# Patient Record
Sex: Male | Born: 1965 | Race: Black or African American | Hispanic: No | Marital: Single | Smoking: Never smoker
Health system: Southern US, Community
[De-identification: ages and names within clinical notes are randomized; demographics above are authoritative.]

## PROBLEM LIST (undated history)

## (undated) DIAGNOSIS — M25562 Pain in left knee: Secondary | ICD-10-CM

## (undated) DIAGNOSIS — H5440 Blindness, one eye, unspecified eye: Secondary | ICD-10-CM

## (undated) DIAGNOSIS — I10 Essential (primary) hypertension: Secondary | ICD-10-CM

## (undated) DIAGNOSIS — I471 Supraventricular tachycardia, unspecified: Secondary | ICD-10-CM

## (undated) DIAGNOSIS — N186 End stage renal disease: Secondary | ICD-10-CM

## (undated) DIAGNOSIS — E119 Type 2 diabetes mellitus without complications: Secondary | ICD-10-CM

## (undated) DIAGNOSIS — N62 Hypertrophy of breast: Secondary | ICD-10-CM

## (undated) DIAGNOSIS — R0600 Dyspnea, unspecified: Secondary | ICD-10-CM

## (undated) DIAGNOSIS — I4891 Unspecified atrial fibrillation: Secondary | ICD-10-CM

## (undated) DIAGNOSIS — N189 Chronic kidney disease, unspecified: Secondary | ICD-10-CM

## (undated) HISTORY — DX: End stage renal disease: N18.6

## (undated) HISTORY — PX: HERNIA REPAIR: SHX51

## (undated) HISTORY — PX: SINOSCOPY: SHX187

## (undated) HISTORY — DX: Supraventricular tachycardia: I47.1

## (undated) HISTORY — DX: Unspecified atrial fibrillation: I48.91

## (undated) HISTORY — DX: Hypertrophy of breast: N62

## (undated) HISTORY — DX: Chronic kidney disease, unspecified: N18.9

## (undated) HISTORY — DX: Pain in left knee: M25.562

## (undated) HISTORY — DX: Supraventricular tachycardia, unspecified: I47.10

## (undated) HISTORY — PX: EYE SURGERY: SHX253

## (undated) HISTORY — DX: Dyspnea, unspecified: R06.00

## (undated) HISTORY — DX: Type 2 diabetes mellitus without complications: E11.9

## (undated) HISTORY — PX: ADRENALECTOMY: SHX876

---

## 2011-10-21 ENCOUNTER — Other Ambulatory Visit (INDEPENDENT_AMBULATORY_CARE_PROVIDER_SITE_OTHER): Payer: Self-pay | Admitting: Otolaryngology

## 2011-10-30 ENCOUNTER — Ambulatory Visit
Admission: RE | Admit: 2011-10-30 | Discharge: 2011-10-30 | Disposition: A | Discharge status: Home / Self Care | Payer: PRIVATE HEALTH INSURANCE | Source: Ambulatory Visit | Attending: Otolaryngology | Admitting: Otolaryngology

## 2011-11-13 ENCOUNTER — Encounter (HOSPITAL_BASED_OUTPATIENT_CLINIC_OR_DEPARTMENT_OTHER): Payer: Self-pay | Admitting: *Deleted

## 2011-11-13 NOTE — Progress Notes (Signed)
Pt needs bmet-ekg To bring all meds and nebu meds-do neb tx hs prior-

## 2011-11-16 ENCOUNTER — Encounter (HOSPITAL_BASED_OUTPATIENT_CLINIC_OR_DEPARTMENT_OTHER): Payer: Self-pay | Admitting: Anesthesiology

## 2011-11-16 ENCOUNTER — Encounter (HOSPITAL_BASED_OUTPATIENT_CLINIC_OR_DEPARTMENT_OTHER): Payer: Self-pay | Admitting: *Deleted

## 2011-11-16 ENCOUNTER — Encounter (HOSPITAL_BASED_OUTPATIENT_CLINIC_OR_DEPARTMENT_OTHER)
Admission: RE | Disposition: A | Discharge status: Home / Self Care | Payer: Self-pay | Source: Ambulatory Visit | Attending: Otolaryngology

## 2011-11-16 ENCOUNTER — Ambulatory Visit (HOSPITAL_BASED_OUTPATIENT_CLINIC_OR_DEPARTMENT_OTHER)
Admission: RE | Admit: 2011-11-16 | Discharge: 2011-11-16 | Disposition: A | Discharge status: Home / Self Care | Payer: PRIVATE HEALTH INSURANCE | Source: Ambulatory Visit | Attending: Otolaryngology | Admitting: Otolaryngology

## 2011-11-16 ENCOUNTER — Other Ambulatory Visit: Payer: Self-pay

## 2011-11-16 DIAGNOSIS — Z01810 Encounter for preprocedural cardiovascular examination: Secondary | ICD-10-CM | POA: Insufficient documentation

## 2011-11-16 DIAGNOSIS — Z5309 Procedure and treatment not carried out because of other contraindication: Secondary | ICD-10-CM | POA: Insufficient documentation

## 2011-11-16 HISTORY — DX: Blindness, one eye, unspecified eye: H54.40

## 2011-11-16 HISTORY — DX: Essential (primary) hypertension: I10

## 2011-11-16 LAB — POCT I-STAT, CHEM 8
BUN: 16 mg/dL (ref 6–23)
Calcium, Ion: 1.2 mmol/L (ref 1.12–1.32)
Creatinine, Ser: 1.2 mg/dL (ref 0.50–1.35)
TCO2: 28 mmol/L (ref 0–100)

## 2011-11-16 SURGERY — ETHMOIDECTOMY
Anesthesia: General

## 2011-11-16 MED ORDER — SCOPOLAMINE 1 MG/3DAYS TD PT72: 1.0000 | MEDICATED_PATCH | Freq: Once | TRANSDERMAL | Status: DC

## 2011-11-16 MED ORDER — LACTATED RINGERS IV SOLN: INTRAVENOUS | Status: DC

## 2011-11-16 SURGICAL SUPPLY — 49 items
ATTRACTOMAT 16X20 MAGNETIC DRP (DRAPES) IMPLANT
BLADE RAD40 ROTATE 4M 4 5PK (BLADE) IMPLANT
BLADE RAD60 ROTATE M4 4 5PK (BLADE) IMPLANT
BLADE ROTATE RAD12 5PK M4 4MM (BLADE) IMPLANT
BLADE TRICUT ROTATE M4 4 5PK (BLADE) IMPLANT
BUR HS RAD FRONTAL 3 (BURR) IMPLANT
CANISTER SUC SOCK COL 7 IN (MISCELLANEOUS) ×6 IMPLANT
CANISTER SUCTION 1200CC (MISCELLANEOUS) ×3 IMPLANT
CATH SINUS BALLN 7X16 (CATHETERS) IMPLANT
CATH SINUS BALLN RELIEV 6X16 (SINUPLASTY) IMPLANT
CATH SINUS GUIDE F-70 (CATHETERS) IMPLANT
CATH SINUS GUIDE M/110 (CATHETERS) IMPLANT
CATH SINUS IRRIGATION 2.0 (CATHETERS) IMPLANT
CLOTH BEACON ORANGE TIMEOUT ST (SAFETY) ×3 IMPLANT
COAGULATOR SUCT 8FR VV (MISCELLANEOUS) IMPLANT
COAGULATOR SUCT SWTCH 10FR 6 (ELECTROSURGICAL) IMPLANT
DECANTER SPIKE VIAL GLASS SM (MISCELLANEOUS) IMPLANT
DEVICE INFLATION 20/61 (MISCELLANEOUS) IMPLANT
DRSG NASAL KENNEDY LMNT 8CM (GAUZE/BANDAGES/DRESSINGS) IMPLANT
ELECT REM PT RETURN 9FT ADLT (ELECTROSURGICAL)
ELECTRODE REM PT RTRN 9FT ADLT (ELECTROSURGICAL) IMPLANT
GLOVE BIO SURGEON STRL SZ7.5 (GLOVE) ×3 IMPLANT
GOWN PREVENTION PLUS XLARGE (GOWN DISPOSABLE) ×3 IMPLANT
HANDLE SINUS GUIDE LP (INSTRUMENTS) IMPLANT
HANDPIECE HYDRODEBRIDER FRONT (BLADE) IMPLANT
HEMOSTAT SURGICEL 2X14 (HEMOSTASIS) IMPLANT
IV NS 500ML (IV SOLUTION) ×2
IV NS 500ML BAXH (IV SOLUTION) ×2 IMPLANT
NDL SPNL 25GX3.5 QUINCKE BL (NEEDLE) IMPLANT
NEEDLE 27GAX1X1/2 (NEEDLE) ×3 IMPLANT
NEEDLE SPNL 25GX3.5 QUINCKE BL (NEEDLE) IMPLANT
NS IRRIG 1000ML POUR BTL (IV SOLUTION) IMPLANT
PACK BASIN DAY SURGERY FS (CUSTOM PROCEDURE TRAY) ×3 IMPLANT
PACK ENT DAY SURGERY (CUSTOM PROCEDURE TRAY) ×3 IMPLANT
SET EXT MALE ROTATING LL 32IN (MISCELLANEOUS) IMPLANT
SET IV EXT TUBING FEMALE 31 (MISCELLANEOUS) IMPLANT
SLEEVE SCD COMPRESS KNEE MED (MISCELLANEOUS) IMPLANT
SOLUTION BUTLER CLEAR DIP (MISCELLANEOUS) ×6 IMPLANT
SPLINT NASAL DOYLE BI-VL (GAUZE/BANDAGES/DRESSINGS) IMPLANT
SPONGE GAUZE 2X2 8PLY STRL LF (GAUZE/BANDAGES/DRESSINGS) ×3 IMPLANT
SPONGE NEURO XRAY DETECT 1X3 (DISPOSABLE) ×3 IMPLANT
SUT ETHILON 3 0 PS 1 (SUTURE) IMPLANT
SUT PLAIN 4 0 ~~LOC~~ 1 (SUTURE) IMPLANT
SYSTEM HYDRODEBRIDER (MISCELLANEOUS) IMPLANT
SYSTEM RELIEVA LUMA ILLUM (SINUPLASTY) IMPLANT
TOWEL OR 17X24 6PK STRL BLUE (TOWEL DISPOSABLE) ×3 IMPLANT
TUBE CONNECTING 20X1/4 (TUBING) ×3 IMPLANT
WATER STERILE IRR 1000ML POUR (IV SOLUTION) ×3 IMPLANT
YANKAUER SUCT BULB TIP NO VENT (SUCTIONS) IMPLANT

## 2011-11-16 NOTE — Progress Notes (Signed)
Pt. Surgery cancelled due to k level of 2.8. DR. Teoh notified. Iv d/c with tip intact. Site unremarkable. D/c with sister to home

## 2011-11-16 NOTE — Anesthesia Preprocedure Evaluation (Addendum)
Anesthesia Evaluation  Patient identified by MRN, date of birth, ID band Patient awake    Reviewed: Allergy & Precautions, H&P , NPO status , Patient's Chart, lab work & pertinent test results  Airway Mallampati: II TM Distance: >3 FB Neck ROM: full    Dental  (+) Teeth Intact and Chipped   Pulmonary asthma (rarely uses inhalers) ,  clear to auscultation  Pulmonary exam normal       Cardiovascular hypertension (took meds this am), On Medications regular Normal Treadmill test 4-5 years ago-patient reports was fine   Neuro/Psych    GI/Hepatic   Endo/Other  Patient reports he wastes potassium  Renal/GU      Musculoskeletal   Abdominal   Peds  Hematology   Anesthesia Other Findings   Reproductive/Obstetrics                           Anesthesia Physical Anesthesia Plan  ASA: II  Anesthesia Plan:    Post-op Pain Management:    Induction:   Airway Management Planned:   Additional Equipment:   Intra-op Plan:   Post-operative Plan:   Informed Consent: I have reviewed the patients History and Physical, chart, labs and discussed the procedure including the risks, benefits and alternatives for the proposed anesthesia with the patient or authorized representative who has indicated his/her understanding and acceptance.     Plan Discussed with: Surgeon  Anesthesia Plan Comments: (K= 2.8, will postpone pending potassium correction.  Patient to return to his internal medicine doctor.)       Anesthesia Quick Evaluation

## 2012-04-28 DIAGNOSIS — E269 Hyperaldosteronism, unspecified: Secondary | ICD-10-CM | POA: Insufficient documentation

## 2012-07-06 DIAGNOSIS — E876 Hypokalemia: Secondary | ICD-10-CM | POA: Insufficient documentation

## 2012-07-06 DIAGNOSIS — J329 Chronic sinusitis, unspecified: Secondary | ICD-10-CM | POA: Insufficient documentation

## 2012-08-19 DIAGNOSIS — E2601 Conn's syndrome: Secondary | ICD-10-CM | POA: Insufficient documentation

## 2012-08-19 DIAGNOSIS — E2609 Other primary hyperaldosteronism: Secondary | ICD-10-CM | POA: Insufficient documentation

## 2012-09-15 DIAGNOSIS — H02429 Myogenic ptosis of unspecified eyelid: Secondary | ICD-10-CM | POA: Insufficient documentation

## 2012-09-15 DIAGNOSIS — H01009 Unspecified blepharitis unspecified eye, unspecified eyelid: Secondary | ICD-10-CM | POA: Insufficient documentation

## 2012-09-15 DIAGNOSIS — H5440 Blindness, one eye, unspecified eye: Secondary | ICD-10-CM | POA: Insufficient documentation

## 2012-09-30 ENCOUNTER — Other Ambulatory Visit (INDEPENDENT_AMBULATORY_CARE_PROVIDER_SITE_OTHER): Payer: Self-pay | Admitting: Otolaryngology

## 2012-09-30 DIAGNOSIS — J329 Chronic sinusitis, unspecified: Secondary | ICD-10-CM

## 2012-10-06 ENCOUNTER — Ambulatory Visit
Admission: RE | Admit: 2012-10-06 | Discharge: 2012-10-06 | Disposition: A | Discharge status: Home / Self Care | Payer: PRIVATE HEALTH INSURANCE | Source: Ambulatory Visit | Attending: Otolaryngology | Admitting: Otolaryngology

## 2012-10-06 DIAGNOSIS — J329 Chronic sinusitis, unspecified: Secondary | ICD-10-CM

## 2012-10-12 NOTE — Progress Notes (Signed)
Pt surgry cancelled due to low k-he says he is going to the [facility] to get lab-will fax here-he is very hard to talk to-short tempered. Has been kicked out of several medical practices-says he goes to [facility].they are supposed to fax lab here-preop reviewed-to bring all meds and neb dos

## 2012-10-17 ENCOUNTER — Encounter (HOSPITAL_BASED_OUTPATIENT_CLINIC_OR_DEPARTMENT_OTHER): Payer: Self-pay | Admitting: Anesthesiology

## 2012-10-17 ENCOUNTER — Encounter (HOSPITAL_BASED_OUTPATIENT_CLINIC_OR_DEPARTMENT_OTHER)
Admission: RE | Disposition: A | Discharge status: Home / Self Care | Payer: Self-pay | Source: Ambulatory Visit | Attending: Otolaryngology

## 2012-10-17 ENCOUNTER — Ambulatory Visit (HOSPITAL_BASED_OUTPATIENT_CLINIC_OR_DEPARTMENT_OTHER): Payer: PRIVATE HEALTH INSURANCE | Admitting: Anesthesiology

## 2012-10-17 ENCOUNTER — Encounter (HOSPITAL_BASED_OUTPATIENT_CLINIC_OR_DEPARTMENT_OTHER): Payer: Self-pay

## 2012-10-17 ENCOUNTER — Ambulatory Visit (HOSPITAL_BASED_OUTPATIENT_CLINIC_OR_DEPARTMENT_OTHER)
Admission: RE | Admit: 2012-10-17 | Discharge: 2012-10-17 | Disposition: A | Discharge status: Home / Self Care | Payer: PRIVATE HEALTH INSURANCE | Source: Ambulatory Visit | Attending: Otolaryngology | Admitting: Otolaryngology

## 2012-10-17 DIAGNOSIS — J321 Chronic frontal sinusitis: Secondary | ICD-10-CM | POA: Insufficient documentation

## 2012-10-17 DIAGNOSIS — Z9889 Other specified postprocedural states: Secondary | ICD-10-CM

## 2012-10-17 DIAGNOSIS — J338 Other polyp of sinus: Secondary | ICD-10-CM | POA: Insufficient documentation

## 2012-10-17 DIAGNOSIS — J322 Chronic ethmoidal sinusitis: Secondary | ICD-10-CM | POA: Insufficient documentation

## 2012-10-17 DIAGNOSIS — J320 Chronic maxillary sinusitis: Secondary | ICD-10-CM | POA: Insufficient documentation

## 2012-10-17 DIAGNOSIS — J45909 Unspecified asthma, uncomplicated: Secondary | ICD-10-CM | POA: Insufficient documentation

## 2012-10-17 DIAGNOSIS — I10 Essential (primary) hypertension: Secondary | ICD-10-CM | POA: Insufficient documentation

## 2012-10-17 HISTORY — PX: SINUS ENDO WITH FUSION: SHX5329

## 2012-10-17 SURGERY — SURGERY, PARANASAL SINUS, ENDOSCOPIC, WITH NASAL SEPTOPLASTY, TURBINOPLASTY, AND MAXILLARY SINUSOTOMY
Anesthesia: General | Site: Nose | Laterality: Bilateral | Wound class: Clean Contaminated

## 2012-10-17 MED ORDER — NEOSTIGMINE METHYLSULFATE 1 MG/ML IJ SOLN
INTRAMUSCULAR | Status: DC | PRN
  Administered 2012-10-17: 3.5 mg via INTRAVENOUS

## 2012-10-17 MED ORDER — OXYCODONE-ACETAMINOPHEN 5-325 MG PO TABS: 1.0000 | ORAL_TABLET | ORAL | Status: DC | PRN

## 2012-10-17 MED ORDER — ONDANSETRON HCL 4 MG/2ML IJ SOLN: 4.0000 mg | Freq: Once | INTRAMUSCULAR | Status: DC | PRN

## 2012-10-17 MED ORDER — AMOXICILLIN-POT CLAVULANATE 875-125 MG PO TABS: 1.0000 | ORAL_TABLET | Freq: Two times a day (BID) | ORAL | Status: AC

## 2012-10-17 MED ORDER — LIDOCAINE-EPINEPHRINE 1 %-1:100000 IJ SOLN
INTRAMUSCULAR | Status: DC | PRN
  Administered 2012-10-17: 2 mL

## 2012-10-17 MED ORDER — ROCURONIUM BROMIDE 100 MG/10ML IV SOLN
INTRAVENOUS | Status: DC | PRN
  Administered 2012-10-17: 5 mg via INTRAVENOUS
  Administered 2012-10-17: 20 mg via INTRAVENOUS

## 2012-10-17 MED ORDER — OXYCODONE HCL 5 MG/5ML PO SOLN: 5.0000 mg | Freq: Once | ORAL | Status: AC | PRN

## 2012-10-17 MED ORDER — HYDROMORPHONE HCL PF 1 MG/ML IJ SOLN
0.2500 mg | INTRAMUSCULAR | Status: DC | PRN
  Administered 2012-10-17 (×2): 0.5 mg via INTRAVENOUS

## 2012-10-17 MED ORDER — FENTANYL CITRATE 0.05 MG/ML IJ SOLN
INTRAMUSCULAR | Status: DC | PRN
  Administered 2012-10-17: 100 ug via INTRAVENOUS
  Administered 2012-10-17 (×2): 50 ug via INTRAVENOUS

## 2012-10-17 MED ORDER — CEFAZOLIN SODIUM-DEXTROSE 2-3 GM-% IV SOLR
INTRAVENOUS | Status: DC | PRN
  Administered 2012-10-17: 2 g via INTRAVENOUS

## 2012-10-17 MED ORDER — ONDANSETRON HCL 4 MG/2ML IJ SOLN
INTRAMUSCULAR | Status: DC | PRN
  Administered 2012-10-17: 4 mg via INTRAVENOUS

## 2012-10-17 MED ORDER — PREDNISONE (PAK) 10 MG PO TABS: 10.0000 mg | ORAL_TABLET | Freq: Every day | ORAL | Status: AC

## 2012-10-17 MED ORDER — SUCCINYLCHOLINE CHLORIDE 20 MG/ML IJ SOLN
INTRAMUSCULAR | Status: DC | PRN
  Administered 2012-10-17: 50 mg via INTRAVENOUS
  Administered 2012-10-17: 100 mg via INTRAVENOUS

## 2012-10-17 MED ORDER — DEXAMETHASONE SODIUM PHOSPHATE 4 MG/ML IJ SOLN
INTRAMUSCULAR | Status: DC | PRN
  Administered 2012-10-17: 10 mg via INTRAVENOUS
  Administered 2012-10-17: 5 mg via INTRAVENOUS

## 2012-10-17 MED ORDER — PROPOFOL 10 MG/ML IV BOLUS
INTRAVENOUS | Status: DC | PRN
  Administered 2012-10-17: 200 mg via INTRAVENOUS
  Administered 2012-10-17 (×2): 100 mg via INTRAVENOUS
  Administered 2012-10-17 (×2): 50 mg via INTRAVENOUS

## 2012-10-17 MED ORDER — LACTATED RINGERS IV SOLN
INTRAVENOUS | Status: DC
  Administered 2012-10-17 (×2): via INTRAVENOUS

## 2012-10-17 MED ORDER — OXYMETAZOLINE HCL 0.05 % NA SOLN
NASAL | Status: DC | PRN
  Administered 2012-10-17 (×2): 1 via NASAL

## 2012-10-17 MED ORDER — SCOPOLAMINE 1 MG/3DAYS TD PT72: 1.0000 | MEDICATED_PATCH | TRANSDERMAL | Status: DC

## 2012-10-17 MED ORDER — OXYCODONE HCL 5 MG PO TABS
5.0000 mg | ORAL_TABLET | Freq: Once | ORAL | Status: AC | PRN
  Administered 2012-10-17: 5 mg via ORAL

## 2012-10-17 MED ORDER — LIDOCAINE HCL (CARDIAC) 20 MG/ML IV SOLN
INTRAVENOUS | Status: DC | PRN
  Administered 2012-10-17: 100 mg via INTRAVENOUS

## 2012-10-17 MED ORDER — MIDAZOLAM HCL 5 MG/5ML IJ SOLN
INTRAMUSCULAR | Status: DC | PRN
  Administered 2012-10-17: 2 mg via INTRAVENOUS

## 2012-10-17 MED ORDER — GLYCOPYRROLATE 0.2 MG/ML IJ SOLN
INTRAMUSCULAR | Status: DC | PRN
  Administered 2012-10-17: .4 mg via INTRAVENOUS

## 2012-10-17 SURGICAL SUPPLY — 61 items
BLADE ROTATE RAD 12 4 M4 (BLADE) IMPLANT
BLADE ROTATE RAD 40 4 M4 (BLADE) IMPLANT
BLADE ROTATE TRICUT 4X13 M4 (BLADE) ×2 IMPLANT
BLADE TRICUT ROTATE M4 4 5PK (BLADE) IMPLANT
BUR HS RAD FRONTAL 3 (BURR) IMPLANT
CANISTER SUC SOCK COL 7 IN (MISCELLANEOUS) ×3 IMPLANT
CANISTER SUCTION 1200CC (MISCELLANEOUS) ×2 IMPLANT
CATH SINUS BALLN 7X16 (CATHETERS) IMPLANT
CATH SINUS BALLN RELIEV 6X16 (SINUPLASTY) IMPLANT
CATH SINUS GUIDE F-70 (CATHETERS) IMPLANT
CATH SINUS GUIDE M/110 (CATHETERS) IMPLANT
CATH SINUS IRRIGATION 2.0 (CATHETERS) IMPLANT
CLOTH BEACON ORANGE TIMEOUT ST (SAFETY) ×2 IMPLANT
COAGULATOR SUCT 8FR VV (MISCELLANEOUS) IMPLANT
DECANTER SPIKE VIAL GLASS SM (MISCELLANEOUS) IMPLANT
DEVICE INFLATION 20/61 (MISCELLANEOUS) IMPLANT
DRAPE SURG 17X23 STRL (DRAPES) ×2 IMPLANT
DRSG NASAL KENNEDY LMNT 8CM (GAUZE/BANDAGES/DRESSINGS) IMPLANT
DRSG NASOPORE 8CM (GAUZE/BANDAGES/DRESSINGS) ×1 IMPLANT
DRSG TELFA 3X8 NADH (GAUZE/BANDAGES/DRESSINGS) IMPLANT
ELECT REM PT RETURN 9FT ADLT (ELECTROSURGICAL)
ELECTRODE REM PT RTRN 9FT ADLT (ELECTROSURGICAL) IMPLANT
GLOVE BIO SURGEON STRL SZ7.5 (GLOVE) ×2 IMPLANT
GLOVE SKINSENSE NS SZ7.0 (GLOVE) ×1
GLOVE SKINSENSE STRL SZ7.0 (GLOVE) IMPLANT
GOWN PREVENTION PLUS XLARGE (GOWN DISPOSABLE) ×3 IMPLANT
HANDLE SINUS GUIDE LP (INSTRUMENTS) IMPLANT
HANDPIECE HYDRODEBRIDER FRONT (BLADE) IMPLANT
HEMOSTAT SURGICEL 2X14 (HEMOSTASIS) IMPLANT
IV NS 1000ML (IV SOLUTION)
IV NS 1000ML BAXH (IV SOLUTION) IMPLANT
IV NS 500ML (IV SOLUTION) ×2
IV NS 500ML BAXH (IV SOLUTION) ×2 IMPLANT
NDL SPNL 25GX3.5 QUINCKE BL (NEEDLE) IMPLANT
NEEDLE 27GAX1X1/2 (NEEDLE) ×2 IMPLANT
NEEDLE SPNL 25GX3.5 QUINCKE BL (NEEDLE) IMPLANT
NS IRRIG 1000ML POUR BTL (IV SOLUTION) ×1 IMPLANT
PACK BASIN DAY SURGERY FS (CUSTOM PROCEDURE TRAY) ×2 IMPLANT
PACK ENT DAY SURGERY (CUSTOM PROCEDURE TRAY) ×2 IMPLANT
PAD DRESSING TELFA 3X8 NADH (GAUZE/BANDAGES/DRESSINGS) IMPLANT
PAD ENT ADHESIVE 25PK (MISCELLANEOUS) ×2 IMPLANT
SHEATH ENDOSCRUB 0 DEG (SHEATH) IMPLANT
SHEATH ENDOSCRUB 30 DEG (SHEATH) IMPLANT
SLEEVE SCD COMPRESS KNEE MED (MISCELLANEOUS) ×1 IMPLANT
SOLUTION BUTLER CLEAR DIP (MISCELLANEOUS) ×3 IMPLANT
SPLINT NASAL DOYLE BI-VL (GAUZE/BANDAGES/DRESSINGS) IMPLANT
SPONGE GAUZE 2X2 8PLY STRL LF (GAUZE/BANDAGES/DRESSINGS) ×2 IMPLANT
SPONGE NEURO XRAY DETECT 1X3 (DISPOSABLE) ×3 IMPLANT
SUT ETHILON 3 0 PS 1 (SUTURE) IMPLANT
SUT PLAIN 4 0 ~~LOC~~ 1 (SUTURE) IMPLANT
SYR 3ML 23GX1 SAFETY (SYRINGE) IMPLANT
SYSTEM HYDRODEBRIDER (MISCELLANEOUS) IMPLANT
SYSTEM RELIEVA LUMA ILLUM (SINUPLASTY) IMPLANT
TOWEL OR 17X24 6PK STRL BLUE (TOWEL DISPOSABLE) ×2 IMPLANT
TRACKER ENT INSTRUMENT (MISCELLANEOUS) ×2 IMPLANT
TRACKER ENT PATIENT (MISCELLANEOUS) ×2 IMPLANT
TUBE CONNECTING 20X1/4 (TUBING) ×2 IMPLANT
TUBE SALEM SUMP 16 FR W/ARV (TUBING) IMPLANT
TUBING STRAIGHTSHOT EPS 5PK (TUBING) ×2 IMPLANT
WATER STERILE IRR 1000ML POUR (IV SOLUTION) ×1 IMPLANT
YANKAUER SUCT BULB TIP NO VENT (SUCTIONS) ×2 IMPLANT

## 2012-10-17 NOTE — Anesthesia Preprocedure Evaluation (Addendum)
Anesthesia Evaluation  Patient identified by MRN, date of birth, ID band Patient awake    Reviewed: Allergy & Precautions, H&P , NPO status , Patient's Chart, lab work & pertinent test results  Airway Mallampati: I TM Distance: >3 FB Neck ROM: Full    Dental  (+) Teeth Intact and Dental Advisory Given   Pulmonary asthma ,    + wheezing (Left chest wheezing, in no distress.)      Cardiovascular hypertension, Pt. on medications Rhythm:Regular Rate:Normal     Neuro/Psych    GI/Hepatic   Endo/Other  Morbid obesity  Renal/GU      Musculoskeletal   Abdominal   Peds  Hematology   Anesthesia Other Findings   Reproductive/Obstetrics                         Anesthesia Physical Anesthesia Plan  ASA: III  Anesthesia Plan: General   Post-op Pain Management:    Induction: Intravenous  Airway Management Planned: Oral ETT  Additional Equipment:   Intra-op Plan:   Post-operative Plan: Extubation in OR  Informed Consent: I have reviewed the patients History and Physical, chart, labs and discussed the procedure including the risks, benefits and alternatives for the proposed anesthesia with the patient or authorized representative who has indicated his/her understanding and acceptance.   Dental advisory given  Plan Discussed with: CRNA, Anesthesiologist and Surgeon  Anesthesia Plan Comments:         Anesthesia Quick Evaluation

## 2012-10-17 NOTE — Brief Op Note (Signed)
10/17/2012  10:29 AM  PATIENT:  Gary Buchanan  46 y.o. 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  PROCEDURE:  Procedure(s) (LRB) with comments: 1) Bilateral Endoscopic Frontal Recess Exploration with Polyp Removal 2) Bilateral Endoscopic Total Ethmoidectomy 3) Bilateral Endoscopic Maxillary Antrostomy with Polyp Removal 4) Fusion Stereotactic Computer Guidance  SURGEON:  Surgeon(s) and Role:    * Ascencion Dike, MD - Primary  PHYSICIAN ASSISTANT:   ASSISTANTS: none   ANESTHESIA:   general  EBL:  Total I/O In: 1200 [I.V.:1200] Out: 400 [Blood:400]  BLOOD ADMINISTERED:none  DRAINS: none   LOCAL MEDICATIONS USED:  LIDOCAINE   SPECIMEN:  Source of Specimen:  Bilateral sinus polyps  DISPOSITION OF SPECIMEN:  PATHOLOGY  COUNTS:  YES  TOURNIQUET:  * No tourniquets in log *  DICTATION: .Other Dictation: Dictation Number [id number]  PLAN OF CARE: Discharge to home after PACU  PATIENT DISPOSITION:  PACU - hemodynamically stable.   Delay start of Pharmacological VTE agent (>24hrs) due to surgical blood loss or risk of bleeding: not applicable

## 2012-10-17 NOTE — Transfer of Care (Signed)
Immediate Anesthesia Transfer of Care Note  Patient: Gary Buchanan  Procedure(s) Performed: Procedure(s) (LRB) with comments: SINUS ENDO WITH FUSION (Bilateral) - Bilateral Ethmoidectomy,  Bilateral Maxillary Antrostomy, Bilateral Frontal Recess Exploration,   W/ Fusion Protocol  Patient Location: PACU  Anesthesia Type: General  Level of Consciousness: awake  Airway & Oxygen Therapy: Patient Spontanous Breathing and Patient connected to face mask oxygen  Post-op Assessment: Report given to PACU RN and Post -op Vital signs reviewed and stable  Post vital signs: Reviewed and stable  Complications: No apparent anesthesia complications

## 2012-10-17 NOTE — Anesthesia Postprocedure Evaluation (Signed)
  Anesthesia Post-op Note  Patient: Gary Buchanan  Procedure(s) Performed: Procedure(s) (LRB) with comments: SINUS ENDO WITH FUSION (Bilateral) - Bilateral Ethmoidectomy,  Bilateral Maxillary Antrostomy, Bilateral Frontal Recess Exploration,   W/ Fusion Protocol  Patient Location: PACU  Anesthesia Type: General  Level of Consciousness: awake, alert  and oriented  Airway and Oxygen Therapy: Patient Spontanous Breathing and Patient connected to face mask oxygen  Post-op Pain: mild  Post-op Assessment: Post-op Vital signs reviewed  Post-op Vital Signs: Reviewed  Complications: No apparent anesthesia complications

## 2012-10-17 NOTE — H&P (Signed)
  H&P Update  Pt's original H&P dated 09/30/12 reviewed and placed in chart (to be scanned).  I personally examined the patient today.  No change in health. Proceed with bilateral endoscopic sinus surgery.

## 2012-10-17 NOTE — Progress Notes (Signed)
Transportation service arrived to pick pt up to bring to mothers house as per pt. Unable to contact mother but able to contact sister. Sister stated she will be assisting to take care of pt. at home and that  discharge instructions could be reviewed  with her. Discharge instructions reviewed with sister Ginette Pitman verbalized understanding. Pt. discharged via transportation service.

## 2012-10-17 NOTE — Anesthesia Procedure Notes (Signed)
Procedure Name: Intubation Date/Time: 10/17/2012 8:44 AM Performed by: Lieutenant Diego Pre-anesthesia Checklist: Patient identified, Emergency Drugs available, Suction available and Patient being monitored Patient Re-evaluated:Patient Re-evaluated prior to inductionOxygen Delivery Method: Circle System Utilized Preoxygenation: Pre-oxygenation with 100% oxygen Intubation Type: IV induction Ventilation: Mask ventilation without difficulty Laryngoscope Size: Miller and 2 Grade View: Grade I Tube type: Oral Tube size: 8.0 mm Number of attempts: 1 Airway Equipment and Method: stylet and oral airway Placement Confirmation: ETT inserted through vocal cords under direct vision,  positive ETCO2 and breath sounds checked- equal and bilateral Secured at: 23 cm Tube secured with: Tape Dental Injury: Teeth and Oropharynx as per pre-operative assessment

## 2012-10-18 NOTE — Op Note (Addendum)
NAME:  Gary Buchanan, Gary Buchanan NO.:  [id number]  MEDICAL RECORD NO.:  [id number]  LOCATION:                               FACILITY:  [facility]  PHYSICIAN:  Leta Baptist, MD                 DATE OF BIRTH:  DATE OF PROCEDURE:  10/17/2012 DATE OF DISCHARGE:  10/17/2012                              OPERATIVE REPORT   SURGEON:  Leta Baptist, MD  PREOPERATIVE DIAGNOSES: 1. Bilateral chronic maxillary, frontal, and ethmoid sinusitis 2. Bilateral nasal and sinus polyposis.  POSTOPERATIVE DIAGNOSES: 1. Bilateral chronic maxillary, frontal, and ethmoid sinusitis. 2. Bilateral nasal and sinus polyposis.  PROCEDURE PERFORMED: 1. Bilateral endoscopic frontal recess exploration with polyp removal. 2. Bilateral endoscopic total ethmoidectomy 3. Bilateral endoscopic maxillary antrostomy with polyp removal. 4. Fusion stereotactic computer guidance.  ANESTHESIA:  General endotracheal tube anesthesia.  COMPLICATIONS:  None.  ESTIMATED BLOOD LOSS:  400 mL.  INDICATION FOR PROCEDURE:  The patient is a 46 year old male with a history of bilateral chronic rhinosinusitis and nasal polyposis.  He previously underwent bilateral endoscopic sinus surgery twice in [facility].  Since his last surgery, the patient has developed more sinus infections and recurrent nasal polyps.  His CT scan shows a significant opacification of his nasal cavity, bilateral maxillary, ethmoid, and frontal sinuses.  On examination, his nasal cavities were nearly completely obstructed with nasal polyps.  The patient complains of significant difficulty breathing through his nose.  He has been an habitual mouth breather.  The patient was previously treated with maximal medical therapy, including antibiotics, systemic and topical steroids, antihistamine, and decongestants, without significant improvement in his symptoms.  Based on the above findings, the decision was made for the patient to undergo revision, endoscopic sinus  surgery. The risks, benefits, alternatives, and details of the procedures were discussed with the patient.  Questions were invited and answered. Informed consent was obtained.  DESCRIPTION:  The patient was taken to the operating room and placed supine on the operating table.  General endotracheal tube anesthesia was administered by the anesthesiologist.  Preop IV antibiotics and Decadron were given.  The patient was positioned and prepped and draped in the standard fashion for nasal surgery.  Pledgets soaked with Afrin were placed in both nasal cavities for vasoconstriction.  A fusion computer guidance system was then set up in a  standard fashion.  His fiduciary landmark was placed on the forehead.  The registration was performed without any difficulty.  The navigation system was functional throughout the case.  Attention was first focused on the left side.  The pledgets were removed.  Endoscopic evaluation on the left nasal cavity revealed multiple large polyps, nearly completely obstructing the nasal cavity. A 1% lidocaine with 1:100, 000 epinephrine was injected onto the lateral nasal wall and nasal septum.  The polypoid tissue was removed in a piecemeal fashion using Tru-Cut forceps, Blakesley forceps, microdebrider, and backbiter.  The maxillary antrum was then enlarged with backbiter and microdebriders.  A polypoid tissue was also removed from the maxillary sinus.  Mucoid drainage was noted from the maxillary sinus.  The maxillary sinus was copiously irrigated.  Attention was then focused on the ethmoid cavities.  Polypoid tissue was also removed from both anterior and the posterior ethmoid cavities.  The superior nasal wall and lamina papyracea were all noted to be intact.  The frontal recess was then probed with a sinus seeker.  The frontal recess was carefully enlarged with a combination of microdebrider and Blakesley forceps.  Mucoid drainage was noted on the left frontal  sinus.  Polypoid tissue was also removed from the frontal recess area.  The frontal sinus was copiously irrigated.  Hemostasis was achieved with packing soaked with Afrin.  Resorbable Nasopore packing was then used to achieve final hemostasis.  The same procedure was then repeated on the right side without exceptions.  Similar findings, including polypoid tissue in the maxillary, ethmoid, and frontal recess were also noted.  All sinus cavities were copiously irrigated.  Another Nasopore packing was placed. The care of the patient was then turned over to the anesthesiologist. The patient was awakened from anesthesia without difficulty.  He was extubated and transferred to the recovery room in good condition.  OPERATIVE FINDINGS:  Polypoid tissue was noted to obstruct both nasal cavities, bilateral maxillary, ethmoid, and frontal sinuses.  SPECIMEN:  Bilateral sinus contents.  FOLLOWUP CARE:  The patient will be discharged home once he is awake and alert.  He will be placed on Augmentin 875 mg p.o. b.i.d. for 7 days, and prednisone Dosepak for 6 days, and Percocet p.r.n. pain.  The patient will follow up in my office in 1 week for her nasal debridement.     Leta Baptist, MD     ST/MEDQ  D:  10/17/2012  T:  10/17/2012  Job:  [id number]

## 2012-10-19 ENCOUNTER — Encounter (HOSPITAL_BASED_OUTPATIENT_CLINIC_OR_DEPARTMENT_OTHER): Payer: Self-pay | Admitting: Otolaryngology

## 2012-11-30 DIAGNOSIS — B0089 Other herpesviral infection: Secondary | ICD-10-CM | POA: Insufficient documentation

## 2012-11-30 DIAGNOSIS — R252 Cramp and spasm: Secondary | ICD-10-CM | POA: Insufficient documentation

## 2012-11-30 DIAGNOSIS — B009 Herpesviral infection, unspecified: Secondary | ICD-10-CM | POA: Insufficient documentation

## 2013-01-27 DIAGNOSIS — H4720 Unspecified optic atrophy: Secondary | ICD-10-CM | POA: Insufficient documentation

## 2013-01-27 DIAGNOSIS — H47099 Other disorders of optic nerve, not elsewhere classified, unspecified eye: Secondary | ICD-10-CM | POA: Insufficient documentation

## 2013-01-27 DIAGNOSIS — H2510 Age-related nuclear cataract, unspecified eye: Secondary | ICD-10-CM | POA: Insufficient documentation

## 2013-01-27 DIAGNOSIS — H40009 Preglaucoma, unspecified, unspecified eye: Secondary | ICD-10-CM | POA: Insufficient documentation

## 2013-01-27 DIAGNOSIS — H469 Unspecified optic neuritis: Secondary | ICD-10-CM | POA: Insufficient documentation

## 2013-01-31 DIAGNOSIS — D631 Anemia in chronic kidney disease: Secondary | ICD-10-CM | POA: Insufficient documentation

## 2013-01-31 DIAGNOSIS — N183 Chronic kidney disease, stage 3 unspecified: Secondary | ICD-10-CM | POA: Insufficient documentation

## 2013-01-31 DIAGNOSIS — N189 Chronic kidney disease, unspecified: Secondary | ICD-10-CM | POA: Insufficient documentation

## 2013-11-10 DIAGNOSIS — M129 Arthropathy, unspecified: Secondary | ICD-10-CM | POA: Insufficient documentation

## 2013-11-10 DIAGNOSIS — M259 Joint disorder, unspecified: Secondary | ICD-10-CM | POA: Insufficient documentation

## 2014-01-09 DIAGNOSIS — N62 Hypertrophy of breast: Secondary | ICD-10-CM | POA: Insufficient documentation

## 2014-07-23 DIAGNOSIS — D3500 Benign neoplasm of unspecified adrenal gland: Secondary | ICD-10-CM | POA: Insufficient documentation

## 2014-10-02 DIAGNOSIS — M1A3720 Chronic gout due to renal impairment, left ankle and foot, without tophus (tophi): Secondary | ICD-10-CM | POA: Insufficient documentation

## 2014-10-02 DIAGNOSIS — M1030 Gout due to renal impairment, unspecified site: Secondary | ICD-10-CM | POA: Insufficient documentation

## 2014-10-02 DIAGNOSIS — J4531 Mild persistent asthma with (acute) exacerbation: Secondary | ICD-10-CM | POA: Insufficient documentation

## 2014-10-02 DIAGNOSIS — M1A3710 Chronic gout due to renal impairment, right ankle and foot, without tophus (tophi): Secondary | ICD-10-CM | POA: Insufficient documentation

## 2014-10-10 DIAGNOSIS — E291 Testicular hypofunction: Secondary | ICD-10-CM | POA: Insufficient documentation

## 2014-10-10 DIAGNOSIS — E221 Hyperprolactinemia: Secondary | ICD-10-CM | POA: Insufficient documentation

## 2014-11-19 DIAGNOSIS — R252 Cramp and spasm: Secondary | ICD-10-CM | POA: Insufficient documentation

## 2014-12-11 DIAGNOSIS — R635 Abnormal weight gain: Secondary | ICD-10-CM | POA: Insufficient documentation

## 2015-01-07 DIAGNOSIS — F39 Unspecified mood [affective] disorder: Secondary | ICD-10-CM | POA: Insufficient documentation

## 2015-01-10 ENCOUNTER — Encounter (HOSPITAL_BASED_OUTPATIENT_CLINIC_OR_DEPARTMENT_OTHER): Payer: Self-pay | Admitting: Otolaryngology

## 2015-03-05 ENCOUNTER — Other Ambulatory Visit: Payer: Self-pay | Admitting: Nephrology

## 2015-03-05 DIAGNOSIS — N183 Chronic kidney disease, stage 3 unspecified: Secondary | ICD-10-CM

## 2015-03-08 ENCOUNTER — Other Ambulatory Visit: Payer: Medicaid Other

## 2015-03-14 ENCOUNTER — Other Ambulatory Visit: Payer: Medicaid Other

## 2015-03-22 ENCOUNTER — Ambulatory Visit
Admission: RE | Admit: 2015-03-22 | Discharge: 2015-03-22 | Disposition: A | Discharge status: Home / Self Care | Payer: Medicare HMO | Source: Ambulatory Visit | Attending: Nephrology | Admitting: Nephrology

## 2015-03-22 DIAGNOSIS — N183 Chronic kidney disease, stage 3 unspecified: Secondary | ICD-10-CM

## 2016-01-28 DIAGNOSIS — H02409 Unspecified ptosis of unspecified eyelid: Secondary | ICD-10-CM | POA: Insufficient documentation

## 2016-01-28 DIAGNOSIS — H524 Presbyopia: Secondary | ICD-10-CM | POA: Insufficient documentation

## 2016-01-28 DIAGNOSIS — H2510 Age-related nuclear cataract, unspecified eye: Secondary | ICD-10-CM | POA: Insufficient documentation

## 2016-01-28 DIAGNOSIS — H35039 Hypertensive retinopathy, unspecified eye: Secondary | ICD-10-CM | POA: Insufficient documentation

## 2016-07-11 DIAGNOSIS — H40023 Open angle with borderline findings, high risk, bilateral: Secondary | ICD-10-CM | POA: Insufficient documentation

## 2017-01-27 DIAGNOSIS — J4541 Moderate persistent asthma with (acute) exacerbation: Secondary | ICD-10-CM | POA: Insufficient documentation

## 2017-02-04 ENCOUNTER — Ambulatory Visit: Payer: Medicare HMO | Admitting: Family Medicine

## 2017-02-04 NOTE — Progress Notes (Deleted)
[facility] [address], [place], [place] [address] [phone]  Date:  02/04/2017   Name:  Gary Buchanan   DOB:  Mar 24, 1966   MRN:  [id number]  PCP:  No primary care provider on file.    Chief Complaint: No chief complaint on file.   History of Present Illness:  Gary Buchanan is a 50 y.o. very pleasant male patient who presents with the following:  Here today as a new patient- he has some outside notes in the system but this is his first visit with [facility] Reviewed notes in [place] - most recent from 2016 in the [facility] system.   History of   There are no active problems to display for this patient.   Past Medical History:  Diagnosis Date  . Asthma   . Blindness of left eye   . Hypertension   . Shortness of breath   . Sinus congestion     Past Surgical History:  Procedure Laterality Date  . SINUS ENDO WITH FUSION  10/17/2012   Procedure: SINUS ENDO WITH FUSION;  Surgeon: Ascencion Dike, MD;  Location: [facility];  Service: ENT;  Laterality: Bilateral;  Bilateral Ethmoidectomy,  Bilateral Maxillary Antrostomy, Bilateral Frontal Recess Exploration,   W/ Fusion Protocol    Social History  Substance Use Topics  . Smoking status: Former Research scientist (life sciences)  . Smokeless tobacco: Not on file  . Alcohol use No    No family history on file.  Allergies  Allergen Reactions  . Ace Inhibitors Swelling    Medication list has been reviewed and updated.  Current Outpatient Prescriptions on File Prior to Visit  Medication Sig Dispense Refill  . albuterol (PROVENTIL HFA;VENTOLIN HFA) 108 (90 BASE) MCG/ACT inhaler Inhale 2 puffs into the lungs every 6 (six) hours as needed.      Marland Kitchen albuterol (PROVENTIL) (2.5 MG/3ML) 0.083% nebulizer solution Take 2.5 mg by nebulization every 6 (six) hours as needed.      Marland Kitchen amLODipine (NORVASC) 10 MG tablet Take 10 mg by mouth daily.      Marland Kitchen amoxicillin (AMOXIL) 500 MG capsule Take 500 mg by  mouth 3 (three) times daily.      . cloNIDine (CATAPRES) 0.3 MG tablet Take 0.3 mg by mouth 3 (three) times daily.      . hydrALAZINE (APRESOLINE) 50 MG tablet Take 50 mg by mouth 3 (three) times daily.      Marland Kitchen oxyCODONE-acetaminophen (PERCOCET) 5-325 MG per tablet Take 1 tablet by mouth every 4 (four) hours as needed for pain. 25 tablet 0  . potassium chloride (KLOR-CON) 10 MEQ CR tablet Take 10 mEq by mouth 3 (three) times daily.       No current facility-administered medications on file prior to visit.     Review of Systems:  ***  Physical Examination: There were no vitals filed for this visit. There were no vitals filed for this visit. There is no height or weight on file to calculate BMI. Ideal Body Weight:    ***  Assessment and Plan: ***  Signed Lamar Blinks, MD

## 2017-03-23 ENCOUNTER — Ambulatory Visit: Payer: Medicare HMO | Admitting: Cardiovascular Disease

## 2017-04-01 ENCOUNTER — Ambulatory Visit: Payer: Medicaid Other | Admitting: Allergy and Immunology

## 2017-04-08 ENCOUNTER — Ambulatory Visit: Payer: Medicaid Other | Admitting: Cardiovascular Disease

## 2017-04-22 ENCOUNTER — Ambulatory Visit: Payer: Medicaid Other | Admitting: Allergy

## 2017-04-22 ENCOUNTER — Institutional Professional Consult (permissible substitution): Payer: Medicare HMO | Admitting: Internal Medicine

## 2017-05-05 ENCOUNTER — Encounter: Payer: Self-pay | Admitting: Cardiology

## 2017-05-05 ENCOUNTER — Ambulatory Visit: Payer: Medicaid Other | Admitting: Cardiology

## 2017-05-25 ENCOUNTER — Ambulatory Visit: Payer: Medicaid Other | Admitting: Cardiology

## 2017-05-31 ENCOUNTER — Ambulatory Visit: Payer: Medicaid Other | Admitting: Family Medicine

## 2017-06-15 ENCOUNTER — Ambulatory Visit: Payer: Medicaid Other | Admitting: Podiatry

## 2017-06-21 ENCOUNTER — Ambulatory Visit (INDEPENDENT_AMBULATORY_CARE_PROVIDER_SITE_OTHER): Payer: 59 | Admitting: Internal Medicine

## 2017-06-21 ENCOUNTER — Encounter: Payer: Self-pay | Admitting: Internal Medicine

## 2017-06-21 DIAGNOSIS — M109 Gout, unspecified: Secondary | ICD-10-CM | POA: Insufficient documentation

## 2017-06-21 DIAGNOSIS — G4733 Obstructive sleep apnea (adult) (pediatric): Secondary | ICD-10-CM | POA: Insufficient documentation

## 2017-06-21 DIAGNOSIS — J45909 Unspecified asthma, uncomplicated: Secondary | ICD-10-CM | POA: Insufficient documentation

## 2017-06-21 DIAGNOSIS — I10 Essential (primary) hypertension: Secondary | ICD-10-CM | POA: Insufficient documentation

## 2017-06-21 DIAGNOSIS — B009 Herpesviral infection, unspecified: Secondary | ICD-10-CM | POA: Insufficient documentation

## 2017-06-21 DIAGNOSIS — J4540 Moderate persistent asthma, uncomplicated: Secondary | ICD-10-CM | POA: Diagnosis not present

## 2017-06-21 DIAGNOSIS — E118 Type 2 diabetes mellitus with unspecified complications: Secondary | ICD-10-CM | POA: Insufficient documentation

## 2017-06-21 DIAGNOSIS — I4891 Unspecified atrial fibrillation: Secondary | ICD-10-CM | POA: Insufficient documentation

## 2017-06-21 DIAGNOSIS — H5440 Blindness, one eye, unspecified eye: Secondary | ICD-10-CM | POA: Insufficient documentation

## 2017-06-21 MED ORDER — MOMETASONE FURO-FORMOTEROL FUM 200-5 MCG/ACT IN AERO: 2.0000 | INHALATION_SPRAY | Freq: Two times a day (BID) | RESPIRATORY_TRACT | 2 refills | Status: DC

## 2017-06-21 MED ORDER — PREDNISONE 10 MG PO TABS: 10.0000 mg | ORAL_TABLET | Freq: Every day | ORAL | 0 refills | Status: DC

## 2017-06-21 MED ORDER — AMLODIPINE BESYLATE 10 MG PO TABS: 10.0000 mg | ORAL_TABLET | Freq: Every day | ORAL | 0 refills | Status: DC

## 2017-06-21 NOTE — Patient Instructions (Addendum)
It was so nice to meet you!  I have started another inhaler called Dulera. Please use 2 puffs twice a day every day.   We will see you back in 2 weeks to recheck your breathing and to discuss your blood pressure.  -Dr. Brett Albino

## 2017-06-21 NOTE — Progress Notes (Signed)
   [facility] Phone: [phone]  Subjective:  Gary Buchanan is a 51 year old male presenting to clinic for a new patient appointment.  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 No coughing at night. Feeling short of breath all day and night. Endorses shortness of breath with walking and hot temperatures. Using Albuterol nebulizer throughout the day. Taking Budesonide nebulizer bid. Has been hospitalized 4 times this year for asthma.  ROS: No cough, no chest pain, no nausea, no vomiting, no fevers, no chills, no lower extremity edema.  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, HTN, CKD III, gout, OSA, HSV-2  Past Surgical History- Right adrenealectomy 2013, right eyelid surgery 5003, umbilical hernia repair 7048  Medications- Valacyclovir 1g daily, Coreg 6.25mg  bid, Singulair 10mg  daily, Lasix 20mg  daily, Zyrtec 10mg  daily, Albuterol prn, Fluticasone nasal spray 1 spray daily, Budesonide 0.5mg /13ml nebulizer bid, ferrous sulfate 325mg  daily, magnesium 500mg  daily, Januvia 50mg  daily, Clonidine 0.3mg  bid, Naproxen 500mg  daily prn, Aspirin 81mg  daily, Norvasc 10mg  daily, vitamin B6 100mg  daily, Diltazem 180mg  daily, Metoprolol tartrate 25mg  bid  Allergies- ACE inhibitors, Gabapentin, Tizanidine  Family history: - Mother- diabetes, HTN - Sister- diabetes, HTN - Brother- diabetes, HTN  Social history- patient is a never smoker. No alcohol use. No drug use.  Objective: BP (!) 160/80 (BP Location: Left Arm, Patient Position: Sitting, Cuff Size: Normal)   Pulse 64   Temp 97.5 F (36.4 C) (Oral)   Ht 5\' 4"  (1.626 m)   Wt 218 lb 9.6 oz (99.2 kg)   SpO2 93%   BMI 37.52 kg/m  Gen: NAD, alert, cooperative with exam HEENT: NCAT, EOMI, MMM Neck: FROM, supple CV: RRR, no murmur Resp: Normal work of breathing, decreased air movement throughout all lung fields, diffuse expiratory wheezing present, no  crackles Msk: No edema, warm, normal tone, moves UE/LE spontaneously Neuro: Alert and oriented, no gross deficits Skin: No rashes, no lesions Psych: Appropriate behavior  Assessment/Plan: Moderate persistent asthma with exacerbation: Very poorly controlled. Currently using Albuterol neb "all throughout the day". Also using Pulmicort neb bid. Feels short of breath all throughout the day and night. Has been hospitalized 4 times in the last year. 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  Hyman Bible, MD PGY-2

## 2017-06-22 ENCOUNTER — Encounter: Payer: Self-pay | Admitting: Internal Medicine

## 2017-06-22 NOTE — Assessment & Plan Note (Signed)
Very poorly controlled. Currently using Albuterol neb "all throughout the day". Also using Pulmicort neb bid. Feels short of breath all throughout the day and night. Has been hospitalized 4 times in the last year. 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

## 2017-06-23 ENCOUNTER — Encounter: Payer: Self-pay | Admitting: Podiatry

## 2017-06-23 ENCOUNTER — Ambulatory Visit (INDEPENDENT_AMBULATORY_CARE_PROVIDER_SITE_OTHER): Payer: 59 | Admitting: Podiatry

## 2017-06-23 ENCOUNTER — Other Ambulatory Visit: Payer: Self-pay | Admitting: Internal Medicine

## 2017-06-23 VITALS — Ht 64.0 in | Wt 218.0 lb

## 2017-06-23 DIAGNOSIS — M21611 Bunion of right foot: Secondary | ICD-10-CM

## 2017-06-23 DIAGNOSIS — L600 Ingrowing nail: Secondary | ICD-10-CM

## 2017-06-23 DIAGNOSIS — M21612 Bunion of left foot: Secondary | ICD-10-CM

## 2017-06-23 DIAGNOSIS — M79672 Pain in left foot: Secondary | ICD-10-CM

## 2017-06-23 DIAGNOSIS — M21619 Bunion of unspecified foot: Secondary | ICD-10-CM

## 2017-06-23 DIAGNOSIS — B351 Tinea unguium: Secondary | ICD-10-CM

## 2017-06-23 DIAGNOSIS — M79671 Pain in right foot: Secondary | ICD-10-CM

## 2017-06-23 NOTE — Patient Instructions (Signed)
Seen for hypertrophic and painful ingrown nails. No acute infection noted. All nails debrided. Return in 3 months or as needed.

## 2017-06-23 NOTE — Progress Notes (Signed)
SUBJECTIVE: 51 y.o. year old male presents complaining of ingrown nail on left great toe. Having difficulty managing them. 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 This led him to disability. He has had a gout attack a couple of month ago. Stated that his blood sugar is doing fine.   REVIEW OF SYSTEMS: A comprehensive review of systems was negative except for: Poor eye sight and disabled. Periodic gout attack. Cardiac arrythmia prevents him from bending down or doing physical activity.  OBJECTIVE: DERMATOLOGIC EXAMINATION: Nails: Thick dystrophic nails x 10. Ingrown hallucal nails both great toe painful without infection or open skin.  VASCULAR EXAMINATION OF LOWER LIMBS: All pedal pulses are palpable with normal pulsation.  Capillary Filling times within 3 seconds in all digits.  No edema or erythema noted. Temperature gradient from tibial crest to dorsum of foot is within normal bilateral.  NEUROLOGIC EXAMINATION OF THE LOWER LIMBS: Achilles DTR is present and within normal. Monofilament (Semmes-Weinstein 10-gm) sensory testing positive 6 out of 6, bilateral. Vibratory sensations(128Hz  turning fork) intact at medial and lateral forefoot bilateral.  Sharp and Dull discriminatory sensations at the plantar ball of hallux is intact bilateral.   MUSCULOSKELETAL EXAMINATION: Positive for severe HAV with bunion deformity bilateral, painful at times.  ASSESSMENT: HAV with bunion deformity bilateral. Onychocryptosis painful both great toes. Onychomycosis x 10. Pain in both feet.  PLAN: Reviewed clinical findings and available treatment options. All nails debrided. May return in 3 month for palliative care.

## 2017-07-05 ENCOUNTER — Other Ambulatory Visit: Payer: Self-pay | Admitting: Internal Medicine

## 2017-07-07 ENCOUNTER — Other Ambulatory Visit: Payer: Self-pay | Admitting: Internal Medicine

## 2017-07-08 ENCOUNTER — Ambulatory Visit: Payer: Medicaid Other | Admitting: Internal Medicine

## 2017-07-09 ENCOUNTER — Telehealth: Payer: Self-pay | Admitting: *Deleted

## 2017-07-09 MED ORDER — METOPROLOL TARTRATE 25 MG PO TABS: ORAL_TABLET | ORAL | 0 refills | Status: DC

## 2017-07-09 NOTE — Telephone Encounter (Signed)
Pharmacy called and wanted verbal to change med to a 90 day supply.  Pt refuses to pick it up otherwise because it is cheaper for him.  Gave verbal to change and fixed in chart.  Will forward to PCP as FYI. Fleeger, Salome Spotted, CMA

## 2017-07-13 ENCOUNTER — Other Ambulatory Visit: Payer: Self-pay | Admitting: Internal Medicine

## 2017-07-15 DIAGNOSIS — R0602 Shortness of breath: Secondary | ICD-10-CM | POA: Insufficient documentation

## 2017-07-16 DIAGNOSIS — J4541 Moderate persistent asthma with (acute) exacerbation: Secondary | ICD-10-CM | POA: Diagnosis not present

## 2017-07-17 DIAGNOSIS — J4541 Moderate persistent asthma with (acute) exacerbation: Secondary | ICD-10-CM | POA: Diagnosis not present

## 2017-08-25 ENCOUNTER — Other Ambulatory Visit: Payer: Self-pay | Admitting: Internal Medicine

## 2017-08-25 ENCOUNTER — Telehealth: Payer: Self-pay | Admitting: *Deleted

## 2017-08-25 DIAGNOSIS — I4891 Unspecified atrial fibrillation: Secondary | ICD-10-CM

## 2017-08-25 NOTE — Telephone Encounter (Signed)
Referral placed to Cardiology for A-fib. I agree that he does need to be seen in clinic for breathing problems because he does also have asthma. Thanks for trying to get him an appointment.

## 2017-08-25 NOTE — Progress Notes (Signed)
ref

## 2017-08-25 NOTE — Telephone Encounter (Signed)
Patient called requesting a referral to a specialist for A-Fib. He stated he can not walk a long time without stopping due to shortness of breath. Patient advised he needed to be seen in clinic for the breathing problems and follow up for A-Fib. Patient declined appointment and just want the referral. Phone number on file is correct.  Derl Barrow, RN

## 2017-08-31 ENCOUNTER — Other Ambulatory Visit: Payer: Self-pay | Admitting: Internal Medicine

## 2017-09-02 ENCOUNTER — Other Ambulatory Visit: Payer: Self-pay | Admitting: Internal Medicine

## 2017-09-02 DIAGNOSIS — J4541 Moderate persistent asthma with (acute) exacerbation: Secondary | ICD-10-CM | POA: Diagnosis not present

## 2017-09-02 DIAGNOSIS — R0602 Shortness of breath: Secondary | ICD-10-CM | POA: Diagnosis not present

## 2017-09-02 DIAGNOSIS — J4550 Severe persistent asthma, uncomplicated: Secondary | ICD-10-CM

## 2017-09-02 MED ORDER — SITAGLIPTIN PHOSPHATE 50 MG PO TABS: 50.0000 mg | ORAL_TABLET | Freq: Every day | ORAL | 0 refills | Status: DC

## 2017-09-02 MED ORDER — AMLODIPINE BESYLATE 10 MG PO TABS: 10.0000 mg | ORAL_TABLET | Freq: Every day | ORAL | 0 refills | Status: DC

## 2017-09-02 NOTE — Telephone Encounter (Signed)
Pt called and would like to have a referral to be seen by a pulmonary doctor that is male. He doesn't care where it is at he will go. He also needs refills on his amlodipine and Januvia to be called in. Blima Rich

## 2017-09-02 NOTE — Telephone Encounter (Signed)
Will forward to MD. Jazmin Hartsell,CMA  

## 2017-09-02 NOTE — Telephone Encounter (Signed)
Referral placed. Refills sent.

## 2017-09-02 NOTE — Telephone Encounter (Signed)
Pt contacted and VM was left informing of medication refills and referral.

## 2017-09-10 ENCOUNTER — Other Ambulatory Visit: Payer: Self-pay | Admitting: Internal Medicine

## 2017-09-23 ENCOUNTER — Ambulatory Visit: Payer: 59 | Admitting: Podiatry

## 2017-09-23 DIAGNOSIS — H6523 Chronic serous otitis media, bilateral: Secondary | ICD-10-CM | POA: Insufficient documentation

## 2017-09-30 ENCOUNTER — Encounter: Payer: Self-pay | Admitting: *Deleted

## 2017-10-04 ENCOUNTER — Other Ambulatory Visit: Payer: Self-pay | Admitting: Internal Medicine

## 2017-10-07 ENCOUNTER — Ambulatory Visit: Payer: 59 | Admitting: Podiatry

## 2017-10-11 ENCOUNTER — Telehealth: Payer: Self-pay | Admitting: *Deleted

## 2017-10-11 ENCOUNTER — Other Ambulatory Visit: Payer: Self-pay | Admitting: Internal Medicine

## 2017-10-11 ENCOUNTER — Encounter: Payer: Self-pay | Admitting: Cardiology

## 2017-10-11 ENCOUNTER — Ambulatory Visit (INDEPENDENT_AMBULATORY_CARE_PROVIDER_SITE_OTHER): Payer: 59 | Admitting: Cardiology

## 2017-10-11 VITALS — BP 124/60 | HR 64 | Ht 64.0 in | Wt 225.0 lb

## 2017-10-11 DIAGNOSIS — J45901 Unspecified asthma with (acute) exacerbation: Secondary | ICD-10-CM

## 2017-10-11 DIAGNOSIS — R072 Precordial pain: Secondary | ICD-10-CM | POA: Diagnosis not present

## 2017-10-11 DIAGNOSIS — R079 Chest pain, unspecified: Secondary | ICD-10-CM | POA: Insufficient documentation

## 2017-10-11 DIAGNOSIS — I10 Essential (primary) hypertension: Secondary | ICD-10-CM

## 2017-10-11 DIAGNOSIS — I509 Heart failure, unspecified: Secondary | ICD-10-CM | POA: Diagnosis not present

## 2017-10-11 DIAGNOSIS — R0602 Shortness of breath: Secondary | ICD-10-CM | POA: Diagnosis not present

## 2017-10-11 DIAGNOSIS — N183 Chronic kidney disease, stage 3 unspecified: Secondary | ICD-10-CM

## 2017-10-11 MED ORDER — DILTIAZEM HCL ER COATED BEADS 240 MG PO CP24: 240.0000 mg | ORAL_CAPSULE | Freq: Every day | ORAL | 1 refills | Status: DC

## 2017-10-11 MED ORDER — FUROSEMIDE 40 MG PO TABS: 40.0000 mg | ORAL_TABLET | Freq: Every day | ORAL | 2 refills | Status: DC

## 2017-10-11 MED ORDER — PREDNISONE 20 MG PO TABS: 40.0000 mg | ORAL_TABLET | Freq: Every day | ORAL | 0 refills | Status: DC
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
## 2017-10-11 NOTE — Telephone Encounter (Signed)
-----   Message from Dorothy Spark, MD sent at 10/11/2017  5:27 PM EDT ----- Abnormal kidney function and BNP elevated consistent with CHF, please repeat BMP and BNP at the next visit.

## 2017-10-11 NOTE — Telephone Encounter (Signed)
Notified the pt that per Dr Meda Coffee, his labs showed abnormal kidney function and BNP elevated with consistent CHF, and she recommends that we repeat a BMET and PRO-BNP same day as he comes back to see the PA on 11/03/17.  Advised the pt to arrive a few mins before his scheduled appt with the PA, to have his labs done.  Pt verbalized understanding and agrees with this plan.

## 2017-10-11 NOTE — Patient Instructions (Signed)
Medication Instructions:   STOP TAKING METOPROLOL NOW  INCREASE YOUR CARDIZEM CD TO 240 MG ONCE DAILY  INCREASE YOUR LASIX TO 40 MG ONCE DAILY  START TAKING PREDNISONE 40 MG BY MOUTH DAILY FOR 5 DAYS ONLY--TAKE THIS WITH BREAKFAST    Labwork:  TODAY--CMET, CBC W DIFF, TSH, PRO-BNP, AND LIPIDS     Testing/Procedures:  Your physician has requested that you have an echocardiogram. Echocardiography is a painless test that uses sound waves to create images of your heart. It provides your doctor with information about the size and shape of your heart and how well your heart's chambers and valves are working. This procedure takes approximately one hour. There are no restrictions for this procedure.    You have been referred to PULMONOLOGY FOR SEVERE ASTHMA AND SOB     Follow-Up:  [place] PA-C OR BRITTANY SIMMONS PA-C      If you need a refill on your cardiac medications before your next appointment, please call your pharmacy.

## 2017-10-12 ENCOUNTER — Telehealth: Payer: Self-pay | Admitting: *Deleted

## 2017-10-12 LAB — COMPREHENSIVE METABOLIC PANEL
ALT: 19 IU/L (ref 0–44)
AST: 20 IU/L (ref 0–40)
Albumin/Globulin Ratio: 1.8 (ref 1.2–2.2)
Albumin: 4.6 g/dL (ref 3.5–5.5)
Alkaline Phosphatase: 103 IU/L (ref 39–117)
BUN/Creatinine Ratio: 15 (ref 9–20)
BUN: 33 mg/dL — ABNORMAL HIGH (ref 6–24)
Bilirubin Total: 0.3 mg/dL (ref 0.0–1.2)
CO2: 21 mmol/L (ref 20–29)
Calcium: 11.5 mg/dL — ABNORMAL HIGH (ref 8.7–10.2)
Chloride: 104 mmol/L (ref 96–106)
Creatinine, Ser: 2.24 mg/dL — ABNORMAL HIGH (ref 0.76–1.27)
GFR calc Af Amer: 37 mL/min/{1.73_m2} — ABNORMAL LOW (ref >59)
GFR calc non Af Amer: 32 mL/min/{1.73_m2} — ABNORMAL LOW (ref >59)
Globulin, Total: 2.6 g/dL (ref 1.5–4.5)
Glucose: 111 mg/dL — ABNORMAL HIGH (ref 65–99)
Potassium: 4 mmol/L (ref 3.5–5.2)
Sodium: 142 mmol/L (ref 134–144)
Total Protein: 7.2 g/dL (ref 6.0–8.5)

## 2017-10-12 LAB — CBC WITH DIFFERENTIAL/PLATELET
Basophils Absolute: 0.1 10*3/uL (ref 0.0–0.2)
Basos: 0 %
EOS (ABSOLUTE): 0.6 10*3/uL — ABNORMAL HIGH (ref 0.0–0.4)
Eos: 4 %
Hematocrit: 39 % (ref 37.5–51.0)
Hemoglobin: 13.3 g/dL (ref 13.0–17.7)
Immature Grans (Abs): 0.1 10*3/uL (ref 0.0–0.1)
Immature Granulocytes: 1 %
Lymphocytes Absolute: 2.3 10*3/uL (ref 0.7–3.1)
Lymphs: 17 %
MCH: 28.2 pg (ref 26.6–33.0)
MCHC: 34.1 g/dL (ref 31.5–35.7)
MCV: 83 fL (ref 79–97)
Monocytes Absolute: 1.5 10*3/uL — ABNORMAL HIGH (ref 0.1–0.9)
Monocytes: 11 %
Neutrophils Absolute: 9.2 10*3/uL — ABNORMAL HIGH (ref 1.4–7.0)
Neutrophils: 67 %
Platelets: 267 10*3/uL (ref 150–379)
RBC: 4.72 x10E6/uL (ref 4.14–5.80)
RDW: 16.7 % — ABNORMAL HIGH (ref 12.3–15.4)
WBC: 13.6 10*3/uL — ABNORMAL HIGH (ref 3.4–10.8)

## 2017-10-12 LAB — PRO B NATRIURETIC PEPTIDE: NT-Pro BNP: 251 pg/mL — ABNORMAL HIGH (ref 0–121)

## 2017-10-12 LAB — LIPID PANEL
Chol/HDL Ratio: 4.8 ratio (ref 0.0–5.0)
Cholesterol, Total: 184 mg/dL (ref 100–199)
HDL: 38 mg/dL — ABNORMAL LOW (ref >39)
LDL Calculated: 130 mg/dL — ABNORMAL HIGH (ref 0–99)
Triglycerides: 79 mg/dL (ref 0–149)
VLDL Cholesterol Cal: 16 mg/dL (ref 5–40)

## 2017-10-12 LAB — TSH: TSH: 0.69 u[IU]/mL (ref 0.450–4.500)

## 2017-10-12 NOTE — Telephone Encounter (Signed)
-----   Message from Dorothy Spark, MD sent at 10/12/2017  9:00 AM EDT ----- TSH normal

## 2017-10-12 NOTE — Telephone Encounter (Signed)
Patient informed. 

## 2017-10-19 ENCOUNTER — Ambulatory Visit: Payer: 59 | Admitting: Podiatry

## 2017-10-20 ENCOUNTER — Other Ambulatory Visit (HOSPITAL_COMMUNITY): Payer: 59

## 2017-10-25 ENCOUNTER — Other Ambulatory Visit: Payer: Self-pay

## 2017-10-25 ENCOUNTER — Ambulatory Visit (HOSPITAL_COMMUNITY): Payer: 59 | Attending: Cardiology

## 2017-10-25 DIAGNOSIS — G4733 Obstructive sleep apnea (adult) (pediatric): Secondary | ICD-10-CM | POA: Insufficient documentation

## 2017-10-25 DIAGNOSIS — R0602 Shortness of breath: Secondary | ICD-10-CM

## 2017-10-25 DIAGNOSIS — N183 Chronic kidney disease, stage 3 (moderate): Secondary | ICD-10-CM | POA: Diagnosis not present

## 2017-10-25 DIAGNOSIS — I509 Heart failure, unspecified: Secondary | ICD-10-CM | POA: Insufficient documentation

## 2017-10-25 DIAGNOSIS — I10 Essential (primary) hypertension: Secondary | ICD-10-CM | POA: Diagnosis not present

## 2017-10-25 DIAGNOSIS — J45901 Unspecified asthma with (acute) exacerbation: Secondary | ICD-10-CM | POA: Diagnosis not present

## 2017-10-25 DIAGNOSIS — Z87891 Personal history of nicotine dependence: Secondary | ICD-10-CM | POA: Insufficient documentation

## 2017-10-25 DIAGNOSIS — I130 Hypertensive heart and chronic kidney disease with heart failure and stage 1 through stage 4 chronic kidney disease, or unspecified chronic kidney disease: Secondary | ICD-10-CM | POA: Diagnosis not present

## 2017-10-25 DIAGNOSIS — E1122 Type 2 diabetes mellitus with diabetic chronic kidney disease: Secondary | ICD-10-CM | POA: Diagnosis not present

## 2017-10-25 DIAGNOSIS — R072 Precordial pain: Secondary | ICD-10-CM | POA: Diagnosis not present

## 2017-10-28 ENCOUNTER — Ambulatory Visit (INDEPENDENT_AMBULATORY_CARE_PROVIDER_SITE_OTHER): Payer: Medicare Other | Admitting: Podiatry

## 2017-10-28 ENCOUNTER — Encounter: Payer: Self-pay | Admitting: Podiatry

## 2017-10-28 DIAGNOSIS — E118 Type 2 diabetes mellitus with unspecified complications: Secondary | ICD-10-CM | POA: Diagnosis not present

## 2017-10-28 DIAGNOSIS — B351 Tinea unguium: Secondary | ICD-10-CM

## 2017-10-28 DIAGNOSIS — L600 Ingrowing nail: Secondary | ICD-10-CM

## 2017-10-28 NOTE — Progress Notes (Signed)
Subjective: 51 y.o. year old male patient presents complaining of painful nails. Patient requests toe nails trimmed.  Type II diabettic, last glucose reading was 111 on 10/11/17.  HPI: Poor eye sight and disabled. Periodic gout attack. Cardiac arrythmia prevents him from bending down or doing physical activity. Patient is diagnosed with stage III Kidney disease and CHF.  Objective: Dermatologic: Thick yellow deformed nails x 10. Vascular: Pedal pulses are all palpable. Orthopedic: Severe hallux valgus with bunion deformities bilateral. Neurologic: All epicritic and tactile sensations grossly intact.  Assessment: Dystrophic mycotic nails x 10. Painful feet.  Treatment: All mycotic nails debrided.  Return in 3 months or as needed.

## 2017-10-28 NOTE — Patient Instructions (Signed)
Seen for hypertrophic nails. All nails debrided. Return in 3 months or as needed.  

## 2017-11-01 ENCOUNTER — Other Ambulatory Visit: Payer: Self-pay | Admitting: Internal Medicine

## 2017-11-02 ENCOUNTER — Institutional Professional Consult (permissible substitution): Payer: 59 | Admitting: Pulmonary Disease

## 2017-11-03 ENCOUNTER — Ambulatory Visit: Payer: 59 | Admitting: Cardiology

## 2017-11-03 ENCOUNTER — Other Ambulatory Visit: Payer: 59

## 2017-11-04 ENCOUNTER — Other Ambulatory Visit: Payer: Self-pay | Admitting: Internal Medicine

## 2017-11-04 ENCOUNTER — Telehealth: Payer: Self-pay | Admitting: Internal Medicine

## 2017-11-04 MED ORDER — CLONIDINE HCL 0.1 MG PO TABS: 0.1000 mg | ORAL_TABLET | Freq: Two times a day (BID) | ORAL | 0 refills | Status: DC

## 2017-11-04 NOTE — Telephone Encounter (Signed)
LM for patient on identified VM that we are unable to fill his script since he is no longer a patient of ours.  Per his chart Dr. Maia Petties is now his PCP and also he has seen another family medicine office in care everywhere too.  He was only seen in our office once. Jazmin Hartsell,CMA

## 2017-11-04 NOTE — Telephone Encounter (Signed)
Needs refill for clonadine.  He was told by his pharmacy it had been denied.  Please send it to [facility] at [place]

## 2017-11-04 NOTE — Telephone Encounter (Signed)
Patient had requested a refill of his Clonidine. On chart review, his PCP had been changed to a different provider and patient has had multiple recent visits at [facility]. It was thought that patient had changed PCPs, so I declined his medication refill. Spoke with patient over the phone and he stated that he would like to continue to be seen in our clinic. I asked him to call to schedule an appointment since he has not been seen since 05/2017. I have also refilled his Clonidine. Patient voiced understanding.  Hyman Bible, MD PGY-3

## 2017-11-05 ENCOUNTER — Institutional Professional Consult (permissible substitution): Payer: 59 | Admitting: Internal Medicine

## 2017-11-05 ENCOUNTER — Other Ambulatory Visit: Payer: Self-pay | Admitting: Internal Medicine

## 2017-11-06 ENCOUNTER — Other Ambulatory Visit: Payer: Self-pay | Admitting: Internal Medicine

## 2017-11-07 ENCOUNTER — Other Ambulatory Visit: Payer: Self-pay | Admitting: Internal Medicine

## 2017-11-07 ENCOUNTER — Other Ambulatory Visit: Payer: Self-pay | Admitting: Cardiology

## 2017-11-07 DIAGNOSIS — I10 Essential (primary) hypertension: Secondary | ICD-10-CM

## 2017-11-07 DIAGNOSIS — R072 Precordial pain: Secondary | ICD-10-CM

## 2017-11-07 DIAGNOSIS — J45901 Unspecified asthma with (acute) exacerbation: Secondary | ICD-10-CM

## 2017-11-07 DIAGNOSIS — R0602 Shortness of breath: Secondary | ICD-10-CM

## 2017-11-09 ENCOUNTER — Encounter (HOSPITAL_BASED_OUTPATIENT_CLINIC_OR_DEPARTMENT_OTHER): Payer: Self-pay | Admitting: Emergency Medicine

## 2017-11-09 ENCOUNTER — Other Ambulatory Visit: Payer: Self-pay

## 2017-11-09 ENCOUNTER — Inpatient Hospital Stay (HOSPITAL_BASED_OUTPATIENT_CLINIC_OR_DEPARTMENT_OTHER)
Admission: EM | Admit: 2017-11-09 | Discharge: 2017-11-11 | DRG: 202 | Disposition: A | Discharge status: Home / Self Care | Payer: Medicare Other | Attending: Internal Medicine | Admitting: Internal Medicine

## 2017-11-09 ENCOUNTER — Emergency Department (HOSPITAL_BASED_OUTPATIENT_CLINIC_OR_DEPARTMENT_OTHER): Payer: Medicare Other

## 2017-11-09 DIAGNOSIS — I129 Hypertensive chronic kidney disease with stage 1 through stage 4 chronic kidney disease, or unspecified chronic kidney disease: Secondary | ICD-10-CM | POA: Diagnosis present

## 2017-11-09 DIAGNOSIS — I5189 Other ill-defined heart diseases: Secondary | ICD-10-CM | POA: Diagnosis not present

## 2017-11-09 DIAGNOSIS — N183 Chronic kidney disease, stage 3 unspecified: Secondary | ICD-10-CM | POA: Diagnosis present

## 2017-11-09 DIAGNOSIS — H5462 Unqualified visual loss, left eye, normal vision right eye: Secondary | ICD-10-CM | POA: Diagnosis present

## 2017-11-09 DIAGNOSIS — Z888 Allergy status to other drugs, medicaments and biological substances status: Secondary | ICD-10-CM

## 2017-11-09 DIAGNOSIS — E1122 Type 2 diabetes mellitus with diabetic chronic kidney disease: Secondary | ICD-10-CM | POA: Diagnosis present

## 2017-11-09 DIAGNOSIS — Z91040 Latex allergy status: Secondary | ICD-10-CM | POA: Diagnosis not present

## 2017-11-09 DIAGNOSIS — Z7951 Long term (current) use of inhaled steroids: Secondary | ICD-10-CM

## 2017-11-09 DIAGNOSIS — E118 Type 2 diabetes mellitus with unspecified complications: Secondary | ICD-10-CM | POA: Diagnosis not present

## 2017-11-09 DIAGNOSIS — R0602 Shortness of breath: Secondary | ICD-10-CM | POA: Diagnosis not present

## 2017-11-09 DIAGNOSIS — J4541 Moderate persistent asthma with (acute) exacerbation: Secondary | ICD-10-CM | POA: Diagnosis not present

## 2017-11-09 DIAGNOSIS — E2609 Other primary hyperaldosteronism: Secondary | ICD-10-CM | POA: Diagnosis not present

## 2017-11-09 DIAGNOSIS — M1A9XX0 Chronic gout, unspecified, without tophus (tophi): Secondary | ICD-10-CM | POA: Diagnosis not present

## 2017-11-09 DIAGNOSIS — N189 Chronic kidney disease, unspecified: Secondary | ICD-10-CM | POA: Diagnosis present

## 2017-11-09 DIAGNOSIS — J4551 Severe persistent asthma with (acute) exacerbation: Secondary | ICD-10-CM

## 2017-11-09 DIAGNOSIS — Z7984 Long term (current) use of oral hypoglycemic drugs: Secondary | ICD-10-CM | POA: Diagnosis not present

## 2017-11-09 DIAGNOSIS — R0600 Dyspnea, unspecified: Secondary | ICD-10-CM | POA: Diagnosis not present

## 2017-11-09 DIAGNOSIS — Z7952 Long term (current) use of systemic steroids: Secondary | ICD-10-CM | POA: Diagnosis not present

## 2017-11-09 DIAGNOSIS — I4891 Unspecified atrial fibrillation: Secondary | ICD-10-CM | POA: Diagnosis not present

## 2017-11-09 DIAGNOSIS — Z7982 Long term (current) use of aspirin: Secondary | ICD-10-CM

## 2017-11-09 DIAGNOSIS — M1A3720 Chronic gout due to renal impairment, left ankle and foot, without tophus (tophi): Secondary | ICD-10-CM | POA: Diagnosis not present

## 2017-11-09 DIAGNOSIS — R062 Wheezing: Secondary | ICD-10-CM | POA: Diagnosis not present

## 2017-11-09 DIAGNOSIS — Z79899 Other long term (current) drug therapy: Secondary | ICD-10-CM | POA: Diagnosis not present

## 2017-11-09 DIAGNOSIS — I10 Essential (primary) hypertension: Secondary | ICD-10-CM | POA: Diagnosis present

## 2017-11-09 DIAGNOSIS — J45901 Unspecified asthma with (acute) exacerbation: Secondary | ICD-10-CM | POA: Diagnosis present

## 2017-11-09 DIAGNOSIS — D631 Anemia in chronic kidney disease: Secondary | ICD-10-CM | POA: Diagnosis present

## 2017-11-09 DIAGNOSIS — H5440 Blindness, one eye, unspecified eye: Secondary | ICD-10-CM | POA: Diagnosis present

## 2017-11-09 DIAGNOSIS — J9600 Acute respiratory failure, unspecified whether with hypoxia or hypercapnia: Secondary | ICD-10-CM | POA: Diagnosis present

## 2017-11-09 LAB — CBC WITH DIFFERENTIAL/PLATELET
BASOS PCT: 1 %
Basophils Absolute: 0.1 10*3/uL (ref 0.0–0.1)
EOS ABS: 1.1 10*3/uL — AB (ref 0.0–0.7)
EOS PCT: 9 %
HCT: 40.3 % (ref 39.0–52.0)
HEMOGLOBIN: 13.4 g/dL (ref 13.0–17.0)
Lymphocytes Relative: 18 %
Lymphs Abs: 2.4 10*3/uL (ref 0.7–4.0)
MCH: 27.5 pg (ref 26.0–34.0)
MCHC: 33.3 g/dL (ref 30.0–36.0)
MCV: 82.6 fL (ref 78.0–100.0)
Monocytes Absolute: 0.5 10*3/uL (ref 0.1–1.0)
Monocytes Relative: 4 %
NEUTROS PCT: 68 %
Neutro Abs: 9 10*3/uL — ABNORMAL HIGH (ref 1.7–7.7)
PLATELETS: 281 10*3/uL (ref 150–400)
RBC: 4.88 MIL/uL (ref 4.22–5.81)
RDW: 16.7 % — ABNORMAL HIGH (ref 11.5–15.5)
WBC: 13.1 10*3/uL — AB (ref 4.0–10.5)

## 2017-11-09 LAB — BASIC METABOLIC PANEL
Anion gap: 4 — ABNORMAL LOW (ref 5–15)
BUN: 30 mg/dL — AB (ref 6–20)
CHLORIDE: 107 mmol/L (ref 101–111)
CO2: 25 mmol/L (ref 22–32)
CREATININE: 2.06 mg/dL — AB (ref 0.61–1.24)
Calcium: 10.4 mg/dL — ABNORMAL HIGH (ref 8.9–10.3)
GFR, EST AFRICAN AMERICAN: 41 mL/min — AB (ref >60)
GFR, EST NON AFRICAN AMERICAN: 35 mL/min — AB (ref >60)
Glucose, Bld: 106 mg/dL — ABNORMAL HIGH (ref 65–99)
POTASSIUM: 4.2 mmol/L (ref 3.5–5.1)
SODIUM: 136 mmol/L (ref 135–145)

## 2017-11-09 LAB — TROPONIN I

## 2017-11-09 LAB — GLUCOSE, CAPILLARY: Glucose-Capillary: 204 mg/dL — ABNORMAL HIGH (ref 65–99)

## 2017-11-09 MED ORDER — FLUTICASONE PROPIONATE 50 MCG/ACT NA SUSP
1.0000 | Freq: Every day | NASAL | Status: DC
  Administered 2017-11-11: 1 via NASAL
  Filled 2017-11-09: qty 16

## 2017-11-09 MED ORDER — MAGNESIUM GLUCONATE 500 MG PO TABS
500.0000 mg | ORAL_TABLET | Freq: Every day | ORAL | Status: DC
  Administered 2017-11-10 – 2017-11-11 (×2): 500 mg via ORAL
  Filled 2017-11-09 (×2): qty 1

## 2017-11-09 MED ORDER — FUROSEMIDE 40 MG PO TABS
40.0000 mg | ORAL_TABLET | Freq: Every day | ORAL | Status: DC
Start: 2017-11-10 — End: 2017-11-11
  Administered 2017-11-10: 40 mg via ORAL
  Filled 2017-11-09 (×2): qty 1

## 2017-11-09 MED ORDER — ONDANSETRON HCL 4 MG PO TABS: 4.0000 mg | ORAL_TABLET | Freq: Four times a day (QID) | ORAL | Status: DC | PRN

## 2017-11-09 MED ORDER — ALBUTEROL SULFATE (2.5 MG/3ML) 0.083% IN NEBU: 2.5000 mg | INHALATION_SOLUTION | RESPIRATORY_TRACT | Status: DC | PRN

## 2017-11-09 MED ORDER — MONTELUKAST SODIUM 10 MG PO TABS
10.0000 mg | ORAL_TABLET | Freq: Every day | ORAL | Status: DC
  Administered 2017-11-10 (×2): 10 mg via ORAL
  Filled 2017-11-09 (×2): qty 1

## 2017-11-09 MED ORDER — FERROUS SULFATE 325 (65 FE) MG PO TABS
325.0000 mg | ORAL_TABLET | Freq: Every day | ORAL | Status: DC
  Administered 2017-11-10 – 2017-11-11 (×2): 325 mg via ORAL
  Filled 2017-11-09 (×3): qty 1

## 2017-11-09 MED ORDER — ALBUTEROL (5 MG/ML) CONTINUOUS INHALATION SOLN
INHALATION_SOLUTION | RESPIRATORY_TRACT | Status: AC
  Administered 2017-11-09: 15 mg
  Filled 2017-11-09: qty 3

## 2017-11-09 MED ORDER — BUDESONIDE 0.5 MG/2ML IN SUSP
0.5000 mg | Freq: Two times a day (BID) | RESPIRATORY_TRACT | Status: DC
  Administered 2017-11-10 (×2): 0.5 mg via RESPIRATORY_TRACT
  Filled 2017-11-09 (×3): qty 2

## 2017-11-09 MED ORDER — IPRATROPIUM BROMIDE 0.02 % IN SOLN
RESPIRATORY_TRACT | Status: AC
  Administered 2017-11-09: 0.5 mg
  Filled 2017-11-09: qty 2.5

## 2017-11-09 MED ORDER — IPRATROPIUM-ALBUTEROL 0.5-2.5 (3) MG/3ML IN SOLN: 3.0000 mL | Freq: Four times a day (QID) | RESPIRATORY_TRACT | Status: DC

## 2017-11-09 MED ORDER — TRAVOPROST 0.004 % OP SOLN: 1.0000 [drp] | Freq: Every day | OPHTHALMIC | Status: DC

## 2017-11-09 MED ORDER — ASPIRIN EC 81 MG PO TBEC
81.0000 mg | DELAYED_RELEASE_TABLET | Freq: Every day | ORAL | Status: DC
  Administered 2017-11-10 – 2017-11-11 (×2): 81 mg via ORAL
  Filled 2017-11-09 (×2): qty 1

## 2017-11-09 MED ORDER — DILTIAZEM HCL ER COATED BEADS 240 MG PO CP24
240.0000 mg | ORAL_CAPSULE | Freq: Every day | ORAL | Status: DC
  Administered 2017-11-10: 240 mg via ORAL
  Filled 2017-11-09: qty 1

## 2017-11-09 MED ORDER — MAGNESIUM SULFATE 2 GM/50ML IV SOLN
2.0000 g | Freq: Once | INTRAVENOUS | Status: AC
  Administered 2017-11-09: 2 g via INTRAVENOUS
  Filled 2017-11-09: qty 50

## 2017-11-09 MED ORDER — LORATADINE 10 MG PO TABS
10.0000 mg | ORAL_TABLET | Freq: Every day | ORAL | Status: DC
  Administered 2017-11-10 – 2017-11-11 (×2): 10 mg via ORAL
  Filled 2017-11-09 (×2): qty 1

## 2017-11-09 MED ORDER — INSULIN ASPART 100 UNIT/ML ~~LOC~~ SOLN: 0.0000 [IU] | Freq: Three times a day (TID) | SUBCUTANEOUS | Status: DC

## 2017-11-09 MED ORDER — HEPARIN SODIUM (PORCINE) 5000 UNIT/ML IJ SOLN
5000.0000 [IU] | Freq: Three times a day (TID) | INTRAMUSCULAR | Status: DC
  Administered 2017-11-10 – 2017-11-11 (×3): 5000 [IU] via SUBCUTANEOUS
  Filled 2017-11-09 (×4): qty 1

## 2017-11-09 MED ORDER — TIMOLOL HEMIHYDRATE 0.25 % OP SOLN: 1.0000 [drp] | Freq: Two times a day (BID) | OPHTHALMIC | Status: DC

## 2017-11-09 MED ORDER — ALLOPURINOL 300 MG PO TABS
300.0000 mg | ORAL_TABLET | Freq: Every day | ORAL | Status: DC
  Administered 2017-11-10: 300 mg via ORAL
  Filled 2017-11-09: qty 1

## 2017-11-09 MED ORDER — ALBUTEROL (5 MG/ML) CONTINUOUS INHALATION SOLN
15.0000 mg/h | INHALATION_SOLUTION | RESPIRATORY_TRACT | Status: AC
  Administered 2017-11-09: 15 mg/h via RESPIRATORY_TRACT
  Filled 2017-11-09: qty 20

## 2017-11-09 MED ORDER — ACETAMINOPHEN 650 MG RE SUPP: 650.0000 mg | Freq: Four times a day (QID) | RECTAL | Status: DC | PRN

## 2017-11-09 MED ORDER — LATANOPROST 0.005 % OP SOLN
1.0000 [drp] | Freq: Every day | OPHTHALMIC | Status: DC
  Administered 2017-11-10 (×2): 1 [drp] via OPHTHALMIC
  Filled 2017-11-09: qty 2.5

## 2017-11-09 MED ORDER — AMLODIPINE BESYLATE 10 MG PO TABS
10.0000 mg | ORAL_TABLET | Freq: Every day | ORAL | Status: DC
  Administered 2017-11-10: 10 mg via ORAL
  Filled 2017-11-09: qty 1

## 2017-11-09 MED ORDER — ALBUTEROL SULFATE (2.5 MG/3ML) 0.083% IN NEBU
2.5000 mg | INHALATION_SOLUTION | RESPIRATORY_TRACT | Status: DC
  Administered 2017-11-10 – 2017-11-11 (×7): 2.5 mg via RESPIRATORY_TRACT
  Filled 2017-11-09 (×8): qty 3

## 2017-11-09 MED ORDER — ALBUTEROL SULFATE (2.5 MG/3ML) 0.083% IN NEBU
2.5000 mg | INHALATION_SOLUTION | RESPIRATORY_TRACT | Status: DC | PRN
  Administered 2017-11-11: 2.5 mg via RESPIRATORY_TRACT
  Filled 2017-11-09: qty 3

## 2017-11-09 MED ORDER — ACETAMINOPHEN 325 MG PO TABS: 650.0000 mg | ORAL_TABLET | Freq: Four times a day (QID) | ORAL | Status: DC | PRN

## 2017-11-09 MED ORDER — VITAMIN B-6 100 MG PO TABS
100.0000 mg | ORAL_TABLET | Freq: Every day | ORAL | Status: DC
Start: 2017-11-10 — End: 2017-11-11
  Administered 2017-11-10 – 2017-11-11 (×2): 100 mg via ORAL
  Filled 2017-11-09 (×2): qty 1

## 2017-11-09 MED ORDER — TIMOLOL MALEATE 0.25 % OP SOLN
1.0000 [drp] | Freq: Two times a day (BID) | OPHTHALMIC | Status: DC
  Administered 2017-11-10 – 2017-11-11 (×4): 1 [drp] via OPHTHALMIC
  Filled 2017-11-09: qty 5

## 2017-11-09 MED ORDER — CLONIDINE HCL 0.1 MG PO TABS
0.3000 mg | ORAL_TABLET | Freq: Two times a day (BID) | ORAL | Status: DC
  Administered 2017-11-10 (×3): 0.3 mg via ORAL
  Filled 2017-11-09 (×3): qty 3

## 2017-11-09 MED ORDER — LINAGLIPTIN 5 MG PO TABS: 5.0000 mg | ORAL_TABLET | Freq: Every day | ORAL | Status: DC

## 2017-11-09 MED ORDER — ONDANSETRON HCL 4 MG/2ML IJ SOLN: 4.0000 mg | Freq: Four times a day (QID) | INTRAMUSCULAR | Status: DC | PRN

## 2017-11-09 NOTE — ED Notes (Signed)
Pt on monitor 

## 2017-11-09 NOTE — Telephone Encounter (Signed)
Pt's pharmacy requesting a refill on prednisone 20 mg tablet. Would you like to refill this medication? Please advise

## 2017-11-09 NOTE — ED Triage Notes (Signed)
Wheezing x5 days.  Brought in by EMS from [facility].  Was given Albuterol 10mg  and Atrovent .5mg  enroute.  Also given Solumedrol 125mg  IV enroute.  Labs drawn at [facility] and EKG done.

## 2017-11-09 NOTE — ED Provider Notes (Signed)
[facility] EMERGENCY DEPARTMENT Provider Note   CSN: [id number] Arrival date & time: 11/09/17  1807     History   Chief Complaint Chief Complaint  Patient presents with  . Wheezing    HPI Gary Buchanan is a 51 y.o. male. Chief complaint is wheezing and shortness of breath. History of asthma.  HPI: 38 51-year-old male. History of asthma. Symptoms only started a few years ago when he began working doing [place]. He feels the dust exacerbates the symptoms. [facility] with an asthma exacerbation. Denies any cardiac history. Heart is a history of A. fib. He does not recall this. No history of CHF. He does have some intermittent lower extremity edema for which he takes when necessary furosemide. He uses albuterol almost daily. Daily Pulmicort. History of hypertension, NIDDM. No history of CHF.   Started feeling short of breath earlier in the day. He presented to [facility]. Had labs obtained. Had an x-ray obtained. Had gotten no treatments. Was feeling more and more short of breath. He states he became anxious and panicked. Apparently he was escorted out of the emergency room because of "being agitated". He was markedly short of breath. He called 911. Per makes right to find him in a restaurant distress tachypneic with audible wheezing and saturations of the high 80s. He was given nebulized albuterol. He was given IV site Medrol transferred here.  Past Medical History:  Diagnosis Date  . A-fib ([place])   . Asthma   . Blindness of left eye   . Gynecomastia   . Hypertension   . IGT (impaired glucose tolerance)   . Left knee pain   . Shortness of breath   . Sinus congestion     Patient Active Problem List   Diagnosis Date Noted  . Chest pain 10/11/2017  . CHF (congestive heart failure) ([place]) 10/11/2017  . Bilateral chronic serous otitis media 09/23/2017  . SOB (shortness of breath) 07/15/2017  . Asthma  06/21/2017  . Blindness of left eye 06/21/2017  . New onset a-fib ([place]) 06/21/2017  . Type 2 diabetes mellitus with complication, without long-term current use of insulin ([place]) 06/21/2017  . Hypertension 06/21/2017  . Gout 06/21/2017  . Obstructive sleep apnea syndrome 06/21/2017  . 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 Moderate persistent asthma with exacerbation 01/27/2017  . 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 Hypertensive retinopathy 01/28/2016  . Nuclear sclerotic cataract 01/28/2016  . Presbyopia 01/28/2016  . Ptosis of eyelid 01/28/2016  . Mood disorder ([place]) 01/07/2015  . Abnormal weight gain 12/11/2014  . Hypercalcemia 12/11/2014  . Muscle cramping 11/19/2014  . Hyperprolactinemia ([place]) 10/10/2014  . Male hypogonadism 10/10/2014  . Chronic gout due to renal impairment, left ankle and foot, without tophus (tophi) 10/02/2014  . Chronic gout of left foot due to renal impairment 10/02/2014  . Chronic gout of right foot due to renal impairment 10/02/2014  . Mild persistent asthma with acute exacerbation 10/02/2014  . Adrenal adenoma 07/23/2014  . Gynecomastia 01/09/2014  . Arthritis, multiple joint involvement 11/10/2013  . Disorder of joint 11/10/2013  . Anemia due to chronic kidney disease 01/31/2013  . Anemia in chronic kidney disease 01/31/2013  . Anemia in chronic kidney disease (CKD) 01/31/2013  . Chronic kidney disease, stage III (moderate) ([place]) 01/31/2013  . CKD (chronic kidney disease), stage III ([place]) 01/31/2013  . Disorder  of optic nerve 01/27/2013  . Glaucoma suspect 01/27/2013  . Nuclear senile cataract 01/27/2013  . Optic atrophy 01/27/2013  . 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  . 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 Senile nuclear sclerosis 01/27/2013  . Herpes dermatitis 11/30/2012  . Herpes simplex complications 46/50/3546  . Herpes simplex with complication 56/81/2751  .  Herpesvirus infection 11/30/2012  . Leg cramps 11/30/2012  . Blepharitis 09/15/2012  . Impairment level: one eye: total impairment: other eye: not specified 09/15/2012  . Myogenic ptosis of eyelid 09/15/2012  . Ptosis, myogenic 09/15/2012  . Total impairment of one eye 09/15/2012  . Visual loss, one eye, no light perception (NLP) 09/15/2012  . 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[place]) 08/19/2012  . 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 Chronic sinusitis 07/06/2012  . Hypokalemia 07/06/2012  . Hyperaldosteronism ([place]) 04/28/2012  . Morbid obesity ([place]) 04/25/2012    Past Surgical History:  Procedure Laterality Date  . ADRENALECTOMY         Home Medications    Prior to Admission medications   Medication Sig Start Date End Date Taking? Authorizing Provider  albuterol (PROVENTIL HFA;VENTOLIN HFA) 108 (90 BASE) MCG/ACT inhaler Inhale 2 puffs into the lungs every 6 (six) hours as needed.      [provider]  albuterol (PROVENTIL) (2.5 MG/3ML) 0.083% nebulizer solution Take 2.5 mg by nebulization every 6 (six) hours as needed.      [provider]  allopurinol (ZYLOPRIM) 300 MG tablet TAKE 1 TABLET BY MOUTH IN THE EVENING WITH SUPPER FOR GOUT PREVENTION 09/01/17   Mayo, Pete Pelt, MD  amLODipine (NORVASC) 10 MG tablet Take 1 tablet (10 mg total) by mouth daily. 09/02/17   Mayo, Pete Pelt, MD  aspirin EC 81 MG tablet Take 81 mg by mouth daily.    [provider]  Blood Glucose Monitoring Suppl (ACCU-CHEK AVIVA PLUS) w/Device KIT USE DAILY 10/12/17   Mikell, Jeani Sow, MD  Brinzolamide-Brimonidine 1-0.2 % SUSP Place 1 drop into the right eye 3 (three) times daily.    [provider]  budesonide (PULMICORT) 0.5 MG/2ML nebulizer solution Take 0.5 mg by nebulization 2 (two) times daily.    [provider]  cetirizine (ZYRTEC) 10 MG tablet Take 10 mg by mouth daily.    [provider]  cloNIDine (CATAPRES) 0.3 MG tablet  TAKE 1 TABLET BY MOUTH TWICE DAILY FOR BLOOD PRESSURE 11/08/17   Mayo, Pete Pelt, MD  diltiazem (CARDIZEM CD) 240 MG 24 hr capsule Take 1 capsule (240 mg total) by mouth daily. 10/11/17 01/09/18  Dorothy Spark, MD  DULERA 200-5 MCG/ACT AERO INHALE 2 PUFFS BY MOUTH INTO THE LUNGS TWICE DAILY 07/06/17   Mayo, Pete Pelt, MD  ferrous sulfate 325 (65 FE) MG tablet Take 325 mg by mouth daily with breakfast.    [provider]  fluticasone (FLONASE) 50 MCG/ACT nasal spray Place 1 spray into both nostrils daily.    [provider]  furosemide (LASIX) 40 MG tablet Take 1 tablet (40 mg total) by mouth daily. 10/11/17 01/09/18  Dorothy Spark, MD  magnesium gluconate (MAGONATE) 500 MG tablet Take 500 mg by mouth daily.    [provider]  montelukast (SINGULAIR) 10 MG tablet TAKE 1 TABLET(10 MG) BY MOUTH EVERY NIGHT 11/08/17   Mayo, Pete Pelt, MD  Multiple Vitamins-Minerals (SENTRY SENIOR PO) Take 1 tablet by mouth daily.    [provider]  naproxen (NAPROSYN) 500 MG  tablet Take 500 mg by mouth daily as needed.    [provider]  predniSONE (DELTASONE) 20 MG tablet TAKE 2 TABLETS(40 MG) BY MOUTH DAILY WITH BREAKFAST FOR 5 DAYS 11/09/17   Dorothy Spark, MD  pyridOXINE (VITAMIN B-6) 100 MG tablet Take 100 mg by mouth daily.    [provider]  sitaGLIPtin (JANUVIA) 50 MG tablet Take 1 tablet (50 mg total) by mouth daily. 09/02/17   Mayo, Pete Pelt, MD  timolol (BETIMOL) 0.25 % ophthalmic solution Place 1-2 drops into the right eye 2 (two) times daily.    [provider]  travoprost, benzalkonium, (TRAVATAN) 0.004 % ophthalmic solution Place 1 drop into the right eye at bedtime.    [provider]  valACYclovir (VALTREX) 1000 MG tablet Take 1 tablet by mouth as needed. 02/17/17   [provider]    Family History Family History  Problem Relation Age of Onset  . Diabetes Mother   . Hypertension Mother   .  Hyperlipidemia Mother   . Diabetes Father   . Hypertension Father   . Diabetes Sister   . Hypertension Sister   . Diabetes Brother   . Hypertension Brother     Social History Social History   Tobacco Use  . Smoking status: Never Smoker  . Smokeless tobacco: Never Used  Substance Use Topics  . Alcohol use: No  . Drug use: No     Allergies   Latex; Lisinopril; Ace inhibitors; Gabapentin; and Tizanidine   Review of Systems Review of Systems  Constitutional: Negative for appetite change, chills, diaphoresis, fatigue and fever.  HENT: Negative for mouth sores, sore throat and trouble swallowing.   Eyes: Negative for visual disturbance.  Respiratory: Positive for shortness of breath and wheezing. Negative for cough and chest tightness.   Cardiovascular: Negative for chest pain.  Gastrointestinal: Negative for abdominal distention, abdominal pain, diarrhea, nausea and vomiting.  Endocrine: Negative for polydipsia, polyphagia and polyuria.  Genitourinary: Negative for dysuria, frequency and hematuria.  Musculoskeletal: Negative for gait problem.  Skin: Negative for color change, pallor and rash.  Neurological: Negative for dizziness, syncope, light-headedness and headaches.  Hematological: Does not bruise/bleed easily.  Psychiatric/Behavioral: Negative for behavioral problems and confusion.     Physical Exam Updated Vital Signs BP 117/87   Pulse 78   Temp (!) 97.5 F (36.4 C) (Oral)   Resp (!) 25   Ht 5' 2"  (1.575 m)   Wt 96.6 kg (213 lb)   SpO2 95%   BMI 38.96 kg/m   Physical Exam  Constitutional: He is oriented to person, place, and time. No distress.  Tachypneic. Wheezing. No distress. Speaking in near full sentences.  HENT:  Head: Normocephalic.  Eyes: Conjunctivae are normal. Pupils are equal, round, and reactive to light. No scleral icterus.  Neck: Normal range of motion. Neck supple. No thyromegaly present.  Cardiovascular: Normal rate and regular rhythm.  Exam reveals no gallop and no friction rub.  No murmur heard. 1+ symmetric lower extremity edema.  Pulmonary/Chest: No respiratory distress. He has no wheezes. He has no rales.  Wheezing and prolongation in all fields. He states he feels "better" after a nebulized albuterol from paramedic's. No crackles or rales. No asymmetry.  Abdominal: Soft. Bowel sounds are normal. He exhibits no distension. There is no tenderness. There is no rebound.  Musculoskeletal: Normal range of motion.  Neurological: He is alert and oriented to person, place, and time.  Skin: Skin is warm and dry. No rash  noted.  Psychiatric: He has a normal mood and affect. His behavior is normal.     ED Treatments / Results  Labs (all labs ordered are listed, but only abnormal results are displayed) Labs Reviewed  CBC WITH DIFFERENTIAL/PLATELET - Abnormal; Notable for the following components:      Result Value   WBC 13.1 (*)    RDW 16.7 (*)    Neutro Abs 9.0 (*)    Eosinophils Absolute 1.1 (*)    All other components within normal limits  BASIC METABOLIC PANEL - Abnormal; Notable for the following components:   Glucose, Bld 106 (*)    BUN 30 (*)    Creatinine, Ser 2.06 (*)    Calcium 10.4 (*)    GFR calc non Af Amer 35 (*)    GFR calc Af Amer 41 (*)    Anion gap 4 (*)    All other components within normal limits  TROPONIN I    EKG  EKG Interpretation None       Radiology Dg Chest Port 1 View  Result Date: 11/09/2017 CLINICAL DATA:  Shortness of breath an wheezing over the last 5 days. EXAM: PORTABLE CHEST 1 VIEW COMPARISON:  Earlier same day FINDINGS: Heart size is normal. Mild tortuosity of the aorta. The lungs are clear. Lung volumes normal. No infiltrate, collapse or effusion. No significant bone finding. IMPRESSION: No active disease. Electronically Signed   By: Nelson Chimes M.D.   On: 11/09/2017 19:04    Procedures Procedures (including critical care time)  Medications Ordered in  ED Medications  albuterol (PROVENTIL,VENTOLIN) solution continuous neb (15 mg/hr Nebulization New Bag/Given 11/09/17 1934)  magnesium sulfate IVPB 2 g 50 mL (2 g Intravenous New Bag/Given 11/09/17 1937)  albuterol (PROVENTIL, VENTOLIN) (5 MG/ML) 0.5% continuous inhalation solution (15 mg  Given 11/09/17 1825)  ipratropium (ATROVENT) 0.02 % nebulizer solution (0.5 mg  Given 11/09/17 1825)     Initial Impression / Assessment and Plan / ED Course  I have reviewed the triage vital signs and the nursing notes.  Pertinent labs & imaging results that were available during my care of the patient were reviewed by me and considered in my medical decision making (see chart for details).    Continued on continuously last epidural upon his arrival. This repeated 2 hours total of 20 mg. He has improved with each. He continues to have wheezing and prolongation. Chest x-ray shows no CHF or infiltrates. Normal troponin. EKG sinus rhythm no injury no ectopy. On a room air trial in between nebulizer treatments remains in the mid 80s. He is requiring 2lNC.  I placed call to [facility] regarding admission.  Final Clinical Impressions(s) / ED Diagnoses   Final diagnoses:  Moderate persistent asthma with exacerbation    CRITICAL CARE Performed by: Tanna Furry JOSEPH   Total critical care time: 65 minutes including continuous nebulized up-year-old 2, IV magnesium infusion. Frequent reassessments.  Critical care time was exclusive of separately billable procedures and treating other patients.  Critical care was necessary to treat or prevent imminent or life-threatening deterioration.  Critical care was time spent personally by me on the following activities: development of treatment plan with patient and/or surrogate as well as nursing, discussions with consultants, evaluation of patient's response to treatment, examination of patient, obtaining history from patient or surrogate, ordering and  performing treatments and interventions, ordering and review of laboratory studies, ordering and review of radiographic studies, pulse oximetry and re-evaluation of patient's condition.  ED Discharge Orders    None       Tanna Furry, MD 11/09/17 2012

## 2017-11-09 NOTE — H&P (Signed)
History and Physical    Gary Buchanan BTD:[id number] DOB: [birth year] DOA: 11/09/2017  PCP: Sela Hua, MD  Patient coming from: Home.  Chief Complaint: Shortness of breath.  HPI: Gary Buchanan is a 51 y.o. male with history of asthma, hypertension, chronic kidney disease stage III, gout, diabetes mellitus, diastolic dysfunction, blindness in the left eye presents to the ER at [facility] with complaints of worsening shortness of breath.  Patient states over the last 1 week patient has been having increasing wheezing and shortness of breath.  Denies any associated chest pain productive cough fever or chills.  Patient states he has been compliant with his medications.  ED Course: In the ER patient was found to be wheezing.  Chest x-ray was unremarkable.  Patient was given nebulizer treatment and steroids and since patient was having ongoing wheezing and shortness of breath patient is being admitted for further management of asthma exacerbation.  Has had recent 2D echo done last month which showed EF of 55-60% with grade 1 diastolic dysfunction.  Review of Systems: As per HPI, rest all negative.   Past Medical History:  Diagnosis Date  . A-fib ([place])   . Asthma   . Blindness of left eye   . Gynecomastia   . Hypertension   . IGT (impaired glucose tolerance)   . Left knee pain   . Shortness of breath   . Sinus congestion     Past Surgical History:  Procedure Laterality Date  . ADRENALECTOMY       reports that  has never smoked. he has never used smokeless tobacco. He reports that he does not drink alcohol or use drugs.  Allergies  Allergen Reactions  . Latex Itching  . Lisinopril Swelling    Other reaction(s): Other Lip swelling  . Ace Inhibitors Swelling    Lips swells  . Gabapentin Other (See Comments)    Other reaction(s): Other (See Comments) Rapid heartbeat  Other reaction(s): Other Rapid heartbeat  Other reaction(s): Other (See Comments) Rapid  heartbeat  Rapid heartbeat   . Tizanidine Other (See Comments)    Other reaction(s): Other chest pain  Other reaction(s): Other (See Comments) Chest pain Other reaction(s): Other (See Comments) Chest pain chest pain     Family History  Problem Relation Age of Onset  . Diabetes Mother   . Hypertension Mother   . Hyperlipidemia Mother   . Diabetes Father   . Hypertension Father   . Diabetes Sister   . Hypertension Sister   . Diabetes Brother   . Hypertension Brother     Prior to Admission medications   Medication Sig Start Date End Date Taking? Authorizing Provider  albuterol (PROVENTIL HFA;VENTOLIN HFA) 108 (90 BASE) MCG/ACT inhaler Inhale 2 puffs into the lungs every 6 (six) hours as needed.      [provider]  albuterol (PROVENTIL) (2.5 MG/3ML) 0.083% nebulizer solution Take 2.5 mg by nebulization every 6 (six) hours as needed.      [provider]  allopurinol (ZYLOPRIM) 300 MG tablet TAKE 1 TABLET BY MOUTH IN THE EVENING WITH SUPPER FOR GOUT PREVENTION 09/01/17   Mayo, Pete Pelt, MD  amLODipine (NORVASC) 10 MG tablet Take 1 tablet (10 mg total) by mouth daily. 09/02/17   Mayo, Pete Pelt, MD  aspirin EC 81 MG tablet Take 81 mg by mouth daily.    [provider]  Blood Glucose Monitoring Suppl (ACCU-CHEK AVIVA PLUS) w/Device KIT USE DAILY 10/12/17   Mikell, Asiyah  Meredeth Ide, MD  Brinzolamide-Brimonidine 1-0.2 % SUSP Place 1 drop into the right eye 3 (three) times daily.    [provider]  budesonide (PULMICORT) 0.5 MG/2ML nebulizer solution Take 0.5 mg by nebulization 2 (two) times daily.    [provider]  cetirizine (ZYRTEC) 10 MG tablet Take 10 mg by mouth daily.    [provider]  cloNIDine (CATAPRES) 0.3 MG tablet TAKE 1 TABLET BY MOUTH TWICE DAILY FOR BLOOD PRESSURE 11/08/17   Mayo, Pete Pelt, MD  diltiazem (CARDIZEM CD) 240 MG 24 hr capsule Take 1 capsule (240 mg total) by mouth daily. 10/11/17 01/09/18  Dorothy Spark, MD  DULERA 200-5 MCG/ACT AERO INHALE 2 PUFFS BY MOUTH INTO THE LUNGS TWICE DAILY 07/06/17   Mayo, Pete Pelt, MD  ferrous sulfate 325 (65 FE) MG tablet Take 325 mg by mouth daily with breakfast.    [provider]  fluticasone (FLONASE) 50 MCG/ACT nasal spray Place 1 spray into both nostrils daily.    [provider]  furosemide (LASIX) 40 MG tablet Take 1 tablet (40 mg total) by mouth daily. 10/11/17 01/09/18  Dorothy Spark, MD  magnesium gluconate (MAGONATE) 500 MG tablet Take 500 mg by mouth daily.    [provider]  montelukast (SINGULAIR) 10 MG tablet TAKE 1 TABLET(10 MG) BY MOUTH EVERY NIGHT 11/08/17   Mayo, Pete Pelt, MD  Multiple Vitamins-Minerals (SENTRY SENIOR PO) Take 1 tablet by mouth daily.    [provider]  naproxen (NAPROSYN) 500 MG tablet Take 500 mg by mouth daily as needed.    [provider]  predniSONE (DELTASONE) 20 MG tablet TAKE 2 TABLETS(40 MG) BY MOUTH DAILY WITH BREAKFAST FOR 5 DAYS 11/09/17   Dorothy Spark, MD  pyridOXINE (VITAMIN B-6) 100 MG tablet Take 100 mg by mouth daily.    [provider]  sitaGLIPtin (JANUVIA) 50 MG tablet Take 1 tablet (50 mg total) by mouth daily. 09/02/17   Mayo, Pete Pelt, MD  timolol (BETIMOL) 0.25 % ophthalmic solution Place 1-2 drops into the right eye 2 (two) times daily.    [provider]  travoprost, benzalkonium, (TRAVATAN) 0.004 % ophthalmic solution Place 1 drop into the right eye at bedtime.    [provider]  valACYclovir (VALTREX) 1000 MG tablet Take 1 tablet by mouth as needed. 02/17/17   [provider]    Physical Exam: Vitals:   11/09/17 2005 11/09/17 2033 11/09/17 2116 11/09/17 2117  BP:   139/77 139/77  Pulse:  92 95 94  Resp:  20  (!) 22  Temp:      TempSrc:      SpO2: 95% 95% 95% 95%  Weight:      Height:          Constitutional: Moderately built and nourished. Vitals:   11/09/17 2005 11/09/17 2033 11/09/17 2116  11/09/17 2117  BP:   139/77 139/77  Pulse:  92 95 94  Resp:  20  (!) 22  Temp:      TempSrc:      SpO2: 95% 95% 95% 95%  Weight:      Height:       Eyes: Anicteric no pallor.  Ptosis of the left eye.  Patient is blind in left eye. ENMT: No discharge from the ears eyes nose or mouth. Neck: No mass felt.  No JVD appreciated. Respiratory: Bilateral expiratory wheezes no crepitations. Cardiovascular: S1-S2 heard.  No murmurs appreciated. Abdomen: Soft nontender bowel sounds  present. Musculoskeletal: No edema.  No joint effusion. Skin: No rash.  Skin appears warm. Neurologic: Alert awake oriented to time place and person.  Moves all extremities. Psychiatric: Appears normal.  Normal affect.   Labs on Admission: I have personally reviewed following labs and imaging studies  CBC: Recent Labs  Lab 11/09/17 1900  WBC 13.1*  NEUTROABS 9.0*  HGB 13.4  HCT 40.3  MCV 82.6  PLT 409   Basic Metabolic Panel: Recent Labs  Lab 11/09/17 1900  NA 136  K 4.2  CL 107  CO2 25  GLUCOSE 106*  BUN 30*  CREATININE 2.06*  CALCIUM 10.4*   GFR: Estimated Creatinine Clearance: 41.9 mL/min (A) (by C-G formula based on SCr of 2.06 mg/dL (H)). Liver Function Tests: No results for input(s): AST, ALT, ALKPHOS, BILITOT, PROT, ALBUMIN in the last 168 hours. No results for input(s): LIPASE, AMYLASE in the last 168 hours. No results for input(s): AMMONIA in the last 168 hours. Coagulation Profile: No results for input(s): INR, PROTIME in the last 168 hours. Cardiac Enzymes: Recent Labs  Lab 11/09/17 1900  TROPONINI <0.03   BNP (last 3 results) Recent Labs    10/11/17 1121  PROBNP 251*   HbA1C: No results for input(s): HGBA1C in the last 72 hours. CBG: No results for input(s): GLUCAP in the last 168 hours. Lipid Profile: No results for input(s): CHOL, HDL, LDLCALC, TRIG, CHOLHDL, LDLDIRECT in the last 72 hours. Thyroid Function Tests: No results for input(s): TSH, T4TOTAL, FREET4,  T3FREE, THYROIDAB in the last 72 hours. Anemia Panel: No results for input(s): VITAMINB12, FOLATE, FERRITIN, TIBC, IRON, RETICCTPCT in the last 72 hours. Urine analysis: No results found for: COLORURINE, APPEARANCEUR, LABSPEC, PHURINE, GLUCOSEU, HGBUR, BILIRUBINUR, KETONESUR, PROTEINUR, UROBILINOGEN, NITRITE, LEUKOCYTESUR Sepsis Labs: @LABRCNTIP (procalcitonin:4,lacticidven:4) )No results found for this or any previous visit (from the past 240 hour(s)).   Radiological Exams on Admission: Dg Chest Port 1 View  Result Date: 11/09/2017 CLINICAL DATA:  Shortness of breath an wheezing over the last 5 days. EXAM: PORTABLE CHEST 1 VIEW COMPARISON:  Earlier same day FINDINGS: Heart size is normal. Mild tortuosity of the aorta. The lungs are clear. Lung volumes normal. No infiltrate, collapse or effusion. No significant bone finding. IMPRESSION: No active disease. Electronically Signed   By: Nelson Chimes M.D.   On: 11/09/2017 19:04    EKG: Independently reviewed.  Normal sinus rhythm.  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 Acute respiratory failure secondary to asthma exacerbation -patient has been placed on albuterol nebulizer scheduled and as needed.  Pulmicort.  Solu-Medrol IV.  Closely follow respiratory status. 2. Diabetes mellitus type 2 -patient is on Januvia.  I have placed patient on moderate dose sliding scale since patient is on Solu-Medrol.  Closely follow metabolic panel for any development of increased anion gap. 3. Hypertension -patient is on amlodipine clonidine which will be  continued. 4. Diastolic dysfunction.  2D echo done last month on Lasix. 5. Chronic kidney disease stage III -closely follow metabolic panel. 6. Gout on allopurinol. 7. On iron supplements. 8. Hypercalcemia -likely chronic will need further  workup as outpatient.   DVT prophylaxis: Lovenox. Code Status: Full code. Family Communication: Discussed with patient. Disposition Plan: Home. Consults called: None. Admission status: Observation.   Rise Patience MD [facility] Pager [phone].  If 7PM-7AM, please contact night-coverage [email or web address] Password [email or web address]  11/09/2017, 11:10 PM

## 2017-11-09 NOTE — ED Notes (Signed)
ED Provider at bedside. 

## 2017-11-09 NOTE — Telephone Encounter (Signed)
Dr. Meda Coffee, we saw this pt back in Oct and gave him prednisone short term and advised him to follow up with Pulmonology, as we ordered and advised at that visit.  Pulmonology called the pt multiple times and he refused the referral to them and turned down their office for new pt consult. Pt now is calling for more prednisone.  Please advise if you want him to have a refill.  Thanks,   [facility]

## 2017-11-10 ENCOUNTER — Other Ambulatory Visit: Payer: Self-pay

## 2017-11-10 DIAGNOSIS — D631 Anemia in chronic kidney disease: Secondary | ICD-10-CM | POA: Diagnosis not present

## 2017-11-10 DIAGNOSIS — E1122 Type 2 diabetes mellitus with diabetic chronic kidney disease: Secondary | ICD-10-CM | POA: Diagnosis not present

## 2017-11-10 DIAGNOSIS — N183 Chronic kidney disease, stage 3 (moderate): Secondary | ICD-10-CM

## 2017-11-10 DIAGNOSIS — M1A9XX0 Chronic gout, unspecified, without tophus (tophi): Secondary | ICD-10-CM | POA: Diagnosis not present

## 2017-11-10 DIAGNOSIS — Z7984 Long term (current) use of oral hypoglycemic drugs: Secondary | ICD-10-CM | POA: Diagnosis not present

## 2017-11-10 DIAGNOSIS — I129 Hypertensive chronic kidney disease with stage 1 through stage 4 chronic kidney disease, or unspecified chronic kidney disease: Secondary | ICD-10-CM | POA: Diagnosis not present

## 2017-11-10 DIAGNOSIS — J4541 Moderate persistent asthma with (acute) exacerbation: Principal | ICD-10-CM

## 2017-11-10 DIAGNOSIS — Z7951 Long term (current) use of inhaled steroids: Secondary | ICD-10-CM | POA: Diagnosis not present

## 2017-11-10 DIAGNOSIS — I5189 Other ill-defined heart diseases: Secondary | ICD-10-CM | POA: Diagnosis not present

## 2017-11-10 DIAGNOSIS — J9600 Acute respiratory failure, unspecified whether with hypoxia or hypercapnia: Secondary | ICD-10-CM | POA: Diagnosis not present

## 2017-11-10 DIAGNOSIS — H5462 Unqualified visual loss, left eye, normal vision right eye: Secondary | ICD-10-CM | POA: Diagnosis not present

## 2017-11-10 DIAGNOSIS — I10 Essential (primary) hypertension: Secondary | ICD-10-CM

## 2017-11-10 DIAGNOSIS — Z79899 Other long term (current) drug therapy: Secondary | ICD-10-CM | POA: Diagnosis not present

## 2017-11-10 DIAGNOSIS — E118 Type 2 diabetes mellitus with unspecified complications: Secondary | ICD-10-CM

## 2017-11-10 DIAGNOSIS — E2609 Other primary hyperaldosteronism: Secondary | ICD-10-CM | POA: Diagnosis not present

## 2017-11-10 DIAGNOSIS — Z91040 Latex allergy status: Secondary | ICD-10-CM | POA: Diagnosis not present

## 2017-11-10 DIAGNOSIS — I4891 Unspecified atrial fibrillation: Secondary | ICD-10-CM | POA: Diagnosis not present

## 2017-11-10 DIAGNOSIS — Z7952 Long term (current) use of systemic steroids: Secondary | ICD-10-CM | POA: Diagnosis not present

## 2017-11-10 DIAGNOSIS — Z7982 Long term (current) use of aspirin: Secondary | ICD-10-CM | POA: Diagnosis not present

## 2017-11-10 DIAGNOSIS — M1A3720 Chronic gout due to renal impairment, left ankle and foot, without tophus (tophi): Secondary | ICD-10-CM

## 2017-11-10 LAB — BASIC METABOLIC PANEL
ANION GAP: 12 (ref 5–15)
BUN: 36 mg/dL — ABNORMAL HIGH (ref 6–20)
CO2: 20 mmol/L — ABNORMAL LOW (ref 22–32)
Calcium: 10.4 mg/dL — ABNORMAL HIGH (ref 8.9–10.3)
Chloride: 105 mmol/L (ref 101–111)
Creatinine, Ser: 2.29 mg/dL — ABNORMAL HIGH (ref 0.61–1.24)
GFR, EST AFRICAN AMERICAN: 36 mL/min — AB (ref >60)
GFR, EST NON AFRICAN AMERICAN: 31 mL/min — AB (ref >60)
Glucose, Bld: 224 mg/dL — ABNORMAL HIGH (ref 65–99)
POTASSIUM: 4.3 mmol/L (ref 3.5–5.1)
SODIUM: 137 mmol/L (ref 135–145)

## 2017-11-10 LAB — CBC
HEMATOCRIT: 39.4 % (ref 39.0–52.0)
HEMOGLOBIN: 13.2 g/dL (ref 13.0–17.0)
MCH: 27.8 pg (ref 26.0–34.0)
MCHC: 33.5 g/dL (ref 30.0–36.0)
MCV: 82.9 fL (ref 78.0–100.0)
Platelets: 253 10*3/uL (ref 150–400)
RBC: 4.75 MIL/uL (ref 4.22–5.81)
RDW: 16.2 % — ABNORMAL HIGH (ref 11.5–15.5)
WBC: 12.1 10*3/uL — AB (ref 4.0–10.5)

## 2017-11-10 LAB — GLUCOSE, CAPILLARY
GLUCOSE-CAPILLARY: 153 mg/dL — AB (ref 65–99)
GLUCOSE-CAPILLARY: 253 mg/dL — AB (ref 65–99)
GLUCOSE-CAPILLARY: 156 mg/dL — AB (ref 65–99)
GLUCOSE-CAPILLARY: 189 mg/dL — AB (ref 65–99)
Glucose-Capillary: 178 mg/dL — ABNORMAL HIGH (ref 65–99)

## 2017-11-10 LAB — HIV ANTIBODY (ROUTINE TESTING W REFLEX): HIV SCREEN 4TH GENERATION: NONREACTIVE

## 2017-11-10 MED ORDER — INSULIN ASPART 100 UNIT/ML ~~LOC~~ SOLN
0.0000 [IU] | Freq: Three times a day (TID) | SUBCUTANEOUS | Status: DC
  Administered 2017-11-10 (×2): 3 [IU] via SUBCUTANEOUS
  Administered 2017-11-11: 2 [IU] via SUBCUTANEOUS

## 2017-11-10 MED ORDER — METHYLPREDNISOLONE SODIUM SUCC 40 MG IJ SOLR
40.0000 mg | Freq: Two times a day (BID) | INTRAMUSCULAR | Status: DC
  Administered 2017-11-10 – 2017-11-11 (×3): 40 mg via INTRAVENOUS
  Filled 2017-11-10 (×4): qty 1

## 2017-11-10 MED ORDER — PANTOPRAZOLE SODIUM 40 MG PO TBEC
40.0000 mg | DELAYED_RELEASE_TABLET | Freq: Every day | ORAL | Status: DC
  Administered 2017-11-11: 40 mg via ORAL
  Filled 2017-11-10: qty 1

## 2017-11-10 NOTE — Progress Notes (Addendum)
PROGRESS NOTE    Gary Buchanan  BOF:[id number] DOB: 17-Apr-1966 DOA: 11/09/2017 PCP: Sela Hua, MD    Brief Narrative:  Gary Buchanan is a 51 y.o. male with history of asthma, hypertension, chronic kidney disease stage III, gout, diabetes mellitus, diastolic dysfunction, blindness in the left eye presents to the ER at [facility] with complaints of worsening shortness of breath.  Patient states over the last 1 week patient has been having increasing wheezing and shortness of breath.  Denies any associated chest pain productive cough fever or chills.  Patient states he has been compliant with his medications.  ED Course: In the ER patient was found to be wheezing.  Chest x-ray was unremarkable.  Patient was given nebulizer treatment and steroids and since patient was having ongoing wheezing and shortness of breath patient is being admitted for further management of asthma exacerbation.  Has had recent 2D echo done last month which showed EF of 55-60% with grade 1 diastolic dysfunction.     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  Patient however with poor to fair air movement, still dyspneic with diffuse expiratory wheezing.  Continue albuterol nebs, Pulmicort, IV Solu-Medrol, Singulair, Claritin.  Will add Flonase and Protonix to current regimen.  Supportive care.  Follow.  2.  Type 2 diabetes mellitus Check a hemoglobin A1c.  CBGs ranging from 153-253.  Patient was refusing subcutaneous insulin however case discussed with patient and he is  agreeable to receive the subcutaneous insulin.  Continue to hold oral hypoglycemic agents.  Follow.  3.  Chronic kidney disease stage III Stable.  4.  Chronic diastolic dysfunction Currently stable and compensated.  Continue home regimen of Lasix, Cardizem, Norvasc.  5.  Chronic gout Stable.  Continue allopurinol.  6.  Hypertension Continue Norvasc, clonidine, Cardizem, Lasix.  7.  Chronic hypercalcemia Currently asymptomatic.  Outpatient follow-up.   DVT prophylaxis: Heparin Code Status: Full Family Communication: Updated patient.  No family at bedside. Disposition Plan: Home when clinically improved.   Consultants:   None  Procedures:   Chest x-ray 11/09/2017  Antimicrobials:   None   Subjective: Patient refusing subcutaneous insulin this morning asking for IV insulin.  Patient states some improvement with his breathing however not at baseline and still dyspneic with expiratory wheezing.  No chest pain.  Objective: Vitals:   11/10/17 0648 11/10/17 0750 11/10/17 1040 11/10/17 1143  BP:   129/72   Pulse: 65  85   Resp: 20     Temp: 97.9 F (36.6 C)     TempSrc: Oral     SpO2: 96% 95%  98%  Weight:      Height:        Intake/Output Summary (Last 24 hours) at 11/10/2017 1241 Last data filed at 11/10/2017 0649 Gross per 24 hour  Intake 50 ml  Output 650 ml  Net -600 ml   Filed Weights   11/09/17 1809  Weight: 96.6 kg (213 lb)    Examination:  General exam: Appears calm and comfortable  Respiratory system:  Coarse expiratory breath sounds.  Expiratory wheezing.  No crackles.  No rhonchi.  Fair air movement.  Respiratory effort normal. Cardiovascular system: S1 & S2 heard, RRR. No JVD, murmurs, rubs, gallops or clicks. No pedal edema. Gastrointestinal system: Abdomen is nondistended, soft and nontender. No organomegaly or masses felt. Normal bowel sounds heard. Central nervous system: Alert and oriented. No focal neurological deficits. Extremities:  Symmetric 5 x 5 power. Skin: No rashes, lesions or ulcers Psychiatry: Judgement and insight appear normal. Mood & affect appropriate.     Data Reviewed: I have personally reviewed following labs and imaging studies  CBC: Recent Labs  Lab 11/09/17 1900 11/10/17 0224  WBC 13.1* 12.1*  NEUTROABS 9.0*  --   HGB 13.4 13.2  HCT 40.3 39.4  MCV 82.6 82.9  PLT 281 941   Basic Metabolic Panel: Recent Labs  Lab 11/09/17 1900 11/10/17 0224  NA 136 137  K 4.2 4.3  CL 107 105  CO2 25 20*  GLUCOSE 106* 224*  BUN 30* 36*  CREATININE 2.06* 2.29*  CALCIUM 10.4* 10.4*   GFR: Estimated Creatinine Clearance: 37.7 mL/min (A) (by C-G formula based on SCr of 2.29 mg/dL (H)). Liver Function Tests: No results for input(s): AST, ALT, ALKPHOS, BILITOT, PROT, ALBUMIN in the last 168 hours. No results for input(s): LIPASE, AMYLASE in the last 168 hours. No results for input(s): AMMONIA in the last 168 hours. Coagulation Profile: No results for input(s): INR, PROTIME in the last 168 hours. Cardiac Enzymes: Recent Labs  Lab 11/09/17 1900  TROPONINI <0.03   BNP (last 3 results) Recent Labs    10/11/17 1121  PROBNP 251*   HbA1C: No results for input(s): HGBA1C in the last 72 hours. CBG: Recent Labs  Lab 11/09/17 2344 11/10/17 0728 11/10/17 1144  GLUCAP 204* 153* 253*   Lipid Profile: No results for input(s): CHOL, HDL, LDLCALC, TRIG, CHOLHDL, LDLDIRECT in the last 72 hours. Thyroid Function Tests: No results for input(s): TSH, T4TOTAL, FREET4, T3FREE, THYROIDAB in the last 72 hours. Anemia Panel: No results for input(s): VITAMINB12, FOLATE, FERRITIN, TIBC, IRON, RETICCTPCT in the last 72 hours. Sepsis Labs: No results for input(s): PROCALCITON, LATICACIDVEN in the last 168 hours.  No results found for this or any previous visit (from the past 240 hour(s)).       Radiology Studies: Dg Chest Port 1 View  Result Date: 11/09/2017 CLINICAL DATA:  Shortness of breath an  wheezing over the last 5 days. EXAM: PORTABLE CHEST 1 VIEW COMPARISON:  Earlier same day FINDINGS: Heart size is normal. Mild tortuosity of the aorta. The lungs are clear. Lung volumes normal. No infiltrate, collapse or effusion. No significant bone finding. IMPRESSION: No active disease. Electronically Signed   By: Nelson Chimes M.D.   On: 11/09/2017 19:04        Scheduled Meds: . albuterol  2.5 mg Nebulization Q4H  . allopurinol  300 mg Oral QHS  . amLODipine  10 mg Oral Daily  . aspirin EC  81 mg Oral Daily  . budesonide  0.5 mg Nebulization BID  . cloNIDine  0.3 mg Oral BID  . diltiazem  240 mg Oral Daily  . ferrous sulfate  325 mg Oral Q breakfast  . fluticasone  1 spray Each Nare Daily  . furosemide  40 mg Oral Daily  . heparin  5,000 Units Subcutaneous Q8H  . insulin aspart  0-15 Units Subcutaneous TID WC  . latanoprost  1 drop Right Eye QHS  . loratadine  10 mg Oral Daily  . magnesium gluconate  500 mg Oral Daily  . methylPREDNISolone (SOLU-MEDROL) injection  40 mg Intravenous Q12H  . montelukast  10 mg Oral QHS  . pyridOXINE  100 mg Oral Daily  . timolol  1 drop Right Eye BID   Continuous Infusions:   LOS: 0 days    Time spent: 2 minutes    Gary Frese, MD [facility] Pager [phone]  If 7PM-7AM, please contact night-coverage [email or web address] Password [email or web address] 11/10/2017, 12:41 PM

## 2017-11-10 NOTE — Care Management Note (Signed)
Case Management Note  Patient Details  Name: Gary Buchanan MRN: [id number] Date of Birth: [birth year]  Subjective/Objective:                  Shortness of breath  Action/Plan: Date: November 10, 2017 Velva Harman, BSN, [place], [place] Chart and notes review for patient progress and needs. Will follow for case management and discharge needs. Next review date: [id number]  Expected Discharge Date:  11/13/17               Expected Discharge Plan:  Home/Self Care  In-House Referral:     Discharge planning Services  CM Consult  Post Acute Care Choice:    Choice offered to:     DME Arranged:    DME Agency:     HH Arranged:    HH Agency:     Status of Service:  In process, will continue to follow  If discussed at Long Length of Stay Meetings, dates discussed:    Additional Comments:  Leeroy Cha, RN 11/10/2017, 8:40 AM

## 2017-11-10 NOTE — Progress Notes (Signed)
Pt. has CPAP remaining in room for prn use, occasionally uses, made aware to notify if needing help.

## 2017-11-11 DIAGNOSIS — Z91040 Latex allergy status: Secondary | ICD-10-CM | POA: Diagnosis not present

## 2017-11-11 DIAGNOSIS — M1A3720 Chronic gout due to renal impairment, left ankle and foot, without tophus (tophi): Secondary | ICD-10-CM | POA: Diagnosis not present

## 2017-11-11 DIAGNOSIS — D631 Anemia in chronic kidney disease: Secondary | ICD-10-CM | POA: Diagnosis not present

## 2017-11-11 DIAGNOSIS — J4541 Moderate persistent asthma with (acute) exacerbation: Secondary | ICD-10-CM | POA: Diagnosis not present

## 2017-11-11 DIAGNOSIS — J9600 Acute respiratory failure, unspecified whether with hypoxia or hypercapnia: Secondary | ICD-10-CM | POA: Diagnosis not present

## 2017-11-11 DIAGNOSIS — E2609 Other primary hyperaldosteronism: Secondary | ICD-10-CM | POA: Diagnosis not present

## 2017-11-11 DIAGNOSIS — Z7951 Long term (current) use of inhaled steroids: Secondary | ICD-10-CM | POA: Diagnosis not present

## 2017-11-11 DIAGNOSIS — I129 Hypertensive chronic kidney disease with stage 1 through stage 4 chronic kidney disease, or unspecified chronic kidney disease: Secondary | ICD-10-CM | POA: Diagnosis not present

## 2017-11-11 DIAGNOSIS — Z79899 Other long term (current) drug therapy: Secondary | ICD-10-CM | POA: Diagnosis not present

## 2017-11-11 DIAGNOSIS — I5189 Other ill-defined heart diseases: Secondary | ICD-10-CM | POA: Diagnosis not present

## 2017-11-11 DIAGNOSIS — M1A9XX0 Chronic gout, unspecified, without tophus (tophi): Secondary | ICD-10-CM | POA: Diagnosis not present

## 2017-11-11 DIAGNOSIS — I4891 Unspecified atrial fibrillation: Secondary | ICD-10-CM | POA: Diagnosis not present

## 2017-11-11 DIAGNOSIS — E1122 Type 2 diabetes mellitus with diabetic chronic kidney disease: Secondary | ICD-10-CM | POA: Diagnosis not present

## 2017-11-11 DIAGNOSIS — H5462 Unqualified visual loss, left eye, normal vision right eye: Secondary | ICD-10-CM | POA: Diagnosis not present

## 2017-11-11 DIAGNOSIS — E118 Type 2 diabetes mellitus with unspecified complications: Secondary | ICD-10-CM | POA: Diagnosis not present

## 2017-11-11 DIAGNOSIS — Z7952 Long term (current) use of systemic steroids: Secondary | ICD-10-CM | POA: Diagnosis not present

## 2017-11-11 DIAGNOSIS — Z7982 Long term (current) use of aspirin: Secondary | ICD-10-CM | POA: Diagnosis not present

## 2017-11-11 DIAGNOSIS — Z7984 Long term (current) use of oral hypoglycemic drugs: Secondary | ICD-10-CM | POA: Diagnosis not present

## 2017-11-11 DIAGNOSIS — N183 Chronic kidney disease, stage 3 (moderate): Secondary | ICD-10-CM | POA: Diagnosis not present

## 2017-11-11 LAB — BASIC METABOLIC PANEL
ANION GAP: 7 (ref 5–15)
BUN: 56 mg/dL — AB (ref 6–20)
CALCIUM: 10.4 mg/dL — AB (ref 8.9–10.3)
CO2: 20 mmol/L — AB (ref 22–32)
Chloride: 106 mmol/L (ref 101–111)
Creatinine, Ser: 2.28 mg/dL — ABNORMAL HIGH (ref 0.61–1.24)
GFR calc Af Amer: 36 mL/min — ABNORMAL LOW (ref >60)
GFR calc non Af Amer: 31 mL/min — ABNORMAL LOW (ref >60)
GLUCOSE: 175 mg/dL — AB (ref 65–99)
Potassium: 4.7 mmol/L (ref 3.5–5.1)
Sodium: 133 mmol/L — ABNORMAL LOW (ref 135–145)

## 2017-11-11 LAB — GLUCOSE, CAPILLARY: GLUCOSE-CAPILLARY: 144 mg/dL — AB (ref 65–99)

## 2017-11-11 MED ORDER — ALBUTEROL SULFATE (2.5 MG/3ML) 0.083% IN NEBU: 2.5000 mg | INHALATION_SOLUTION | Freq: Four times a day (QID) | RESPIRATORY_TRACT | 0 refills | Status: DC | PRN

## 2017-11-11 MED ORDER — ALBUTEROL SULFATE (2.5 MG/3ML) 0.083% IN NEBU
2.5000 mg | INHALATION_SOLUTION | Freq: Four times a day (QID) | RESPIRATORY_TRACT | Status: DC
  Filled 2017-11-11: qty 3

## 2017-11-11 MED ORDER — PREDNISONE 20 MG PO TABS: 20.0000 mg | ORAL_TABLET | Freq: Every day | ORAL | 0 refills | Status: DC

## 2017-11-11 NOTE — Discharge Summary (Signed)
Physician Discharge Summary  Gary Buchanan UUV:[id number] DOB: 1966/08/21 DOA: 11/09/2017  PCP: Sela Hua, MD  Admit date: 11/09/2017 Discharge date: 11/11/2017  Time spent: 60 minutes  Recommendations for Outpatient Follow-up:  1. Follow-up with [facility], Gary Pelt, MD in 1-2 weeks.  On follow-up patient will need a basic metabolic profile done to follow-up on electrolytes and renal function.   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  Diet recommendation: Heart healthy diet  Filed Weights   11/09/17 1809  Weight: 96.6 kg (213 lb)    History of present illness:  Per Dr Kandace Parkins [name] is a 51 y.o. male with history of asthma, hypertension, chronic kidney disease stage III, gout, diabetes mellitus, diastolic dysfunction, blindness in the left eye presented to the ER at [facility] with complaints of worsening shortness of breath.  Patient stated over the last 1 week patient had been having increasing wheezing and shortness of breath.  Denied any associated chest pain productive cough fever or chills.  Patient stated he had been compliant with his medications.  ED Course: In the ER patient was found to be wheezing.  Chest x-ray was unremarkable.  Patient was given nebulizer treatment and steroids and since patient was having ongoing wheezing and shortness of breath patient is being admitted for further management of asthma exacerbation.  Has had recent 2D echo done last month which showed EF of 55-60% with grade 1 diastolic  dysfunction    Hospital Course:  #1 acute asthma exacerbation She was admitted with an acute asthma exacerbation.  Patient was placed on IV steroid taper, Singulair, Claritin, Flonase, PPI, Pulmicort, scheduled albuterol nebulizer treatments.  Patient improved clinically during the hospitalization.  Wheezing improved.  Patient's sats were greater than 92% on ambulation.  Patient during the hospitalization started to refuse nebulizer treatments.  By day of discharge patient had improved clinically, speaking in full sentences, wheezing had improved.  Patient be discharged home on a steroid taper, scheduled nebulizer treatments 3 times daily for the next 5 days and then as needed, Flonase, Singulair, home regimen of Pulmicort twice daily, Dulera inhaler as needed.  She will be discharged in stable and improved condition and some follow-up with PCP in the outpatient setting.    2.  Type 2 diabetes mellitus She is oral hypoglycemic agents were held during the hospitalization and patient placed on subcutaneous insulin.  Patient was refusing subcutaneous insulin during the hospitalization and request an insulin be placed in his IV.  CBGs were followed.  Patient was maintained on the carb modified diet.  Outpatient follow-up with PCP.   3.  Chronic kidney disease stage III Stable.  4.  Chronic diastolic dysfunction Remained stable and compensated throughout the hospitalization.  Patient was maintained on home regimen of Lasix, Cardizem, Norvasc.  5.  Chronic gout Patient maintained on allopurinol.  6.  Hypertension Patient was maintained on Norvasc, clonidine, Cardizem, Lasix.  7.  Chronic hypercalcemia Patient remained asymptomatic.  Outpatient follow-up.    Procedures:  CXR 11/09/2017  Consultations:  None  Discharge  Exam: Vitals:   11/11/17 0953 11/11/17 0954  BP: 106/60   Pulse: 86   Resp: (!) 26 (!) 28  Temp:    SpO2: 95% 95%    General: NAD Cardiovascular:  RRR Respiratory: Minimal expiratory wheezing  Discharge Instructions   Discharge Instructions    Diet - low sodium heart healthy   Complete by:  As directed    Increase activity slowly   Complete by:  As directed      Current Discharge Medication List    START taking these medications   Details  predniSONE (DELTASONE) 20 MG tablet Take 1-3 tablets (20-60 mg total) daily with breakfast by mouth. Take 3 tablets (60 mg) daily x 3 days, then 2 tablets (26m) daily x 3 days, then 1 tablet (247m daily x 3 days. Qty: 18 tablet, Refills: 0      CONTINUE these medications which have CHANGED   Details  albuterol (PROVENTIL) (2.5 MG/3ML) 0.083% nebulizer solution Take 3 mLs (2.5 mg total) every 6 (six) hours as needed by nebulization. Use 3 times daily x 5 days, then every 6 hours as needed. Qty: 75 mL, Refills: 0      CONTINUE these medications which have NOT CHANGED   Details  albuterol (PROVENTIL HFA;VENTOLIN HFA) 108 (90 BASE) MCG/ACT inhaler Inhale 2 puffs into the lungs every 6 (six) hours as needed.      allopurinol (ZYLOPRIM) 300 MG tablet TAKE 1 TABLET BY MOUTH IN THE EVENING WITH SUPPER FOR GOUT PREVENTION Qty: 90 tablet, Refills: 0    amLODipine (NORVASC) 10 MG tablet Take 1 tablet (10 mg total) by mouth daily. Qty: 90 tablet, Refills: 0    aspirin EC 81 MG tablet Take 81 mg by mouth daily.    Brinzolamide-Brimonidine 1-0.2 % SUSP Place 1 drop into the right eye 3 (three) times daily.    budesonide (PULMICORT) 0.5 MG/2ML nebulizer solution Take 0.5 mg by nebulization 2 (two) times daily.    cetirizine (ZYRTEC) 10 MG tablet Take 10 mg by mouth daily.    cloNIDine (CATAPRES) 0.3 MG tablet TAKE 1 TABLET BY MOUTH TWICE DAILY FOR BLOOD PRESSURE Qty: 180 tablet, Refills: 0    diltiazem (CARDIZEM CD) 240 MG 24 hr capsule Take 1 capsule (240 mg total) by mouth daily. Qty: 90 capsule, Refills: 1   Associated Diagnoses: Precordial pain; SOB (shortness of breath); Essential  hypertension; Severe asthma with acute exacerbation, unspecified whether persistent    DULERA 200-5 MCG/ACT AERO INHALE 2 PUFFS BY MOUTH INTO THE LUNGS TWICE DAILY Qty: 13 g, Refills: 0    ferrous sulfate 325 (65 FE) MG tablet Take 325 mg by mouth daily with breakfast.    fluticasone (FLONASE) 50 MCG/ACT nasal spray Place 1 spray into both nostrils daily.    furosemide (LASIX) 40 MG tablet Take 1 tablet (40 mg total) by mouth daily. Qty: 90 tablet, Refills: 2   Associated Diagnoses: Precordial pain; SOB (shortness of breath); Essential hypertension; Severe asthma with acute exacerbation, unspecified whether persistent    magnesium gluconate (MAGONATE) 500 MG tablet Take 500 mg by mouth daily.    montelukast (SINGULAIR) 10 MG tablet TAKE 1 TABLET(10 MG) BY MOUTH EVERY NIGHT Qty: 90 tablet, Refills: 0    Multiple Vitamins-Minerals (SENTRY SENIOR PO) Take 1 tablet by mouth daily.    pyridOXINE (VITAMIN B-6) 100 MG tablet Take 100 mg by mouth daily.    timolol (BETIMOL) 0.25 % ophthalmic solution Place 1-2 drops into the right eye 2 (two) times  daily.    travoprost, benzalkonium, (TRAVATAN) 0.004 % ophthalmic solution Place 1 drop into the right eye at bedtime.    valACYclovir (VALTREX) 1000 MG tablet Take 1 tablet daily by mouth.  Refills: 0    Blood Glucose Monitoring Suppl (ACCU-CHEK AVIVA PLUS) w/Device KIT USE DAILY Qty: 1 kit, Refills: 0       Allergies  Allergen Reactions  . Latex Itching  . Lisinopril Swelling    Other reaction(s): Other Lip swelling  . Ace Inhibitors Swelling    Lips swells  . Gabapentin Other (See Comments)    Other reaction(s): Other (See Comments) Rapid heartbeat  Other reaction(s): Other Rapid heartbeat  Other reaction(s): Other (See Comments) Rapid heartbeat  Rapid heartbeat   . Tizanidine Other (See Comments)    Other reaction(s): Other chest pain  Other reaction(s): Other (See Comments) Chest pain Other reaction(s): Other (See  Comments) Chest pain chest pain    Follow-up Information    [facility], Gary Pelt, MD. Schedule an appointment as soon as possible for a visit in 1 week(s).   Specialty:  Family Medicine Why:  f/u in 1-2 weeks. Contact information: [place] [address] [phone]            The results of significant diagnostics from this hospitalization (including imaging, microbiology, ancillary and laboratory) are listed below for reference.    Significant Diagnostic Studies: Dg Chest Port 1 View  Result Date: 11/09/2017 CLINICAL DATA:  Shortness of breath an wheezing over the last 5 days. EXAM: PORTABLE CHEST 1 VIEW COMPARISON:  Earlier same day FINDINGS: Heart size is normal. Mild tortuosity of the aorta. The lungs are clear. Lung volumes normal. No infiltrate, collapse or effusion. No significant bone finding. IMPRESSION: No active disease. Electronically Signed   By: Nelson Chimes M.D.   On: 11/09/2017 19:04    Microbiology: No results found for this or any previous visit (from the past 240 hour(s)).   Labs: Basic Metabolic Panel: Recent Labs  Lab 11/09/17 1900 11/10/17 0224 11/11/17 0907  NA 136 137 133*  K 4.2 4.3 4.7  CL 107 105 106  CO2 25 20* 20*  GLUCOSE 106* 224* 175*  BUN 30* 36* 56*  CREATININE 2.06* 2.29* 2.28*  CALCIUM 10.4* 10.4* 10.4*   Liver Function Tests: No results for input(s): AST, ALT, ALKPHOS, BILITOT, PROT, ALBUMIN in the last 168 hours. No results for input(s): LIPASE, AMYLASE in the last 168 hours. No results for input(s): AMMONIA in the last 168 hours. CBC: Recent Labs  Lab 11/09/17 1900 11/10/17 0224  WBC 13.1* 12.1*  NEUTROABS 9.0*  --   HGB 13.4 13.2  HCT 40.3 39.4  MCV 82.6 82.9  PLT 281 253   Cardiac Enzymes: Recent Labs  Lab 11/09/17 1900  TROPONINI <0.03   BNP: BNP (last 3 results) No results for input(s): BNP in the last 8760 hours.  ProBNP (last 3 results) Recent Labs    10/11/17 1121  PROBNP 251*     CBG: Recent Labs  Lab 11/10/17 1144 11/10/17 1401 11/10/17 1656 11/10/17 2145 11/11/17 0745  GLUCAP 253* 178* 156* 189* 144*       Signed:  Jodette Wik MD.  [facility] 11/11/2017, 11:58 AM

## 2017-11-11 NOTE — Progress Notes (Signed)
Patient very upset at the beginning of shift about not getting breakfast meat, wanting to leave AMA.  MD notified.  Charge nurse notified.  Diet changed to allow sausage per pt. Request.  When this nurse went back to patient's room, patient then stated he wanted a new doctor.  MD, director and [facility] notified.  AC stated that it was up to the doctor.  MD came and assessed patient.  D/C ordered.  After this, patient noted to be using inhaler from home.  This nurse went over AVS.  Patient verbalized understanding.  Patient left hospital via w/c and ride with friend. Virginia Rochester, RN

## 2017-11-11 NOTE — Progress Notes (Signed)
Patient refusing breathing txs at this time. Says he was having them too frequently and he was unable to sleep due to respiratory txs. Frequency changed to Q6 with PRN available per patient request prior to next scheduled tx. RN aware. Patient sitting without distress. RT will continue to monitor patient.

## 2017-11-22 ENCOUNTER — Other Ambulatory Visit: Payer: 59

## 2017-11-22 ENCOUNTER — Ambulatory Visit: Payer: 59 | Admitting: Cardiology

## 2017-11-24 ENCOUNTER — Inpatient Hospital Stay: Payer: Medicare Other | Admitting: Internal Medicine

## 2017-12-09 ENCOUNTER — Inpatient Hospital Stay: Payer: Medicare Other | Admitting: Internal Medicine

## 2017-12-17 ENCOUNTER — Other Ambulatory Visit: Payer: Self-pay | Admitting: Internal Medicine

## 2017-12-18 ENCOUNTER — Other Ambulatory Visit: Payer: Self-pay | Admitting: Internal Medicine

## 2017-12-23 ENCOUNTER — Other Ambulatory Visit: Payer: Self-pay | Admitting: Internal Medicine

## 2017-12-23 MED ORDER — IPRATROPIUM-ALBUTEROL 0.5-2.5 (3) MG/3ML IN SOLN: 3.0000 mL | Freq: Three times a day (TID) | RESPIRATORY_TRACT | 2 refills | Status: DC | PRN

## 2017-12-23 MED ORDER — BECLOMETHASONE DIPROP HFA 80 MCG/ACT IN AERB: 2.0000 | INHALATION_SPRAY | Freq: Two times a day (BID) | RESPIRATORY_TRACT | 11 refills | Status: DC

## 2017-12-23 MED ORDER — MONTELUKAST SODIUM 10 MG PO TABS: ORAL_TABLET | ORAL | 0 refills | Status: DC

## 2017-12-31 ENCOUNTER — Ambulatory Visit: Payer: Medicare Other | Admitting: Cardiology

## 2018-01-03 ENCOUNTER — Inpatient Hospital Stay: Payer: Medicare Other | Admitting: Internal Medicine

## 2018-01-14 ENCOUNTER — Inpatient Hospital Stay: Payer: 59 | Admitting: Internal Medicine

## 2018-01-14 ENCOUNTER — Other Ambulatory Visit: Payer: Self-pay | Admitting: Internal Medicine

## 2018-01-17 ENCOUNTER — Other Ambulatory Visit: Payer: Self-pay

## 2018-01-17 ENCOUNTER — Ambulatory Visit: Payer: Medicaid Other | Admitting: Cardiology

## 2018-01-18 MED ORDER — CLONIDINE HCL 0.3 MG PO TABS: ORAL_TABLET | ORAL | 0 refills | Status: DC

## 2018-01-21 ENCOUNTER — Telehealth: Payer: Self-pay | Admitting: Internal Medicine

## 2018-01-21 NOTE — Telephone Encounter (Signed)
Spoke with patient and informed him that the only forms were have in our office are the handicap placards for the [facility].  Patient voiced understanding and will try and call his eye care office, [facility], and see if they can provide him with the appropriate forms.  He did make an appt to see PCP in February.  Berkley Cronkright,CMA

## 2018-01-21 NOTE — Telephone Encounter (Signed)
Pt was told he had to get the [facility] forms from his primary care dr.  These forms are one he needs completed to take a driving test.  This is not a DMV physical.  Please advise

## 2018-01-25 ENCOUNTER — Other Ambulatory Visit: Payer: Self-pay | Admitting: Internal Medicine

## 2018-01-25 MED ORDER — FLUTICASONE PROPIONATE HFA 110 MCG/ACT IN AERO: 2.0000 | INHALATION_SPRAY | Freq: Two times a day (BID) | RESPIRATORY_TRACT | 12 refills | Status: DC

## 2018-01-26 ENCOUNTER — Ambulatory Visit: Payer: Medicaid Other | Admitting: Internal Medicine

## 2018-02-02 ENCOUNTER — Ambulatory Visit: Payer: Medicaid Other | Admitting: Internal Medicine

## 2018-02-03 ENCOUNTER — Emergency Department (HOSPITAL_BASED_OUTPATIENT_CLINIC_OR_DEPARTMENT_OTHER): Payer: 59

## 2018-02-03 ENCOUNTER — Other Ambulatory Visit: Payer: Self-pay

## 2018-02-03 ENCOUNTER — Encounter (HOSPITAL_BASED_OUTPATIENT_CLINIC_OR_DEPARTMENT_OTHER): Payer: Self-pay | Admitting: Emergency Medicine

## 2018-02-03 ENCOUNTER — Emergency Department (HOSPITAL_BASED_OUTPATIENT_CLINIC_OR_DEPARTMENT_OTHER)
Admission: EM | Admit: 2018-02-03 | Discharge: 2018-02-03 | Disposition: A | Discharge status: Home / Self Care | Payer: 59 | Attending: Emergency Medicine | Admitting: Emergency Medicine

## 2018-02-03 DIAGNOSIS — I509 Heart failure, unspecified: Secondary | ICD-10-CM | POA: Insufficient documentation

## 2018-02-03 DIAGNOSIS — Z91040 Latex allergy status: Secondary | ICD-10-CM | POA: Diagnosis not present

## 2018-02-03 DIAGNOSIS — E1122 Type 2 diabetes mellitus with diabetic chronic kidney disease: Secondary | ICD-10-CM | POA: Diagnosis not present

## 2018-02-03 DIAGNOSIS — I130 Hypertensive heart and chronic kidney disease with heart failure and stage 1 through stage 4 chronic kidney disease, or unspecified chronic kidney disease: Secondary | ICD-10-CM | POA: Insufficient documentation

## 2018-02-03 DIAGNOSIS — N183 Chronic kidney disease, stage 3 (moderate): Secondary | ICD-10-CM | POA: Insufficient documentation

## 2018-02-03 DIAGNOSIS — Z7982 Long term (current) use of aspirin: Secondary | ICD-10-CM | POA: Diagnosis not present

## 2018-02-03 DIAGNOSIS — R05 Cough: Secondary | ICD-10-CM | POA: Diagnosis present

## 2018-02-03 DIAGNOSIS — Z79899 Other long term (current) drug therapy: Secondary | ICD-10-CM | POA: Diagnosis not present

## 2018-02-03 DIAGNOSIS — J4551 Severe persistent asthma with (acute) exacerbation: Secondary | ICD-10-CM | POA: Diagnosis not present

## 2018-02-03 LAB — CBC WITH DIFFERENTIAL/PLATELET
BASOS ABS: 0.2 10*3/uL — AB (ref 0.0–0.1)
Basophils Relative: 1 %
EOS ABS: 1.1 10*3/uL — AB (ref 0.0–0.7)
Eosinophils Relative: 7 %
HEMATOCRIT: 37.3 % — AB (ref 39.0–52.0)
Hemoglobin: 12.6 g/dL — ABNORMAL LOW (ref 13.0–17.0)
LYMPHS ABS: 3 10*3/uL (ref 0.7–4.0)
Lymphocytes Relative: 19 %
MCH: 28.8 pg (ref 26.0–34.0)
MCHC: 33.8 g/dL (ref 30.0–36.0)
MCV: 85.2 fL (ref 78.0–100.0)
Monocytes Absolute: 1.6 10*3/uL — ABNORMAL HIGH (ref 0.1–1.0)
Monocytes Relative: 10 %
NEUTROS ABS: 9.7 10*3/uL — AB (ref 1.7–7.7)
Neutrophils Relative %: 63 %
PLATELETS: 289 10*3/uL (ref 150–400)
RBC: 4.38 MIL/uL (ref 4.22–5.81)
RDW: 16.2 % — AB (ref 11.5–15.5)
WBC: 15.6 10*3/uL — AB (ref 4.0–10.5)

## 2018-02-03 LAB — COMPREHENSIVE METABOLIC PANEL
ALBUMIN: 4.1 g/dL (ref 3.5–5.0)
ALT: 25 U/L (ref 17–63)
ANION GAP: 10 (ref 5–15)
AST: 25 U/L (ref 15–41)
Alkaline Phosphatase: 124 U/L (ref 38–126)
BILIRUBIN TOTAL: 0.5 mg/dL (ref 0.3–1.2)
BUN: 40 mg/dL — ABNORMAL HIGH (ref 6–20)
CHLORIDE: 102 mmol/L (ref 101–111)
CO2: 27 mmol/L (ref 22–32)
Calcium: 9.9 mg/dL (ref 8.9–10.3)
Creatinine, Ser: 2.31 mg/dL — ABNORMAL HIGH (ref 0.61–1.24)
GFR calc Af Amer: 35 mL/min — ABNORMAL LOW (ref >60)
GFR calc non Af Amer: 31 mL/min — ABNORMAL LOW (ref >60)
GLUCOSE: 158 mg/dL — AB (ref 65–99)
POTASSIUM: 3.3 mmol/L — AB (ref 3.5–5.1)
SODIUM: 139 mmol/L (ref 135–145)
TOTAL PROTEIN: 7.4 g/dL (ref 6.5–8.1)

## 2018-02-03 LAB — TROPONIN I: Troponin I: <0.03 ng/mL (ref <0.03)

## 2018-02-03 LAB — BRAIN NATRIURETIC PEPTIDE: B NATRIURETIC PEPTIDE 5: 33.5 pg/mL (ref 0.0–100.0)

## 2018-02-03 MED ORDER — ALBUTEROL (5 MG/ML) CONTINUOUS INHALATION SOLN
15.0000 mg/h | INHALATION_SOLUTION | RESPIRATORY_TRACT | Status: DC
  Administered 2018-02-03: 15 mg/h via RESPIRATORY_TRACT

## 2018-02-03 MED ORDER — IPRATROPIUM-ALBUTEROL 0.5-2.5 (3) MG/3ML IN SOLN
3.0000 mL | Freq: Once | RESPIRATORY_TRACT | Status: AC
  Administered 2018-02-03: 3 mL via RESPIRATORY_TRACT
  Filled 2018-02-03: qty 3

## 2018-02-03 MED ORDER — FUROSEMIDE 20 MG PO TABS
20.0000 mg | ORAL_TABLET | Freq: Once | ORAL | Status: AC
  Administered 2018-02-03: 20 mg via ORAL
  Filled 2018-02-03: qty 1

## 2018-02-03 MED ORDER — ALBUTEROL (5 MG/ML) CONTINUOUS INHALATION SOLN
INHALATION_SOLUTION | RESPIRATORY_TRACT | Status: AC
  Administered 2018-02-03: 15 mg/h via RESPIRATORY_TRACT
  Filled 2018-02-03: qty 20

## 2018-02-03 MED ORDER — PREDNISONE 50 MG PO TABS
60.0000 mg | ORAL_TABLET | Freq: Once | ORAL | Status: AC
  Administered 2018-02-03: 60 mg via ORAL
  Filled 2018-02-03: qty 1

## 2018-02-03 MED ORDER — ALBUTEROL (5 MG/ML) CONTINUOUS INHALATION SOLN
15.0000 mg/h | INHALATION_SOLUTION | Freq: Once | RESPIRATORY_TRACT | Status: AC
  Administered 2018-02-03: 15 mg/h via RESPIRATORY_TRACT

## 2018-02-03 MED ORDER — MAGNESIUM SULFATE 50 % IJ SOLN
2.0000 g | Freq: Once | INTRAMUSCULAR | Status: AC
  Administered 2018-02-03: 2 g via INTRAVENOUS
  Filled 2018-02-03: qty 4

## 2018-02-03 MED ORDER — IPRATROPIUM BROMIDE 0.02 % IN SOLN
0.5000 mg | Freq: Once | RESPIRATORY_TRACT | Status: AC
Start: 2018-02-03 — End: 2018-02-03
  Administered 2018-02-03: 0.5 mg via RESPIRATORY_TRACT

## 2018-02-03 MED ORDER — PREDNISONE 50 MG PO TABS: ORAL_TABLET | ORAL | 0 refills | Status: DC

## 2018-02-03 MED ORDER — IPRATROPIUM BROMIDE 0.02 % IN SOLN
RESPIRATORY_TRACT | Status: AC
  Administered 2018-02-03: 0.5 mg via RESPIRATORY_TRACT
  Filled 2018-02-03: qty 2.5

## 2018-02-03 MED FILL — predniSONE 50 MG TABS: 50 | 4 days supply | Qty: 4 | Fill #0

## 2018-02-03 NOTE — ED Notes (Addendum)
ED Provider at bedside.  Pt has just completed walking around ED with pulse ox.

## 2018-02-03 NOTE — ED Notes (Signed)
While ambulating in the ED SpO2 89-91% on RA. Noticed increased WOB and pt states "I feel tired."

## 2018-02-03 NOTE — Discharge Instructions (Signed)
[name],  Gary Buchanan are having an asthma exacerbation. Please take prednisone 50 mg for the next 4 days, starting tomorrow. Restart dulera 2 puffs twice daily. Restart pulmicort 0.5 mg twice daily. These are both daily inhalers to help keep you out of trouble with your breathing. Use albuterol as needed as a rescue inhaler for shortness of breath or wheezing.   If you have worsening shortness of breath or chest pain, please seek emergency care.  You have a follow-up appointment for this at the [facility] at 8:45 am with Dr. Criss Rosales on Tuesday, 02/08/18.

## 2018-02-03 NOTE — ED Notes (Signed)
Patient denies pain and is resting comfortably.  

## 2018-02-03 NOTE — ED Notes (Signed)
Pt on monitor 

## 2018-02-03 NOTE — ED Provider Notes (Signed)
[facility] EMERGENCY DEPARTMENT Provider Note   CSN: [id number] Arrival date & time: 02/03/18  1130     History   Chief Complaint Chief Complaint  Patient presents with  . Cough    HPI Gary Buchanan is a 52 y.o. male with history of asthma, HFpEF (55-60%, G1DD), HTN, CKD3, OSA, afib not on anticoagulation, and chronic sinusitis presents for worsening shortness of breath over the last 4-5 days.   HPI Patient had been feeling well until about 5 days ago. He has had worsening shortness of breath and fatigue. He has been taking his albuterol inhaler 3-4 times a day instead of his usual 2 times a day. He has not been using his maintenance inhaler on a regular basis (presumed to be dulera -- is also supposed to be taking pulmicort nebulizer at home); he was not aware he should be doing this. He has sick contact of sister with cold symptoms. He denies fever or chills or chest pain. No palpitations. Shortness of breath a little worse with lying on his side. He took leftover prednisone 20 mg once yesterday and the day prior in an effort to improve symptoms.   Last was hospitalized for asthma exacerbation in November 2018. Has never been intubated.   Past Medical History:  Diagnosis Date  . A-fib ([place])   . Asthma   . Blindness of left eye   . Gynecomastia   . Hypertension   . IGT (impaired glucose tolerance)   . Left knee pain   . Shortness of breath   . Sinus congestion     Patient Active Problem List   Diagnosis Date Noted  . Asthma exacerbation 11/09/2017  . Chest pain 10/11/2017  . CHF (congestive heart failure) ([place]) 10/11/2017  . Bilateral chronic serous otitis media 09/23/2017  . SOB (shortness of breath) 07/15/2017  . Asthma 06/21/2017  . Blindness of left eye 06/21/2017  . New onset a-fib ([place]) 06/21/2017  . Type 2 diabetes mellitus with complication, without long-term current use of insulin ([place]) 06/21/2017  . Hypertension 06/21/2017  . Gout 06/21/2017  .  Obstructive sleep apnea syndrome 06/21/2017  . 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 Moderate persistent asthma with exacerbation 01/27/2017  . 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 Hypertensive retinopathy 01/28/2016  . Nuclear sclerotic cataract 01/28/2016  . Presbyopia 01/28/2016  . Ptosis of eyelid 01/28/2016  . Mood disorder ([place]) 01/07/2015  . Abnormal weight gain 12/11/2014  . Hypercalcemia 12/11/2014  . Muscle cramping 11/19/2014  . Hyperprolactinemia ([place]) 10/10/2014  . Male hypogonadism 10/10/2014  . Chronic gout due to renal impairment, left ankle and foot, without tophus (tophi) 10/02/2014  . Chronic gout of left foot due to renal impairment 10/02/2014  . Chronic gout of right foot due to renal impairment 10/02/2014  . Mild persistent asthma with acute exacerbation 10/02/2014  . Adrenal adenoma 07/23/2014  . Gynecomastia 01/09/2014  . Arthritis, multiple joint involvement 11/10/2013  . Disorder of joint 11/10/2013  . Anemia due to chronic kidney disease 01/31/2013  . Anemia in chronic kidney disease 01/31/2013  . Anemia in chronic kidney disease (CKD) 01/31/2013  . Chronic kidney disease, stage III (moderate) ([place]) 01/31/2013  . CKD (chronic kidney disease), stage III ([place]) 01/31/2013  . Disorder of optic nerve 01/27/2013  . Glaucoma suspect 01/27/2013  . Nuclear senile cataract 01/27/2013  . Optic atrophy 01/27/2013  . 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  .  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 Senile nuclear sclerosis 01/27/2013  . Herpes dermatitis 11/30/2012  . Herpes simplex complications 11/94/1740  . Herpes simplex with complication 81/44/8185  . Herpesvirus infection 11/30/2012  . Leg cramps 11/30/2012  . Blepharitis 09/15/2012  . Impairment level: one eye: total impairment: other eye: not specified 09/15/2012  . Myogenic ptosis of eyelid 09/15/2012  . Ptosis, myogenic 09/15/2012  .  Total impairment of one eye 09/15/2012  . Visual loss, one eye, no light perception (NLP) 09/15/2012  . 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[place]) 08/19/2012  . 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 Chronic sinusitis 07/06/2012  . Hypokalemia 07/06/2012  . Hyperaldosteronism ([place]) 04/28/2012  . Morbid obesity ([place]) 04/25/2012    Past Surgical History:  Procedure Laterality Date  . ADRENALECTOMY    . SINUS ENDO WITH FUSION  10/17/2012   Procedure: SINUS ENDO WITH FUSION;  Surgeon: Ascencion Dike, MD;  Location: [facility];  Service: ENT;  Laterality: Bilateral;  Bilateral Ethmoidectomy,  Bilateral Maxillary Antrostomy, Bilateral Frontal Recess Exploration,   W/ Fusion Protocol       Home Medications    Prior to Admission medications   Medication Sig Start Date End Date Taking? Authorizing Provider  albuterol (PROVENTIL HFA;VENTOLIN HFA) 108 (90 BASE) MCG/ACT inhaler Inhale 2 puffs into the lungs every 6 (six) hours as needed.      [provider]  albuterol (PROVENTIL) (2.5 MG/3ML) 0.083% nebulizer solution Take 3 mLs (2.5 mg total) every 6 (six) hours as needed by nebulization. Use 3 times daily x 5 days, then every 6 hours as needed. 11/11/17   Eugenie Filler, MD  allopurinol (ZYLOPRIM) 300 MG tablet TAKE 1 TABLET BY MOUTH IN THE EVENING WITH SUPPER FOR GOUT PREVENTION 12/17/17   Mayo, Pete Pelt, MD  amLODipine (NORVASC) 10 MG tablet Take 1 tablet (10 mg total) by mouth daily. 09/02/17   Mayo, Pete Pelt, MD  amLODipine (NORVASC) 10 MG tablet TAKE 1 TABLET BY MOUTH DAILY 12/17/17   Mayo, Pete Pelt, MD  aspirin EC 81 MG tablet Take 81 mg by mouth daily.    [provider]  Blood Glucose Monitoring Suppl (ACCU-CHEK AVIVA PLUS) w/Device KIT USE DAILY AS DIRECTED 12/17/17   Mayo, Pete Pelt, MD  Brinzolamide-Brimonidine 1-0.2 % SUSP Place 1 drop into the right eye 3 (three) times daily.    [provider]  budesonide (PULMICORT) 0.5  MG/2ML nebulizer solution Take 0.5 mg by nebulization 2 (two) times daily.    [provider]  cetirizine (ZYRTEC) 10 MG tablet Take 10 mg by mouth daily.    [provider]  cloNIDine (CATAPRES) 0.3 MG tablet TAKE 1 TABLET BY MOUTH TWICE DAILY FOR BLOOD PRESSURE 01/18/18   Mayo, Pete Pelt, MD  cloNIDine (CATAPRES) 0.3 MG tablet TAKE 1 TABLET BY MOUTH TWICE DAILY FOR BLOOD PRESSURE 01/18/18   Mayo, Pete Pelt, MD  diltiazem (CARDIZEM CD) 240 MG 24 hr capsule Take 1 capsule (240 mg total) by mouth daily. 10/11/17 01/09/18  Dorothy Spark, MD  DULERA 200-5 MCG/ACT AERO INHALE 2 PUFFS BY MOUTH INTO THE LUNGS TWICE DAILY 07/06/17   Mayo, Pete Pelt, MD  ferrous sulfate 325 (65 FE) MG tablet Take 325 mg by mouth daily with breakfast.    [provider]  fluticasone (FLONASE) 50 MCG/ACT nasal spray Place 1 spray into both nostrils daily.    [provider]  fluticasone (FLOVENT HFA) 110 MCG/ACT inhaler  Inhale 2 puffs into the lungs 2 (two) times daily. 01/25/18   Mayo, Pete Pelt, MD  furosemide (LASIX) 40 MG tablet Take 1 tablet (40 mg total) by mouth daily. 10/11/17 01/09/18  Dorothy Spark, MD  ipratropium-albuterol (DUONEB) 0.5-2.5 (3) MG/3ML SOLN Take 3 mLs by nebulization every 8 (eight) hours as needed. 12/23/17   Mayo, Pete Pelt, MD  magnesium gluconate (MAGONATE) 500 MG tablet Take 500 mg by mouth daily.    [provider]  montelukast (SINGULAIR) 10 MG tablet TAKE 1 TABLET(10 MG) BY MOUTH DAILY 12/23/17   Mayo, Pete Pelt, MD  Multiple Vitamins-Minerals (SENTRY SENIOR PO) Take 1 tablet by mouth daily.    [provider]  predniSONE (DELTASONE) 50 MG tablet Take 1 tab daily with breakfast for the next 4 days. 02/03/18   Rogue Bussing, MD  pyridOXINE (VITAMIN B-6) 100 MG tablet Take 100 mg by mouth daily.    [provider]  timolol (BETIMOL) 0.25 % ophthalmic solution Place 1-2 drops into the right eye 2 (two) times daily.     [provider]  travoprost, benzalkonium, (TRAVATAN) 0.004 % ophthalmic solution Place 1 drop into the right eye at bedtime.    [provider]  valACYclovir (VALTREX) 1000 MG tablet TAKE 1 TABLET BY MOUTH DAILY 12/22/17   Mayo, Pete Pelt, MD    Family History Family History  Problem Relation Age of Onset  . Diabetes Mother   . Hypertension Mother   . Hyperlipidemia Mother   . Diabetes Father   . Hypertension Father   . Diabetes Sister   . Hypertension Sister   . Diabetes Brother   . Hypertension Brother     Social History Social History   Tobacco Use  . Smoking status: Never Smoker  . Smokeless tobacco: Never Used  Substance Use Topics  . Alcohol use: No  . Drug use: No     Allergies   Latex; Lisinopril; Ace inhibitors; Gabapentin; and Tizanidine   Review of Systems Review of Systems  Constitutional: Positive for fatigue. Negative for appetite change and fever.  HENT: Positive for congestion. Negative for sore throat.   Eyes: Negative for pain.  Respiratory: Positive for shortness of breath and wheezing. Negative for cough.   Cardiovascular: Negative for chest pain and palpitations.  Gastrointestinal: Negative for constipation, diarrhea, nausea and vomiting.  Genitourinary: Negative for dysuria.  Musculoskeletal: Negative for back pain and myalgias.  Skin: Negative for rash.  Neurological: Negative for weakness and light-headedness.     Physical Exam Updated Vital Signs BP 119/70   Pulse 62   Temp 97.9 F (36.6 C) (Oral)   Resp 18   Ht '5\' 2"'$  (1.575 m)   Wt 99.8 kg (220 lb)   SpO2 92%   BMI 40.24 kg/m   Physical Exam  Constitutional: He is oriented to person, place, and time. He appears well-developed and well-nourished. No distress.  HENT:  Head: Normocephalic and atraumatic.  Mouth/Throat: Oropharynx is clear and moist.  Nasal congestion present.  Eyes: EOM are normal.  Neck: Normal range of motion.  Cardiovascular: Normal  rate, regular rhythm and normal heart sounds.  No murmur heard. Pulmonary/Chest: Effort normal. He exhibits no tenderness.  Inspiratory and expiratory wheezes throughout with prolonged expiratory phase.   Abdominal: Soft. Bowel sounds are normal. There is no tenderness.  Musculoskeletal: Normal range of motion. He exhibits no edema.  Neurological: He is alert and oriented to person, place, and time.  Skin: Skin is warm  and dry. No rash noted.  Psychiatric: He has a normal mood and affect.  Nursing note and vitals reviewed.    ED Treatments / Results  Labs (all labs ordered are listed, but only abnormal results are displayed) Labs Reviewed  CBC WITH DIFFERENTIAL/PLATELET - Abnormal; Notable for the following components:      Result Value   WBC 15.6 (*)    Hemoglobin 12.6 (*)    HCT 37.3 (*)    RDW 16.2 (*)    Neutro Abs 9.7 (*)    Monocytes Absolute 1.6 (*)    Eosinophils Absolute 1.1 (*)    Basophils Absolute 0.2 (*)    All other components within normal limits  COMPREHENSIVE METABOLIC PANEL - Abnormal; Notable for the following components:   Potassium 3.3 (*)    Glucose, Bld 158 (*)    BUN 40 (*)    Creatinine, Ser 2.31 (*)    GFR calc non Af Amer 31 (*)    GFR calc Af Amer 35 (*)    All other components within normal limits  BRAIN NATRIURETIC PEPTIDE  TROPONIN I    EKG  EKG Interpretation  Date/Time:  Thursday February 03 2018 11:40:05 EST Ventricular Rate:  66 PR Interval:    QRS Duration: 99 QT Interval:  391 QTC Calculation: 410 R Axis:   83 Text Interpretation:  Sinus rhythm Prolonged PR interval No STEMI.  Confirmed by Nanda Quinton [phone]) on 02/03/2018 11:56:15 AM       Radiology Dg Chest 2 View  Result Date: 02/03/2018 CLINICAL DATA:  Shortness of breath for 5 days, just had breathing treatment EXAM: CHEST  2 VIEW COMPARISON:  11/09/2017 FINDINGS: Enlargement of cardiac silhouette with pulmonary vascular congestion. Mediastinal contours normal. Lungs  clear. No pleural effusion or pneumothorax. Bones unremarkable. IMPRESSION: Enlargement of cardiac silhouette with pulmonary vascular congestion. No acute abnormalities. Electronically Signed   By: Lavonia Dana M.D.   On: 02/03/2018 12:53    Procedures Procedures (including critical care time)  Medications Ordered in ED Medications  albuterol (PROVENTIL,VENTOLIN) solution continuous neb (0 mg/hr Nebulization Stopped 02/03/18 1400)  ipratropium (ATROVENT) nebulizer solution 0.5 mg (0.5 mg Nebulization Given 02/03/18 1141)  predniSONE (DELTASONE) tablet 60 mg (60 mg Oral Given 02/03/18 1308)  furosemide (LASIX) tablet 20 mg (20 mg Oral Given 02/03/18 1332)  ipratropium-albuterol (DUONEB) 0.5-2.5 (3) MG/3ML nebulizer solution 3 mL (3 mLs Nebulization Given 02/03/18 1333)  albuterol (PROVENTIL,VENTOLIN) solution continuous neb (15 mg/hr Nebulization Given 02/03/18 1447)  magnesium sulfate (IV Push/IM) injection 2 g (2 g Intravenous Given 02/03/18 1452)     Initial Impression / Assessment and Plan / ED Course  I have reviewed the triage vital signs and the nursing notes.  Pertinent labs & imaging results that were available during my care of the patient were reviewed by me and considered in my medical decision making (see chart for details).  Patient initially desaturating to 88% while ambulating.  Received CAT x 1 hour with reported improvement in symptoms but still desaturating with ambulation.  CXR without evidence of pneumonia and unchanged enlarged cardiac silhouette and pulmonary vascular congestion from prior. BNP WNL.   Troponin negative. EKG normal.   60 mg prednisone given.   Repeated CAT and gave 2 mg of IV magnesium. Desaturated down to only 91% and maintaining sats about 95% at rest; patient reports wanting to go home. Wheezing improved on exam.   Final Clinical Impressions(s) / ED Diagnoses   Final diagnoses:  Severe persistent asthma  with exacerbation   Patient discharged with  prescription for 4 more days of prednisone for acute asthma exacerbation, possibly triggered by URI given sick contact. Counseled to take both pulmicort and dulera BID with albuterol as needed and to continue singulair. Counseled to return to ED if has worsening shortness of breath or chest pain. Patient expressed understanding. Made follow-up appointment for early next week and asked patient to bring his medications with him so that they can be reviewed.   PCP previously suggested pulmonology referral; kidney function stable but would benefit from nephrology referral if does not follow with someone already.   ED Discharge Orders        Ordered    predniSONE (DELTASONE) 50 MG tablet     02/03/18 1552     Olene Floss, MD [place], PGY-3    Rogue Bussing, MD 02/03/18 1620    Margette Fast, MD 02/03/18 [phone]

## 2018-02-03 NOTE — ED Notes (Signed)
Walked pt per MD starting SPO2 was 95% ending SPO2 91%

## 2018-02-03 NOTE — ED Notes (Signed)
ED Provider at bedside. 

## 2018-02-03 NOTE — ED Triage Notes (Signed)
Patient states that he had a hx of asthma - patient comes in because of 4 -5 days of SOB - RA sats of 88% after walking

## 2018-02-03 NOTE — ED Notes (Signed)
RRT Crystal ambulated Pt and documented it.

## 2018-02-07 ENCOUNTER — Other Ambulatory Visit: Payer: Self-pay | Admitting: Internal Medicine

## 2018-02-07 ENCOUNTER — Ambulatory Visit (INDEPENDENT_AMBULATORY_CARE_PROVIDER_SITE_OTHER): Payer: 59 | Admitting: Cardiology

## 2018-02-07 ENCOUNTER — Encounter: Payer: Self-pay | Admitting: Cardiology

## 2018-02-07 VITALS — BP 116/80 | HR 105 | Resp 16 | Ht 64.0 in | Wt 219.8 lb

## 2018-02-07 DIAGNOSIS — Z87898 Personal history of other specified conditions: Secondary | ICD-10-CM

## 2018-02-07 DIAGNOSIS — Z8709 Personal history of other diseases of the respiratory system: Secondary | ICD-10-CM

## 2018-02-07 NOTE — Progress Notes (Signed)
02/07/2018 Gary Buchanan   [birth year]  [id number]  Primary Physician Mayo, Pete Pelt, MD Primary Cardiologist: Dr. Meda Coffee   Reason for Visit/CC: f/u for Dyspnea   HPI:  Gary Buchanan is a 52 y.o. male , followed by Dr. Meda Coffee, who is being seen today for f/u given recent complaints of dyspnea. He was seen by Dr. Meda Coffee back in October 2018 for exertional dyspnea and wheezing. Meds were adjusted and he was instructed to f/u in 3 weeks but never did.   He is back now. In summary, when seen by Dr. Meda Coffee in October it sounded as though his symptoms were more asthma related, which he has a h/o. There was ? Regarding compliance with his inhalers. Pt noted exertional dyspnea at that time and had significant wheezing. Dr. Meda Coffee order prednisone and a 2D echo. He was felt to be a poor candidate for NST at that time given active wheezing (contraindication for Lexiscan ), also unable to do exercise nuclear study due to visual impairment and not a candidate for coronary CTA given CKD. Dr. Meda Coffee wanted to reassess after treatment w/ prednisone and to reconsider Lexiscan NST if wheezing had resolved.   Echo done 10/25/18 showed normal LVEF at 55-60% with G1DD. No WMA and no valvular disease.   Of note, he was also recently seen in the ED at [facility] on 02/03/18 for severe persistent asthma. BNP was obtained and normal at 14.0. He was given albuterol, mg and steroids in ED, which resulted in symptomatic improvement. Admission was offered for further observation, however pt refused and went home.  Today in clinic, he notes his breathing is stable. Comfortable currently but still has bouts of dyspnea, and needing to use rescue inhaler. He has both inspiratory and expiratory wheezing on exam today. He has an appt to see his PCP tomorrow. No chest pain.   Current Meds  Medication Sig  . albuterol (PROVENTIL HFA;VENTOLIN HFA) 108 (90 BASE) MCG/ACT inhaler Inhale 2 puffs into the lungs every 6 (six) hours as needed.      [place] Kitchen albuterol (PROVENTIL) (2.5 MG/3ML) 0.083% nebulizer solution Take 3 mLs (2.5 mg total) every 6 (six) hours as needed by nebulization. Use 3 times daily x 5 days, then every 6 hours as needed.  [place] Kitchen allopurinol (ZYLOPRIM) 300 MG tablet TAKE 1 TABLET BY MOUTH IN THE EVENING WITH SUPPER FOR GOUT PREVENTION  . amLODipine (NORVASC) 10 MG tablet Take 1 tablet (10 mg total) by mouth daily.  [place] Kitchen amLODipine (NORVASC) 10 MG tablet TAKE 1 TABLET BY MOUTH DAILY  . aspirin EC 81 MG tablet Take 81 mg by mouth daily.  . Blood Glucose Monitoring Suppl (ACCU-CHEK AVIVA PLUS) w/Device KIT USE DAILY AS DIRECTED  . Brinzolamide-Brimonidine 1-0.2 % SUSP Place 1 drop into the right eye 3 (three) times daily.  . budesonide (PULMICORT) 0.5 MG/2ML nebulizer solution Take 0.5 mg by nebulization 2 (two) times daily.  . cetirizine (ZYRTEC) 10 MG tablet Take 10 mg by mouth daily.  . cloNIDine (CATAPRES) 0.3 MG tablet TAKE 1 TABLET BY MOUTH TWICE DAILY FOR BLOOD PRESSURE  . cloNIDine (CATAPRES) 0.3 MG tablet TAKE 1 TABLET BY MOUTH TWICE DAILY FOR BLOOD PRESSURE  . DULERA 200-5 MCG/ACT AERO INHALE 2 PUFFS BY MOUTH INTO THE LUNGS TWICE DAILY  . ferrous sulfate 325 (65 FE) MG tablet Take 325 mg by mouth daily with breakfast.  . fluticasone (FLONASE) 50 MCG/ACT nasal spray Place 1 spray into both nostrils daily.  . fluticasone (FLOVENT HFA) 110  MCG/ACT inhaler Inhale 2 puffs into the lungs 2 (two) times daily.  [place] Kitchen ipratropium-albuterol (DUONEB) 0.5-2.5 (3) MG/3ML SOLN Take 3 mLs by nebulization every 8 (eight) hours as needed.  . magnesium gluconate (MAGONATE) 500 MG tablet Take 500 mg by mouth daily.  . montelukast (SINGULAIR) 10 MG tablet TAKE 1 TABLET(10 MG) BY MOUTH DAILY  . Multiple Vitamins-Minerals (SENTRY SENIOR PO) Take 1 tablet by mouth daily.  . predniSONE (DELTASONE) 50 MG tablet Take 1 tab daily with breakfast for the next 4 days.  . pyridOXINE (VITAMIN B-6) 100 MG tablet Take 100 mg by mouth daily.  . timolol  (BETIMOL) 0.25 % ophthalmic solution Place 1-2 drops into the right eye 2 (two) times daily.  . travoprost, benzalkonium, (TRAVATAN) 0.004 % ophthalmic solution Place 1 drop into the right eye at bedtime.  . valACYclovir (VALTREX) 1000 MG tablet TAKE 1 TABLET BY MOUTH DAILY   Allergies  Allergen Reactions  . Latex Itching  . Lisinopril Swelling    Other reaction(s): Other Lip swelling  . Ace Inhibitors Swelling    Lips swells  . Gabapentin Other (See Comments)    Other reaction(s): Other (See Comments) Rapid heartbeat  Other reaction(s): Other Rapid heartbeat  Other reaction(s): Other (See Comments) Rapid heartbeat  Rapid heartbeat   . Tizanidine Other (See Comments)    Other reaction(s): Other chest pain  Other reaction(s): Other (See Comments) Chest pain Other reaction(s): Other (See Comments) Chest pain chest pain    Past Medical History:  Diagnosis Date  . A-fib ([place])   . Asthma   . Blindness of left eye   . Gynecomastia   . Hypertension   . IGT (impaired glucose tolerance)   . Left knee pain   . Shortness of breath   . Sinus congestion    Family History  Problem Relation Age of Onset  . Diabetes Mother   . Hypertension Mother   . Hyperlipidemia Mother   . Diabetes Father   . Hypertension Father   . Diabetes Sister   . Hypertension Sister   . Diabetes Brother   . Hypertension Brother    Past Surgical History:  Procedure Laterality Date  . ADRENALECTOMY    . SINUS ENDO WITH FUSION  10/17/2012   Procedure: SINUS ENDO WITH FUSION;  Surgeon: Ascencion Dike, MD;  Location: [facility];  Service: ENT;  Laterality: Bilateral;  Bilateral Ethmoidectomy,  Bilateral Maxillary Antrostomy, Bilateral Frontal Recess Exploration,   W/ Fusion Protocol   Social History   Socioeconomic History  . Marital status: Single    Spouse name: Not on file  . Number of children: Not on file  . Years of education: Not on file  . Highest education level: Not on file    Social Needs  . Financial resource strain: Not on file  . Food insecurity - worry: Not on file  . Food insecurity - inability: Not on file  . Transportation needs - medical: Not on file  . Transportation needs - non-medical: Not on file  Occupational History  . Not on file  Tobacco Use  . Smoking status: Never Smoker  . Smokeless tobacco: Never Used  Substance and Sexual Activity  . Alcohol use: No  . Drug use: No  . Sexual activity: Not on file  Other Topics Concern  . Not on file  Social History Narrative  . Not on file     Review of Systems: General: negative for chills, fever, night sweats or weight  changes.  Cardiovascular: negative for chest pain, dyspnea on exertion, edema, orthopnea, palpitations, paroxysmal nocturnal dyspnea or shortness of breath Dermatological: negative for rash Respiratory: negative for cough or wheezing Urologic: negative for hematuria Abdominal: negative for nausea, vomiting, diarrhea, bright red blood per rectum, melena, or hematemesis Neurologic: negative for visual changes, syncope, or dizziness All other systems reviewed and are otherwise negative except as noted above.   Physical Exam:  Blood pressure 116/80, pulse (!) 105, resp. rate 16, height 5' 4"  (1.626 m), weight 219 lb 12.8 oz (99.7 kg), SpO2 94 %.  General appearance: alert, cooperative, no distress and moderately obese Neck: no carotid bruit and no JVD Lungs: diffuse inspiratory and expiratory wheezing and rhonchi, normal WOB Heart: regular rate and rhythm, S1, S2 normal, no murmur, click, rub or gallop Extremities: extremities normal, atraumatic, no cyanosis or edema Pulses: 2+ and symmetric Skin: Skin color, texture, turgor normal. No rashes or lesions Neurologic: Grossly normal  EKG not performed -- personally reviewed   ASSESSMENT AND PLAN:   1. Dyspnea 2/2 Long History of Poorly Controlled Asthma: ? Compliance with inhaler regimen. Pt has had frequent ED presentations  for acute asthma exacerbations, most recently last week at [facility]. BNP was normal at 14. Echo last fall showed normal LVEF, normal wall motion and valve anatomy. Suspect his dyspnea is primarily pulmonary related. Given persistent wheezing and visual impairment, he is still not a good candidate for stress testing at this time and CKD also limits evaluation by coronary CTA. He needs better management of his asthma before we can reconsider lexiscan nuclear stress testing. He has f/u with PCP tomorrow, and I encouraged him to keep this appt. He can f/u with Dr. Meda Coffee in 3 months to reassess symptoms .   Gary Buchanan, MHS [facility] 02/07/2018 11:07 AM

## 2018-02-07 NOTE — Patient Instructions (Signed)
Medication Instructions:  Your physician recommends that you continue on your current medications as directed. Please refer to the Current Medication list given to you today.  Labwork: None ordered today  Testing/Procedures: None ordered today  Follow-Up: Your physician recommends that you keep your scheduled follow-up appointment on May 30, 2018 at 8:20 am with Dr. Meda Coffee.  Any Other Special Instructions Will Be Listed Below (If Applicable).  If you need a refill on your cardiac medications before your next appointment, please call your pharmacy.

## 2018-02-08 ENCOUNTER — Ambulatory Visit: Payer: Medicaid Other | Admitting: Family Medicine

## 2018-02-16 ENCOUNTER — Institutional Professional Consult (permissible substitution): Payer: 59 | Admitting: Pulmonary Disease

## 2018-02-17 ENCOUNTER — Ambulatory Visit: Payer: Medicaid Other | Admitting: Internal Medicine

## 2018-02-21 ENCOUNTER — Telehealth: Payer: Self-pay | Admitting: Cardiology

## 2018-02-21 NOTE — Telephone Encounter (Signed)
Ok with me 

## 2018-02-21 NOTE — Telephone Encounter (Signed)
New Message   Patient would like to switch from Dr. Meda Coffee to Dr. Harrington Challenger Please advise

## 2018-02-22 NOTE — Telephone Encounter (Signed)
OK with me.

## 2018-02-26 ENCOUNTER — Other Ambulatory Visit: Payer: Self-pay | Admitting: Internal Medicine

## 2018-03-01 ENCOUNTER — Other Ambulatory Visit: Payer: Self-pay

## 2018-03-01 ENCOUNTER — Telehealth: Payer: Self-pay | Admitting: Internal Medicine

## 2018-03-01 NOTE — Telephone Encounter (Signed)
Pt c/o Shortness Of Breath: STAT if SOB developed within the last 24 hours or pt is noticeably SOB on the phone  1. Are you currently SOB (can you hear that pt is SOB on the phone)?no  2. How long have you been experiencing SOB? States Since Nov 13,2018  3. Are you SOB when sitting or when up moving around? Up and moving around   4. Are you currently experiencing any other symptoms? Chest pains , and headaches

## 2018-03-01 NOTE — Telephone Encounter (Signed)
Spoke with patient who called with c/o SOB which he states occurs daily after taking 10-15 steps or lifting anything greater than 10 lb. He also c/o bilateral lower extremity swelling. He denies weight gain, states he has lost 15 lb recently. He was evaluated for the same complaint on 02/07/18 by B. Rosita Fire, [place] and at that time he reported that he had an appointment with his PCP the next day. He states he was 10-15 minutes late for that appointment and they did not see him. He is not a good historian. He states his nebulizer has not helped him at all. He states nothing has been done about his poor kidney function or his other abnormal lab work. Patient does not want to see a male provider, only male. He has an appointment with Dr. Volanda Napoleon, a male MD, at  [facility] on Thursday. I advised that he should keep this follow-up appointment and he asked that I call back to his answering machine and leave the doctor's name, time, and location of appointment on Thursday. I agreed and called and left the patient a message with the requested information. The patient was thankful for my help.

## 2018-03-03 ENCOUNTER — Ambulatory Visit: Payer: 59 | Admitting: Family Medicine

## 2018-03-07 ENCOUNTER — Institutional Professional Consult (permissible substitution): Payer: 59 | Admitting: Internal Medicine

## 2018-03-17 ENCOUNTER — Encounter: Payer: Self-pay | Admitting: Family Medicine

## 2018-03-17 ENCOUNTER — Encounter: Payer: 59 | Admitting: Family Medicine
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
## 2018-03-18 ENCOUNTER — Encounter: Payer: Self-pay | Admitting: Family Medicine

## 2018-03-22 ENCOUNTER — Telehealth: Payer: Self-pay | Admitting: Family Medicine

## 2018-03-22 NOTE — Telephone Encounter (Signed)
Please advise 

## 2018-03-22 NOTE — Telephone Encounter (Signed)
It looks like he has a PCP at [facility].  I am declining to see this patient

## 2018-03-22 NOTE — Telephone Encounter (Signed)
Copied from [place] [id number]. Topic: Appointment Scheduling - Scheduling Inquiry for Clinic >> Mar 22, 2018 12:47 PM Boyd Kerbs wrote:  Reason for CRM: pt. Went to Dr. Volanda Napoleon and was very unhappy with her.  He is wanting to establish PCP at [place] with either Dr. Lorelei Pont or Debbrah Alar.  Please call pt. To schedule appt.

## 2018-03-22 NOTE — Telephone Encounter (Signed)
Please see note from 03/17/18--Erroneous encounter.  Pt never established with this provider.

## 2018-03-23 ENCOUNTER — Encounter: Payer: Self-pay | Admitting: Podiatry

## 2018-03-23 ENCOUNTER — Ambulatory Visit (INDEPENDENT_AMBULATORY_CARE_PROVIDER_SITE_OTHER): Payer: 59 | Admitting: Podiatry

## 2018-03-23 DIAGNOSIS — B351 Tinea unguium: Secondary | ICD-10-CM

## 2018-03-23 DIAGNOSIS — E118 Type 2 diabetes mellitus with unspecified complications: Secondary | ICD-10-CM

## 2018-03-23 DIAGNOSIS — L600 Ingrowing nail: Secondary | ICD-10-CM | POA: Diagnosis not present

## 2018-03-23 NOTE — Patient Instructions (Signed)
Seen for hypertrophic nails. All nails debrided. Return in 3 months or as needed.  

## 2018-03-23 NOTE — Progress Notes (Signed)
Subjective: 52 y.o. year old male patient presents complaining of painful nails. Patient requests toe nails trimmed.  His last blood glucose reading was 158 in 02/03/18.  HPI: Poor eye sight and disabled. Periodic gout attack. Cardiac arrythmia prevents him from bending down or doing physical activity. Patient is diagnosed with stage III Kidney disease and CHF.  Objective: Dermatologic: Thick yellow deformed nails x 10. Ingrown hallucal nails right great toe without infection. Vascular: Pedal pulses are all palpable. Orthopedic: No gross deformities noted. Neurologic: All epicritic and tactile sensations grossly intact.  Assessment: Dystrophic mycotic nails x 10. Painful toes.  Treatment: All mycotic nails debrided.  Return in 3 months or as needed.

## 2018-03-24 ENCOUNTER — Other Ambulatory Visit: Payer: Self-pay | Admitting: Internal Medicine

## 2018-03-24 NOTE — Telephone Encounter (Signed)
OK 

## 2018-03-25 ENCOUNTER — Institutional Professional Consult (permissible substitution): Payer: 59 | Admitting: Internal Medicine

## 2018-03-25 ENCOUNTER — Other Ambulatory Visit: Payer: Self-pay | Admitting: Internal Medicine

## 2018-03-25 NOTE — Telephone Encounter (Signed)
Copied from [place] [id number]. Topic: Quick Communication - See Telephone Encounter >> Mar 25, 2018  8:39 AM Genella Rife H wrote: CRM for notification. See Telephone encounter for: 03/25/18.  Called left vm for pt to call us back to set up np appt with Debbrah Alar

## 2018-03-25 NOTE — Telephone Encounter (Signed)
Okay to schedule NP appt w/ Melissa at his convenience.

## 2018-03-30 ENCOUNTER — Other Ambulatory Visit: Payer: Self-pay | Admitting: Internal Medicine

## 2018-04-15 ENCOUNTER — Other Ambulatory Visit: Payer: Self-pay | Admitting: Internal Medicine

## 2018-04-18 ENCOUNTER — Other Ambulatory Visit: Payer: Self-pay

## 2018-04-18 ENCOUNTER — Other Ambulatory Visit: Payer: Self-pay | Admitting: Internal Medicine

## 2018-04-18 MED ORDER — VALACYCLOVIR HCL 1 G PO TABS: 1000.0000 mg | ORAL_TABLET | Freq: Every day | ORAL | 0 refills | Status: DC

## 2018-04-18 NOTE — Telephone Encounter (Signed)
Patient left message on Ronda's voicemail. Was told when arrived for last appt that he could not be seen due to being over 15 minutes late. Patient is disabled and relies on transportation arranged through [facility].   Patient has a new patient appt to establish with [facility] in [facility] May 05/11/18. He has NOT seen them yet he has only spoken with them via phone.  Patient requests refill of Valcyclovir to last until his appt with [place].  Call back is [phone].  Danley Danker, RN [facility] RN)

## 2018-04-21 ENCOUNTER — Other Ambulatory Visit: Payer: Self-pay | Admitting: Cardiology

## 2018-04-21 ENCOUNTER — Other Ambulatory Visit: Payer: Self-pay | Admitting: Internal Medicine

## 2018-04-21 DIAGNOSIS — J45901 Unspecified asthma with (acute) exacerbation: Secondary | ICD-10-CM

## 2018-04-21 DIAGNOSIS — I10 Essential (primary) hypertension: Secondary | ICD-10-CM

## 2018-04-21 DIAGNOSIS — R072 Precordial pain: Secondary | ICD-10-CM

## 2018-04-21 DIAGNOSIS — R0602 Shortness of breath: Secondary | ICD-10-CM

## 2018-05-11 ENCOUNTER — Ambulatory Visit: Payer: 59 | Admitting: Family

## 2018-05-11 DIAGNOSIS — Z0289 Encounter for other administrative examinations: Secondary | ICD-10-CM

## 2018-05-27 ENCOUNTER — Telehealth: Payer: Self-pay | Admitting: Internal Medicine
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
## 2018-05-27 NOTE — Telephone Encounter (Signed)
Made several attempts to reach the patient. When I was able to reach him there was a lot of background discussion so we were having a difficult time hearing each other. I made him aware that he has refills at [facility] and he asked if this was [place]. I informed him once again of my name and that I was from the refill dept. He then stated "nah Im good" and hung the phone up.

## 2018-05-30 ENCOUNTER — Ambulatory Visit: Payer: 59 | Admitting: Cardiology

## 2018-05-30 ENCOUNTER — Ambulatory Visit: Payer: 59 | Admitting: Internal Medicine

## 2018-05-31 ENCOUNTER — Telehealth: Payer: Self-pay | Admitting: Cardiology

## 2018-05-31 DIAGNOSIS — J9622 Acute and chronic respiratory failure with hypercapnia: Secondary | ICD-10-CM | POA: Insufficient documentation

## 2018-05-31 DIAGNOSIS — J9621 Acute and chronic respiratory failure with hypoxia: Secondary | ICD-10-CM | POA: Insufficient documentation

## 2018-05-31 MED ORDER — FUROSEMIDE 20 MG PO TABS: ORAL_TABLET | ORAL | 0 refills | Status: DC

## 2018-05-31 NOTE — Telephone Encounter (Signed)
I would refill at 20 mg po daily and ok with switching to Dr Harrington Challenger.

## 2018-05-31 NOTE — Telephone Encounter (Signed)
Pt is requesting a refill on furosemide 20 mg tablet. Pt has 2 furosemide medications listed, one for 20 mg and one for 40 mg tablets. Could you please clarify which medication pt is supposed to be taking? Pt has an appt with Dr. Harrington Challenger on 09/12/18. Pt was which from Dr. Meda Coffee to Dr. Harrington Challenger. Please address

## 2018-05-31 NOTE — Telephone Encounter (Signed)
Pt's medication was sent to pt's pharmacy as requested. Confirmation received.  °

## 2018-05-31 NOTE — Telephone Encounter (Signed)
Dr. Meda Coffee, when you last saw this pt back in Oct 2018, you increased his lasix from 20 mg po daily to 40 mg po daily.   This has since been decreased by his PCP to lasix 20 mg po every morning for BP control and fluid retention. This pt also will soon be seeing Dr Harrington Challenger, for he requested to switch from you to Dr Harrington Challenger, and agreement took place.  Do you want to refill his lasix and if so, how much? Or should pt refer to his PCP Dr. Brett Albino, who recently decreased this dose? Please advise.

## 2018-05-31 NOTE — Telephone Encounter (Signed)
New Message     *STAT* If patient is at the pharmacy, call can be transferred to refill team.   1. Which medications need to be refilled? (please list name of each medication and dose if known) furosemide (LASIX) 20 MG tablet  2. Which pharmacy/location (including street and city if local pharmacy) is medication to be sent to? [id number] [place], [place] [address]  3. Do they need a 30 day or 90 day supply? [place]

## 2018-06-03 ENCOUNTER — Telehealth: Payer: Self-pay | Admitting: Internal Medicine

## 2018-06-03 MED ORDER — ALBUTEROL SULFATE (5 MG/ML) 0.5% IN NEBU
2.50 | INHALATION_SOLUTION | RESPIRATORY_TRACT | Status: DC
Start: 2018-06-03 — End: 2018-06-03

## 2018-06-03 MED ORDER — GENERIC EXTERNAL MEDICATION
1.00 | Status: DC
Start: ? — End: 2018-06-03

## 2018-06-03 MED ORDER — GENERIC EXTERNAL MEDICATION
1.00 | Status: DC
Start: 2018-06-03 — End: 2018-06-03

## 2018-06-03 MED ORDER — FLUTICASONE FUROATE-VILANTEROL 200-25 MCG/INH IN AEPB
1.00 | INHALATION_SPRAY | RESPIRATORY_TRACT | Status: DC
Start: 2018-06-04 — End: 2018-06-03

## 2018-06-03 MED ORDER — HYDRALAZINE HCL 20 MG/ML IJ SOLN
10.00 | INTRAMUSCULAR | Status: DC
Start: ? — End: 2018-06-03

## 2018-06-03 MED ORDER — LEVOFLOXACIN 500 MG PO TABS
500.00 | ORAL_TABLET | ORAL | Status: DC
Start: 2018-06-04 — End: 2018-06-03

## 2018-06-03 MED ORDER — UMECLIDINIUM BROMIDE 62.5 MCG/INH IN AEPB
1.00 | INHALATION_SPRAY | RESPIRATORY_TRACT | Status: DC
Start: 2018-06-04 — End: 2018-06-03

## 2018-06-03 MED ORDER — ACETAMINOPHEN 325 MG PO TABS
650.00 | ORAL_TABLET | ORAL | Status: DC
Start: ? — End: 2018-06-03

## 2018-06-03 MED ORDER — INSULIN LISPRO 100 UNIT/ML ~~LOC~~ SOLN
2.00 | SUBCUTANEOUS | Status: DC
Start: 2018-06-03 — End: 2018-06-03

## 2018-06-03 MED ORDER — HEPARIN SODIUM (PORCINE) 5000 UNIT/ML IJ SOLN
5000.00 | INTRAMUSCULAR | Status: DC
Start: 2018-06-03 — End: 2018-06-03

## 2018-06-03 MED ORDER — ALBUTEROL SULFATE (5 MG/ML) 0.5% IN NEBU
2.50 | INHALATION_SOLUTION | RESPIRATORY_TRACT | Status: DC
Start: ? — End: 2018-06-03

## 2018-06-03 MED ORDER — PREDNISONE 20 MG PO TABS
20.00 | ORAL_TABLET | ORAL | Status: DC
Start: 2018-06-03 — End: 2018-06-03

## 2018-06-03 MED ORDER — GUAIFENESIN ER 600 MG PO TB12
1200.00 | ORAL_TABLET | ORAL | Status: DC
Start: 2018-06-03 — End: 2018-06-03

## 2018-06-03 MED ORDER — CLONIDINE HCL 0.3 MG PO TABS
0.30 | ORAL_TABLET | ORAL | Status: DC
Start: 2018-06-03 — End: 2018-06-03

## 2018-06-03 MED ORDER — GLUCOSE 40 % PO GEL
15.00 g | ORAL | Status: DC
Start: ? — End: 2018-06-03

## 2018-06-03 MED ORDER — DEXTROSE 50 % IV SOLN
12.00 g | INTRAVENOUS | Status: DC
Start: ? — End: 2018-06-03

## 2018-06-03 MED ORDER — FAMOTIDINE 20 MG PO TABS
20.00 | ORAL_TABLET | ORAL | Status: DC
Start: 2018-06-03 — End: 2018-06-03

## 2018-06-03 MED ORDER — AMLODIPINE BESYLATE 5 MG PO TABS
10.00 | ORAL_TABLET | ORAL | Status: DC
Start: 2018-06-04 — End: 2018-06-03
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
## 2018-06-22 ENCOUNTER — Telehealth: Payer: Self-pay | Admitting: *Deleted

## 2018-06-22 ENCOUNTER — Other Ambulatory Visit: Payer: Self-pay | Admitting: Internal Medicine

## 2018-06-22 MED ORDER — VALACYCLOVIR HCL 1 G PO TABS: 1000.0000 mg | ORAL_TABLET | Freq: Every day | ORAL | 0 refills | Status: DC

## 2018-06-22 MED ORDER — CLONIDINE HCL 0.3 MG PO TABS: ORAL_TABLET | ORAL | 0 refills | Status: DC

## 2018-06-22 MED ORDER — AMLODIPINE BESYLATE 10 MG PO TABS: 10.0000 mg | ORAL_TABLET | Freq: Every day | ORAL | 0 refills | Status: DC

## 2018-06-22 NOTE — Telephone Encounter (Signed)
Medications refilled for 2 month supply. Thanks!

## 2018-06-22 NOTE — Telephone Encounter (Signed)
Pt states that he is aware that the is not a patient here anymore and that he has found a new PCP, appt is 08/08/18 (see appt desk).  [facility] was not [facility] right".  [facility] but they told him that he did not have any refills left on the following meds:  Amlodipine Clonidine Valcyclovir  He is wondering if we will send him in enough to last until his appt. Fleeger, Salome Spotted, CMA

## 2018-06-22 NOTE — Telephone Encounter (Signed)
LMOVM informing pt that a 2 month supply was sent in, but we will not be able to send anymore after this.  Be sure to keep appt with new PCP. Fleeger, Salome Spotted, CMA

## 2018-06-23 ENCOUNTER — Ambulatory Visit: Payer: Medicaid Other | Admitting: Podiatry

## 2018-06-28 ENCOUNTER — Ambulatory Visit: Payer: 59 | Admitting: Family

## 2018-07-11 ENCOUNTER — Institutional Professional Consult (permissible substitution): Payer: 59 | Admitting: Pulmonary Disease

## 2018-07-11 NOTE — Progress Notes (Unsigned)
Synopsis: Referred in *** for ***  Subjective:   PATIENT ID: Gary Buchanan GENDER: male DOB: June 21, 1966, MRN: [id number]   HPI  No chief complaint on file.   ***  Past Medical History:  Diagnosis Date  . A-fib ([place])   . Asthma   . Blindness of left eye   . Chronic kidney disease   . Diabetes mellitus without complication ([place])   . Gynecomastia   . Hypertension   . IGT (impaired glucose tolerance)   . Left knee pain   . Shortness of breath   . Sinus congestion      Family History  Problem Relation Age of Onset  . Diabetes Mother   . Hypertension Mother   . Hyperlipidemia Mother   . Diabetes Father   . Hypertension Father   . Diabetes Sister   . Hypertension Sister   . Diabetes Brother   . Hypertension Brother      Social History   Socioeconomic History  . Marital status: Single    Spouse name: Not on file  . Number of children: Not on file  . Years of education: Not on file  . Highest education level: Not on file  Occupational History  . Not on file  Social Needs  . Financial resource strain: Not on file  . Food insecurity:    Worry: Not on file    Inability: Not on file  . Transportation needs:    Medical: Not on file    Non-medical: Not on file  Tobacco Use  . Smoking status: Never Smoker  . Smokeless tobacco: Never Used  Substance and Sexual Activity  . Alcohol use: No  . Drug use: No  . Sexual activity: Not Currently    Birth control/protection: Abstinence  Lifestyle  . Physical activity:    Days per week: Not on file    Minutes per session: Not on file  . Stress: Not on file  Relationships  . Social connections:    Talks on phone: Not on file    Gets together: Not on file    Attends religious service: Not on file    Active member of club or organization: Not on file    Attends meetings of clubs or organizations: Not on file    Relationship status: Not on file  . Intimate partner violence:    Fear of current or ex partner: Not on  file    Emotionally abused: Not on file    Physically abused: Not on file    Forced sexual activity: Not on file  Other Topics Concern  . Not on file  Social History Narrative  . Not on file     Allergies  Allergen Reactions  . Latex Itching  . Lisinopril Swelling    Other reaction(s): Other Lip swelling  . Ace Inhibitors Swelling    Lips swells  . Gabapentin Other (See Comments)    Other reaction(s): Other (See Comments) Rapid heartbeat  Other reaction(s): Other Rapid heartbeat  Other reaction(s): Other (See Comments) Rapid heartbeat  Rapid heartbeat   . Tizanidine Other (See Comments)    Other reaction(s): Other chest pain  Other reaction(s): Other (See Comments) Chest pain Other reaction(s): Other (See Comments) Chest pain chest pain      Outpatient Medications Prior to Visit  Medication Sig Dispense Refill  . albuterol (PROVENTIL HFA;VENTOLIN HFA) 108 (90 BASE) MCG/ACT inhaler Inhale 2 puffs into the lungs every 6 (six) hours as needed.      Marland Kitchen  albuterol (PROVENTIL) (2.5 MG/3ML) 0.083% nebulizer solution Take 3 mLs (2.5 mg total) every 6 (six) hours as needed by nebulization. Use 3 times daily x 5 days, then every 6 hours as needed. 75 mL 0  . allopurinol (ZYLOPRIM) 300 MG tablet TAKE 1 TABLET BY MOUTH IN THE EVENING WITH SUPPER FOR GOUT PREVENTION 90 tablet 0  . amLODipine (NORVASC) 10 MG tablet Take 1 tablet (10 mg total) by mouth daily. 60 tablet 0  . aspirin EC 81 MG tablet Take 81 mg by mouth daily.    . Blood Glucose Monitoring Suppl (ACCU-CHEK AVIVA PLUS) w/Device KIT USE DAILY AS DIRECTED (Patient not taking: Reported on 03/17/2018) 1 kit 0  . Brinzolamide-Brimonidine 1-0.2 % SUSP Place 1 drop into the right eye 3 (three) times daily.    . budesonide (PULMICORT) 0.5 MG/2ML nebulizer solution Take 0.5 mg by nebulization 2 (two) times daily.    Marland Kitchen CARTIA XT 240 MG 24 hr capsule TAKE 1 CAPSULE(240 MG) BY MOUTH DAILY 90 capsule 3  . cetirizine (ZYRTEC) 10 MG tablet  Take 10 mg by mouth daily.    . cloNIDine (CATAPRES) 0.3 MG tablet TAKE 1 TABLET BY MOUTH TWICE DAILY FOR BLOOD PRESSURE 120 tablet 0  . DULERA 200-5 MCG/ACT AERO INHALE 2 PUFFS BY MOUTH INTO THE LUNGS TWICE DAILY 13 g 0  . ferrous sulfate 325 (65 FE) MG tablet Take 325 mg by mouth daily with breakfast.    . fluticasone (FLONASE) 50 MCG/ACT nasal spray Place 1 spray into both nostrils daily.    . fluticasone (FLOVENT HFA) 110 MCG/ACT inhaler Inhale 2 puffs into the lungs 2 (two) times daily. (Patient not taking: Reported on 03/17/2018) 1 Inhaler 12  . folic acid (FOLVITE) 008 MCG tablet Take 400 mcg by mouth daily.    . furosemide (LASIX) 20 MG tablet TAKE 1 TABLET BY MOUTH EVERY MORNING FOR BLOOD PRESSURE AND FLUID RETENTION. Please keep upcoming appt. Thank you 90 tablet 0  . ipratropium-albuterol (DUONEB) 0.5-2.5 (3) MG/3ML SOLN Take 3 mLs by nebulization every 8 (eight) hours as needed. (Patient not taking: Reported on 03/17/2018) 360 mL 2  . JANUVIA 50 MG tablet TAKE 1 TABLET BY MOUTH EVERY DAY (Patient not taking: Reported on 03/17/2018) 90 tablet 0  . magnesium gluconate (MAGONATE) 500 MG tablet Take 500 mg by mouth daily.    . montelukast (SINGULAIR) 10 MG tablet TAKE 1 TABLET(10 MG) BY MOUTH DAILY (Patient not taking: Reported on 03/17/2018) 90 tablet 0  . Multiple Vitamins-Minerals (SENTRY SENIOR PO) Take 1 tablet by mouth daily.    . predniSONE (DELTASONE) 50 MG tablet Take 1 tab daily with breakfast for the next 4 days. (Patient not taking: Reported on 03/17/2018) 4 tablet 0  . pyridOXINE (VITAMIN B-6) 100 MG tablet Take 100 mg by mouth daily.    . timolol (BETIMOL) 0.25 % ophthalmic solution Place 1-2 drops into the right eye 2 (two) times daily.    . travoprost, benzalkonium, (TRAVATAN) 0.004 % ophthalmic solution Place 1 drop into the right eye at bedtime.    . valACYclovir (VALTREX) 1000 MG tablet Take 1 tablet (1,000 mg total) by mouth daily. 60 tablet 0  . vitamin E (VITAMIN E) 200  UNIT capsule Take 200 Units by mouth daily.     No facility-administered medications prior to visit.     ROS    Objective:  Physical Exam   There were no vitals filed for this visit.  ***  CBC  Component Value Date/Time   WBC 15.6 (H) 02/03/2018 1320   RBC 4.38 02/03/2018 1320   HGB 12.6 (L) 02/03/2018 1320   HGB 13.3 10/11/2017 1121   HCT 37.3 (L) 02/03/2018 1320   HCT 39.0 10/11/2017 1121   PLT 289 02/03/2018 1320   PLT 267 10/11/2017 1121   MCV 85.2 02/03/2018 1320   MCV 83 10/11/2017 1121   MCH 28.8 02/03/2018 1320   MCHC 33.8 02/03/2018 1320   RDW 16.2 (H) 02/03/2018 1320   RDW 16.7 (H) 10/11/2017 1121   LYMPHSABS 3.0 02/03/2018 1320   LYMPHSABS 2.3 10/11/2017 1121   MONOABS 1.6 (H) 02/03/2018 1320   EOSABS 1.1 (H) 02/03/2018 1320   EOSABS 0.6 (H) 10/11/2017 1121   BASOSABS 0.2 (H) 02/03/2018 1320   BASOSABS 0.1 10/11/2017 1121     Chest imaging:  PFT:  Labs:  Path:  Echo:  Heart Catheterization:       Assessment & Plan:   No diagnosis found.  Discussion: ***    Current Outpatient Medications:  .  albuterol (PROVENTIL HFA;VENTOLIN HFA) 108 (90 BASE) MCG/ACT inhaler, Inhale 2 puffs into the lungs every 6 (six) hours as needed.  , Disp: , Rfl:  .  albuterol (PROVENTIL) (2.5 MG/3ML) 0.083% nebulizer solution, Take 3 mLs (2.5 mg total) every 6 (six) hours as needed by nebulization. Use 3 times daily x 5 days, then every 6 hours as needed., Disp: 75 mL, Rfl: 0 .  allopurinol (ZYLOPRIM) 300 MG tablet, TAKE 1 TABLET BY MOUTH IN THE EVENING WITH SUPPER FOR GOUT PREVENTION, Disp: 90 tablet, Rfl: 0 .  amLODipine (NORVASC) 10 MG tablet, Take 1 tablet (10 mg total) by mouth daily., Disp: 60 tablet, Rfl: 0 .  aspirin EC 81 MG tablet, Take 81 mg by mouth daily., Disp: , Rfl:  .  Blood Glucose Monitoring Suppl (ACCU-CHEK AVIVA PLUS) w/Device KIT, USE DAILY AS DIRECTED (Patient not taking: Reported on 03/17/2018), Disp: 1 kit, Rfl: 0 .   Brinzolamide-Brimonidine 1-0.2 % SUSP, Place 1 drop into the right eye 3 (three) times daily., Disp: , Rfl:  .  budesonide (PULMICORT) 0.5 MG/2ML nebulizer solution, Take 0.5 mg by nebulization 2 (two) times daily., Disp: , Rfl:  .  CARTIA XT 240 MG 24 hr capsule, TAKE 1 CAPSULE(240 MG) BY MOUTH DAILY, Disp: 90 capsule, Rfl: 3 .  cetirizine (ZYRTEC) 10 MG tablet, Take 10 mg by mouth daily., Disp: , Rfl:  .  cloNIDine (CATAPRES) 0.3 MG tablet, TAKE 1 TABLET BY MOUTH TWICE DAILY FOR BLOOD PRESSURE, Disp: 120 tablet, Rfl: 0 .  DULERA 200-5 MCG/ACT AERO, INHALE 2 PUFFS BY MOUTH INTO THE LUNGS TWICE DAILY, Disp: 13 g, Rfl: 0 .  ferrous sulfate 325 (65 FE) MG tablet, Take 325 mg by mouth daily with breakfast., Disp: , Rfl:  .  fluticasone (FLONASE) 50 MCG/ACT nasal spray, Place 1 spray into both nostrils daily., Disp: , Rfl:  .  fluticasone (FLOVENT HFA) 110 MCG/ACT inhaler, Inhale 2 puffs into the lungs 2 (two) times daily. (Patient not taking: Reported on 03/17/2018), Disp: 1 Inhaler, Rfl: 12 .  folic acid (FOLVITE) 176 MCG tablet, Take 400 mcg by mouth daily., Disp: , Rfl:  .  furosemide (LASIX) 20 MG tablet, TAKE 1 TABLET BY MOUTH EVERY MORNING FOR BLOOD PRESSURE AND FLUID RETENTION. Please keep upcoming appt. Thank you, Disp: 90 tablet, Rfl: 0 .  ipratropium-albuterol (DUONEB) 0.5-2.5 (3) MG/3ML SOLN, Take 3 mLs by nebulization every 8 (eight) hours as needed. (Patient  not taking: Reported on 03/17/2018), Disp: 360 mL, Rfl: 2 .  JANUVIA 50 MG tablet, TAKE 1 TABLET BY MOUTH EVERY DAY (Patient not taking: Reported on 03/17/2018), Disp: 90 tablet, Rfl: 0 .  magnesium gluconate (MAGONATE) 500 MG tablet, Take 500 mg by mouth daily., Disp: , Rfl:  .  montelukast (SINGULAIR) 10 MG tablet, TAKE 1 TABLET(10 MG) BY MOUTH DAILY (Patient not taking: Reported on 03/17/2018), Disp: 90 tablet, Rfl: 0 .  Multiple Vitamins-Minerals (SENTRY SENIOR PO), Take 1 tablet by mouth daily., Disp: , Rfl:  .  predniSONE (DELTASONE)  50 MG tablet, Take 1 tab daily with breakfast for the next 4 days. (Patient not taking: Reported on 03/17/2018), Disp: 4 tablet, Rfl: 0 .  pyridOXINE (VITAMIN B-6) 100 MG tablet, Take 100 mg by mouth daily., Disp: , Rfl:  .  timolol (BETIMOL) 0.25 % ophthalmic solution, Place 1-2 drops into the right eye 2 (two) times daily., Disp: , Rfl:  .  travoprost, benzalkonium, (TRAVATAN) 0.004 % ophthalmic solution, Place 1 drop into the right eye at bedtime., Disp: , Rfl:  .  valACYclovir (VALTREX) 1000 MG tablet, Take 1 tablet (1,000 mg total) by mouth daily., Disp: 60 tablet, Rfl: 0 .  vitamin E (VITAMIN E) 200 UNIT capsule, Take 200 Units by mouth daily., Disp: , Rfl:

## 2018-08-07 ENCOUNTER — Encounter: Payer: Self-pay | Admitting: Family

## 2018-08-07 NOTE — Progress Notes (Deleted)
   Subjective:    Patient ID: Gary Buchanan, male    DOB: [birth year], 52 y.o.   MRN: [id number]  HPI  Patient is a 52 yr old male who presents today to establish care:  Pmhx is significant for the following:  HTN-  Gout-   DM2-  CKD stage III  AF  Asthma/COPD-   He is also s/p adrenalectomy-   Of note he was admitted 05/31/18 to [facility] 6/4-6/9 with respiratory arrest. He was intubated, treated with abx, steroids. This occurred in setting of "running out of his meds for a few months."  (reviewed discharge summary in care everywhere). Upon discharge he was advised to follow up with Dr. Verdie Mosher at [facility] pulmonary.      Review of Systems     Objective:   Physical Exam        Assessment & Plan:

## 2018-08-08 ENCOUNTER — Encounter

## 2018-08-08 ENCOUNTER — Ambulatory Visit: Payer: 59 | Admitting: Family

## 2018-08-09 ENCOUNTER — Telehealth: Payer: Self-pay

## 2018-08-09 NOTE — Telephone Encounter (Signed)
Chief Strategy Officer received call from [place], Buyer, retail at [facility], about pt. who had contacted their office complaining about not being seen by providers at the [facility] location. Pt. had appointment with Melissa yesterday, but it was cancelled. Pt. has several documented no shows/cancellations at various practices including cardiology, pulmonology, and podiatry as well. Pt. apparently denies transportation issue per Ronecia, "except once". Pt. provided Ronecia with his phone number [phone], which is not the number provided in our records. Routed to [employer], front end Freight forwarder, and [employer], Shanea,to review appointment history and any possible pt. contact he may have had with our office at this point.

## 2018-08-09 NOTE — Telephone Encounter (Signed)
Patient no showed initial NP appt with Melissa on 5/15. Patient canceled 7/2 NP appt on 6/26 with [facility]. stating he has another appt that date. Patient was scheduled for NP appt again with Melissa on 8/12 @ 10AM. Appt was canceled same day at 3:48pm (No Showed) with [facility] states transportation issues.  Melissa asked that the patient not to scheduled again with her. Please see documented encounter on 03/17/18 with Banks at [place].

## 2018-08-10 NOTE — Telephone Encounter (Signed)
Noted  

## 2018-08-10 NOTE — Telephone Encounter (Signed)
Author phoned pt. to notify him that the [facility] office will not be seeing him d/t his repeated no-show/cancellation history in establishing care. Considering pt history with [place] office, author directed pt. to [facility], which is close to his home, for evaluation and treatment. Pt. stated "no, I don't do [facility]". Pt. then offered to give contact information for [facility], and pt. stated "no thanks". Author encouraged pt. to seek help so that he can resume with the medications he needs, but pt. seemed disinterested. Routed to [place], NP and [place], Engineer, building services as Juluis Rainier.

## 2018-08-30 ENCOUNTER — Telehealth: Payer: Self-pay | Admitting: Family Medicine

## 2018-08-30 NOTE — Telephone Encounter (Signed)
Attempted to call pt. To inform them of Dr. Jac Canavan leave of absence and reschedule. Neither number available.

## 2018-08-31 NOTE — Telephone Encounter (Signed)
**  Patient needs to reschedule their apt on 09/05/18 with a different provider or get an appointment with Dr Brigitte Pulse when she comes back in Oct. If she only has same day slots please call the clinic and ask for [name] so I can open a slot for you.**CC

## 2018-09-05 ENCOUNTER — Ambulatory Visit: Payer: 59 | Admitting: Family Medicine

## 2018-09-12 ENCOUNTER — Ambulatory Visit: Payer: 59 | Admitting: Internal Medicine

## 2018-10-28 ENCOUNTER — Telehealth: Payer: Self-pay | Admitting: Cardiology

## 2018-10-28 NOTE — Telephone Encounter (Signed)
New Message    Pt c/o medication issue:  1. Name of Medication: dilitiazem  2. How are you currently taking this medication (dosage and times per day)?   3. Are you having a reaction (difficulty breathing--STAT)?   4. What is your medication issue? Patient is requesting record information for when he started and stopped this medication. Please call to discuss.

## 2018-10-28 NOTE — Telephone Encounter (Signed)
Pt was wanting to know when [place] was started .Appears Cartia script was sent in May 31 which has been changed from diltiazem ./  Pt requesting note reflecting  this change . Will mail this note to pt cy

## 2018-11-29 ENCOUNTER — Telehealth: Payer: Self-pay

## 2018-11-29 NOTE — Telephone Encounter (Signed)
Copied from [place] [phone]. Topic: Appointment Scheduling - Scheduling Inquiry for Clinic >> Nov 29, 2018  2:27 PM Scherrie Gerlach wrote: Reason for CRM: pt has had several no shows with [facility], and she will not see the pt again.  Pt had appt with Dr Volanda Napoleon on 03/17/18,but I do not see any OV notes, or encounter info.  Pt told me "that dr had an attitude, and he will not see her again." Pt states he wants a white male doctor, and I felt it best to ask Dr Bryan Lemma if she will even accept this pt, as pt has several no show appt history. Please advise if OK for pt to schedule

## 2018-11-30 NOTE — Telephone Encounter (Signed)
I will see pt so ok to put on my schedule. If pt no-shows the appt, he will need to reschedule with another provider

## 2018-11-30 NOTE — Telephone Encounter (Signed)
I called and spoke to patient. Patient informed that Dr. Bryan Lemma will see patient, if patient no shows, he will not be able to reschedule with any provider in the office. Patient verbalized understanding. Patient scheduled for 12/09/18 @ 10:30am.

## 2018-12-09 ENCOUNTER — Ambulatory Visit (INDEPENDENT_AMBULATORY_CARE_PROVIDER_SITE_OTHER): Payer: 59 | Admitting: Family Medicine

## 2018-12-09 ENCOUNTER — Encounter: Payer: Self-pay | Admitting: Family Medicine

## 2018-12-09 VITALS — BP 132/110 | HR 86 | Temp 98.3°F | Ht 61.0 in | Wt 206.8 lb

## 2018-12-09 DIAGNOSIS — Z23 Encounter for immunization: Secondary | ICD-10-CM

## 2018-12-09 DIAGNOSIS — Z1159 Encounter for screening for other viral diseases: Secondary | ICD-10-CM

## 2018-12-09 DIAGNOSIS — I509 Heart failure, unspecified: Secondary | ICD-10-CM

## 2018-12-09 DIAGNOSIS — E118 Type 2 diabetes mellitus with unspecified complications: Secondary | ICD-10-CM

## 2018-12-09 DIAGNOSIS — I10 Essential (primary) hypertension: Secondary | ICD-10-CM | POA: Diagnosis not present

## 2018-12-09 DIAGNOSIS — E2609 Other primary hyperaldosteronism: Secondary | ICD-10-CM

## 2018-12-09 DIAGNOSIS — J4540 Moderate persistent asthma, uncomplicated: Secondary | ICD-10-CM

## 2018-12-09 DIAGNOSIS — D631 Anemia in chronic kidney disease: Secondary | ICD-10-CM

## 2018-12-09 DIAGNOSIS — N183 Chronic kidney disease, stage 3 unspecified: Secondary | ICD-10-CM

## 2018-12-09 DIAGNOSIS — Z7689 Persons encountering health services in other specified circumstances: Secondary | ICD-10-CM

## 2018-12-09 DIAGNOSIS — M1A3720 Chronic gout due to renal impairment, left ankle and foot, without tophus (tophi): Secondary | ICD-10-CM

## 2018-12-09 MED ORDER — AMLODIPINE BESYLATE 10 MG PO TABS: 10.0000 mg | ORAL_TABLET | Freq: Every day | ORAL | 3 refills | Status: DC

## 2018-12-09 MED ORDER — ALLOPURINOL 300 MG PO TABS: 300.0000 mg | ORAL_TABLET | Freq: Every day | ORAL | 3 refills | Status: DC

## 2018-12-09 MED ORDER — CETIRIZINE HCL 10 MG PO TABS: 10.0000 mg | ORAL_TABLET | Freq: Every day | ORAL | 3 refills | Status: DC

## 2018-12-09 MED ORDER — IPRATROPIUM-ALBUTEROL 0.5-2.5 (3) MG/3ML IN SOLN: 3.0000 mL | Freq: Three times a day (TID) | RESPIRATORY_TRACT | 2 refills | Status: DC | PRN

## 2018-12-09 MED ORDER — MAGNESIUM GLUCONATE 500 MG PO TABS: 500.0000 mg | ORAL_TABLET | Freq: Every day | ORAL | 3 refills | Status: DC

## 2018-12-09 MED ORDER — MONTELUKAST SODIUM 10 MG PO TABS: ORAL_TABLET | ORAL | 3 refills | Status: DC

## 2018-12-09 MED ORDER — FUROSEMIDE 20 MG PO TABS: ORAL_TABLET | ORAL | 3 refills | Status: DC

## 2018-12-09 MED ORDER — SITAGLIPTIN PHOSPHATE 50 MG PO TABS: 50.0000 mg | ORAL_TABLET | Freq: Every day | ORAL | 3 refills | Status: DC

## 2018-12-09 MED ORDER — ALBUTEROL SULFATE HFA 108 (90 BASE) MCG/ACT IN AERS: 2.0000 | INHALATION_SPRAY | Freq: Four times a day (QID) | RESPIRATORY_TRACT | 3 refills | Status: DC | PRN

## 2018-12-09 MED ORDER — FLUTICASONE PROPIONATE HFA 110 MCG/ACT IN AERO: 2.0000 | INHALATION_SPRAY | Freq: Two times a day (BID) | RESPIRATORY_TRACT | 12 refills | Status: DC

## 2018-12-09 NOTE — Progress Notes (Signed)
Gary Buchanan is a 52 y.o. male  Chief Complaint  Patient presents with  . Establish Care    would like to establish care , trouble breathing has asthma problems    HPI: Gary Buchanan is a 52 y.o. male here as a new patient to establish care with our office.  He has multiple medical issues including HTN, CHF, mod persistent asthma, CKD, retinopathy, DM, gout among others. He was recently seen in the ER on 12/01/18 for SOB. He was discharged home on abx, prednisone taper, flonase. He uses duoneb and albuterol inhaler PRN.   He is out of all of his medications and needs refills.  He needs referrals for cardio, nephro, endo. He agrees to pneumonia (pneumovax) and Tdap vaccines today. He states he is allergic to latex and had a "reaction" to the flu shot in the past and so declines that today. He needs an eye exam and foot exam.   Specialists: cardio (Dr. Dorris Carnes), podiatry (Dr. Caffie Pinto), ophthalmology (Dr. Antionette Fairy)  Last CPE, labs: due Last colonoscopy: never; agrees to cologuard  Med refills needed today: pt states he is out of all meds   Past Medical History:  Diagnosis Date  . A-fib ([place])   . Asthma   . Blindness of left eye   . Chronic kidney disease   . Diabetes mellitus without complication ([place])   . Gynecomastia   . Hypertension   . Left knee pain     Past Surgical History:  Procedure Laterality Date  . ADRENALECTOMY    . EYE SURGERY    . HERNIA REPAIR    . SINUS ENDO WITH FUSION  10/17/2012   Procedure: SINUS ENDO WITH FUSION;  Surgeon: Ascencion Dike, MD;  Location: [facility];  Service: ENT;  Laterality: Bilateral;  Bilateral Ethmoidectomy,  Bilateral Maxillary Antrostomy, Bilateral Frontal Recess Exploration,   W/ Fusion Protocol    Social History   Socioeconomic History  . Marital status: Single    Spouse name: Not on file  . Number of children: Not on file  . Years of education: Not on file  . Highest education level: Not on file    Occupational History  . Not on file  Social Needs  . Financial resource strain: Not on file  . Food insecurity:    Worry: Not on file    Inability: Not on file  . Transportation needs:    Medical: Not on file    Non-medical: Not on file  Tobacco Use  . Smoking status: Never Smoker  . Smokeless tobacco: Never Used  Substance and Sexual Activity  . Alcohol use: No  . Drug use: No  . Sexual activity: Not Currently    Birth control/protection: Abstinence  Lifestyle  . Physical activity:    Days per week: Not on file    Minutes per session: Not on file  . Stress: Not on file  Relationships  . Social connections:    Talks on phone: Not on file    Gets together: Not on file    Attends religious service: Not on file    Active member of club or organization: Not on file    Attends meetings of clubs or organizations: Not on file    Relationship status: Not on file  . Intimate partner violence:    Fear of current or ex partner: Not on file    Emotionally abused: Not on file    Physically abused: Not on file  Forced sexual activity: Not on file  Other Topics Concern  . Not on file  Social History Narrative  . Not on file    Family History  Problem Relation Age of Onset  . Diabetes Mother   . Hypertension Mother   . Hyperlipidemia Mother   . Diabetes Father   . Hypertension Father   . Diabetes Sister   . Hypertension Sister   . Diabetes Brother   . Hypertension Brother      There is no immunization history for the selected administration types on file for this patient.  Outpatient Encounter Medications as of 12/09/2018  Medication Sig  . albuterol (PROVENTIL) (2.5 MG/3ML) 0.083% nebulizer solution Take 3 mLs (2.5 mg total) every 6 (six) hours as needed by nebulization. Use 3 times daily x 5 days, then every 6 hours as needed.  Marland Kitchen allopurinol (ZYLOPRIM) 300 MG tablet Take 1 tablet (300 mg total) by mouth daily.  Marland Kitchen amLODipine (NORVASC) 10 MG tablet Take 1 tablet (10  mg total) by mouth daily.  Marland Kitchen aspirin EC 81 MG tablet Take 81 mg by mouth daily.  . Brinzolamide-Brimonidine 1-0.2 % SUSP Place 1 drop into the right eye 3 (three) times daily.  . cetirizine (ZYRTEC) 10 MG tablet Take 1 tablet (10 mg total) by mouth daily.  . ferrous sulfate 325 (65 FE) MG tablet Take 325 mg by mouth daily with breakfast.  . fluticasone (FLONASE) 50 MCG/ACT nasal spray Place 1 spray into both nostrils daily.  . folic acid (FOLVITE) 151 MCG tablet Take 400 mcg by mouth daily.  . furosemide (LASIX) 20 MG tablet TAKE 1 TABLET BY MOUTH EVERY MORNING FOR BLOOD PRESSURE AND FLUID RETENTION.  Marland Kitchen ipratropium-albuterol (DUONEB) 0.5-2.5 (3) MG/3ML SOLN Take 3 mLs by nebulization every 8 (eight) hours as needed.  . magnesium gluconate (MAGONATE) 500 MG tablet Take 1 tablet (500 mg total) by mouth daily.  . montelukast (SINGULAIR) 10 MG tablet TAKE 1 TABLET(10 MG) BY MOUTH DAILY  . Multiple Vitamins-Minerals (SENTRY SENIOR PO) Take 1 tablet by mouth daily.  Marland Kitchen pyridOXINE (VITAMIN B-6) 100 MG tablet Take 100 mg by mouth daily.  . sitaGLIPtin (JANUVIA) 50 MG tablet Take 1 tablet (50 mg total) by mouth daily.  . timolol (BETIMOL) 0.25 % ophthalmic solution Place 1-2 drops into the right eye 2 (two) times daily.  . travoprost, benzalkonium, (TRAVATAN) 0.004 % ophthalmic solution Place 1 drop into the right eye at bedtime.  . valACYclovir (VALTREX) 1000 MG tablet Take 1 tablet (1,000 mg total) by mouth daily.  . vitamin E (VITAMIN E) 200 UNIT capsule Take 200 Units by mouth daily.  . [DISCONTINUED] allopurinol (ZYLOPRIM) 300 MG tablet TAKE 1 TABLET BY MOUTH IN THE EVENING WITH SUPPER FOR GOUT PREVENTION  . [DISCONTINUED] amLODipine (NORVASC) 10 MG tablet Take 1 tablet (10 mg total) by mouth daily.  . [DISCONTINUED] budesonide (PULMICORT) 0.5 MG/2ML nebulizer solution Take 0.5 mg by nebulization 2 (two) times daily.  . [DISCONTINUED] CARTIA XT 240 MG 24 hr capsule TAKE 1 CAPSULE(240 MG) BY MOUTH  DAILY  . [DISCONTINUED] cetirizine (ZYRTEC) 10 MG tablet Take 10 mg by mouth daily.  . [DISCONTINUED] cloNIDine (CATAPRES) 0.3 MG tablet TAKE 1 TABLET BY MOUTH TWICE DAILY FOR BLOOD PRESSURE  . [DISCONTINUED] DULERA 200-5 MCG/ACT AERO INHALE 2 PUFFS BY MOUTH INTO THE LUNGS TWICE DAILY  . [DISCONTINUED] furosemide (LASIX) 20 MG tablet TAKE 1 TABLET BY MOUTH EVERY MORNING FOR BLOOD PRESSURE AND FLUID RETENTION. Please keep upcoming appt. Thank  you  . [DISCONTINUED] ipratropium-albuterol (DUONEB) 0.5-2.5 (3) MG/3ML SOLN Take 3 mLs by nebulization every 8 (eight) hours as needed.  . [DISCONTINUED] JANUVIA 50 MG tablet TAKE 1 TABLET BY MOUTH EVERY DAY  . [DISCONTINUED] magnesium gluconate (MAGONATE) 500 MG tablet Take 500 mg by mouth daily.  . [DISCONTINUED] montelukast (SINGULAIR) 10 MG tablet TAKE 1 TABLET(10 MG) BY MOUTH DAILY  . [DISCONTINUED] predniSONE (DELTASONE) 50 MG tablet Take 1 tab daily with breakfast for the next 4 days.  Marland Kitchen albuterol (PROVENTIL HFA;VENTOLIN HFA) 108 (90 Base) MCG/ACT inhaler Inhale 2 puffs into the lungs every 6 (six) hours as needed.  . Blood Glucose Monitoring Suppl (ACCU-CHEK AVIVA PLUS) w/Device KIT USE DAILY AS DIRECTED (Patient not taking: Reported on 12/09/2018)  . fluticasone (FLOVENT HFA) 110 MCG/ACT inhaler Inhale 2 puffs into the lungs 2 (two) times daily.  . [DISCONTINUED] albuterol (PROVENTIL HFA;VENTOLIN HFA) 108 (90 BASE) MCG/ACT inhaler Inhale 2 puffs into the lungs every 6 (six) hours as needed.    . [DISCONTINUED] fluticasone (FLOVENT HFA) 110 MCG/ACT inhaler Inhale 2 puffs into the lungs 2 (two) times daily. (Patient not taking: Reported on 12/09/2018)   No facility-administered encounter medications on file as of 12/09/2018.      ROS: Gen: no fever, chills  Skin: no rash, itching ENT: no ear pain, ear drainage; + nasal congestion, rhinorrhea, PND; no sore throat Eyes: poor vision in Lt eye Resp: no cough, wheeze; + SOB CV: no CP, palpitations,  LE edema,  GI: no heartburn, n/v/d/c, abd pain GU: no dysuria, urgency, frequency, hematuria  MSK: + B/L knee pain; no myalgias, back pain Neuro: no dizziness, headache, weakness Psych: no depression, anxiety, insomnia   Allergies  Allergen Reactions  . Latex Itching  . Lisinopril Swelling    Other reaction(s): Other Lip swelling  . Ace Inhibitors Swelling    Lips swells  . Gabapentin Other (See Comments)    Other reaction(s): Other (See Comments) Rapid heartbeat  Other reaction(s): Other Rapid heartbeat  Other reaction(s): Other (See Comments) Rapid heartbeat  Rapid heartbeat   . Tizanidine Other (See Comments)    Other reaction(s): Other chest pain  Other reaction(s): Other (See Comments) Chest pain Other reaction(s): Other (See Comments) Chest pain chest pain     BP (!) 132/110 (BP Location: Left Arm, Patient Position: Sitting, Cuff Size: Normal)   Pulse 86   Temp 98.3 F (36.8 C) (Oral)   Ht _0  (1.549 m)   Wt 206 lb 12.8 oz (93.8 kg)   SpO2 97%   BMI 39.07 kg/m   BP Readings from Last 3 Encounters:  12/09/18 (!) 132/110  03/17/18 102/64  02/07/18 116/80     Physical Exam  Constitutional: He is oriented to person, place, and time. He appears well-developed. No distress.  Neck: No JVD present.  Cardiovascular: Normal rate, regular rhythm and intact distal pulses.  Pulmonary/Chest: Effort normal. No respiratory distress. He has wheezes (B/L wheeze). He has no rhonchi. He has no rales.  Abdominal: Soft. Bowel sounds are normal. He exhibits no distension. There is no abdominal tenderness.  Musculoskeletal: Normal range of motion.        General: No edema.  Lymphadenopathy:    He has no cervical adenopathy.  Neurological: He is alert and oriented to person, place, and time.  Psychiatric: He has a normal mood and affect.     A/P:  1. Essential hypertension - elevated today, but pt has been off BP med - some confusion as  to pts med regimen. Cardia is  listed on med list but pt states he doesn't take it and "has never heard of it". He also used to take clonodine but has not in 9-6QI - Basic metabolic panel - Ambulatory referral to Cardiology Refill: - amLODipine (NORVASC) 10 MG tablet; Take 1 tablet (10 mg total) by mouth daily.  Dispense: 90 tablet; Refill: 3 - f/u in 4 wks or sooner PRN  2. Encounter to establish care with new doctor  3. Other congestive heart failure ([place]) - Ambulatory referral to Cardiology Refill: - furosemide (LASIX) 20 MG tablet; TAKE 1 TABLET BY MOUTH EVERY MORNING FOR BLOOD PRESSURE AND FLUID RETENTION.  Dispense: 90 tablet; Refill: 3  4. Moderate persistent asthma, unspecified whether complicated - pt has been out of meds and recently seen in ER for exacerbation. He states symptoms have improved with prednisone taper but not back to baseline. He does not have any maintenance inhaler, PRN inhaler or neb at home Refill: - montelukast (SINGULAIR) 10 MG tablet; TAKE 1 TABLET(10 MG) BY MOUTH DAILY  Dispense: 90 tablet; Refill: 3 - albuterol (PROVENTIL HFA;VENTOLIN HFA) 108 (90 Base) MCG/ACT inhaler; Inhale 2 puffs into the lungs every 6 (six) hours as needed.  Dispense: 1 Inhaler; Refill: 3 - cetirizine (ZYRTEC) 10 MG tablet; Take 1 tablet (10 mg total) by mouth daily.  Dispense: 90 tablet; Refill: 3 - fluticasone (FLOVENT HFA) 110 MCG/ACT inhaler; Inhale 2 puffs into the lungs 2 (two) times daily.  Dispense: 1 Inhaler; Refill: 12 - ipratropium-albuterol (DUONEB) 0.5-2.5 (3) MG/3ML SOLN; Take 3 mLs by nebulization every 8 (eight) hours as needed.  Dispense: 360 mL; Refill: 2 - f/u in 4 wks or sooner PRN  5. Type 2 diabetes mellitus with complication, without long-term current use of insulin (HCC) - Lipid panel - Hemoglobin A1c - Microalbumin / creatinine urine ratio - Ambulatory referral to Nephrology - Ambulatory referral to Cardiology Refill: - sitaGLIPtin (JANUVIA) 50 MG tablet; Take 1 tablet (50 mg total) by  mouth daily.  Dispense: 90 tablet; Refill: 3 - foot exam at next OV, will also need ophthalmology referral or f/u - f/u in 4 wks  6. Chronic kidney disease, stage III (moderate) (HCC) - BMP - Microalbumin / creatinine urine ratio - Ambulatory referral to Nephrology - magnesium gluconate (MAGONATE) 500 MG tablet; Take 1 tablet (500 mg total) by mouth daily.  Dispense: 90 tablet; Refill: 3 - Ambulatory referral to Endocrinology  7. Anemia due to stage 3 chronic kidney disease ([place]) - CBC - Ambulatory referral to Nephrology  8. Need for hepatitis C screening test - Hepatitis c antibody (reflex)  9. Need for pneumococcal vaccination - Pneumococcal polysaccharide vaccine 23-valent greater than or equal to 2yo subcutaneous/IM  10. 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  Dispense: 90 tablet; Refill: 3  11. Need for Tdap vaccination - Tdap vaccine greater than or equal to 7yo IM  12. 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 Pt expressed understand. All questions answered.  I spent 45 min with the patient today and greater than 50% was spent in counseling, coordination of care, education

## 2018-12-30 ENCOUNTER — Telehealth: Payer: Self-pay | Admitting: Family Medicine

## 2018-12-30 ENCOUNTER — Other Ambulatory Visit: Payer: Self-pay

## 2018-12-30 MED ORDER — VALACYCLOVIR HCL 1 G PO TABS: 1000.0000 mg | ORAL_TABLET | Freq: Every day | ORAL | 0 refills | Status: DC

## 2018-12-30 NOTE — Telephone Encounter (Signed)
Called patient.  Advised refill completed. Patient voiced understanding we value diversity and can't predetermine who will be involved in his care.  Patient thanked me for helping him.
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
## 2019-01-02 ENCOUNTER — Encounter: Payer: Self-pay | Admitting: Internal Medicine

## 2019-01-12 ENCOUNTER — Ambulatory Visit: Payer: 59 | Admitting: Family Medicine

## 2019-01-12 ENCOUNTER — Other Ambulatory Visit: Payer: Self-pay | Admitting: Family Medicine

## 2019-01-30 ENCOUNTER — Ambulatory Visit: Payer: 59 | Admitting: Internal Medicine

## 2019-01-31 ENCOUNTER — Encounter: Payer: Self-pay | Admitting: Internal Medicine

## 2019-02-05 ENCOUNTER — Other Ambulatory Visit: Payer: Self-pay | Admitting: Family Medicine

## 2019-02-16 ENCOUNTER — Other Ambulatory Visit: Payer: Self-pay

## 2019-02-16 ENCOUNTER — Telehealth: Payer: Self-pay | Admitting: Family Medicine

## 2019-02-16 MED ORDER — ALBUTEROL SULFATE (2.5 MG/3ML) 0.083% IN NEBU: 2.5000 mg | INHALATION_SOLUTION | Freq: Four times a day (QID) | RESPIRATORY_TRACT | 3 refills | Status: DC | PRN

## 2019-02-16 NOTE — Telephone Encounter (Signed)
Copied from [place] [phone]. Topic: Quick Communication - Rx Refill/Question >> Feb 16, 2019 12:31 PM Bea Graff, NT wrote: Medication: albuterol (PROVENTIL) (2.5 MG/3ML) 0.083% nebulizer solution  Pt is out completely and would like to see if this can be sent in so he doesn't end up in the hospital.   Has the patient contacted their pharmacy? Yes.   (Agent: If no, request that the patient contact the pharmacy for the refill.) (Agent: If yes, when and what did the pharmacy advise?)  Preferred Pharmacy (with phone number or street name): [id number] [place], [place] - [address]. AT [address] [phone] (Phone) [phone] (Fax)    Agent: Please be advised that RX refills may take up to 3 business days. We ask that you follow-up with your pharmacy.

## 2019-02-16 NOTE — Telephone Encounter (Signed)
Unable to leave pt VM, need to inform about rx being sent

## 2019-03-01 ENCOUNTER — Telehealth: Payer: Self-pay

## 2019-03-01 ENCOUNTER — Other Ambulatory Visit: Payer: Self-pay

## 2019-03-01 DIAGNOSIS — J4540 Moderate persistent asthma, uncomplicated: Secondary | ICD-10-CM

## 2019-03-01 MED ORDER — ALBUTEROL SULFATE HFA 108 (90 BASE) MCG/ACT IN AERS: 2.0000 | INHALATION_SPRAY | Freq: Four times a day (QID) | RESPIRATORY_TRACT | 1 refills | Status: DC | PRN

## 2019-03-01 NOTE — Telephone Encounter (Signed)
Albuterol refilled with 90 day supply. Copied from [place] [phone]. Topic: General - Other >> Mar 01, 2019  8:32 AM Carolyn Stare wrote:  Pt said he need the below med to be 90 days supply , he said anything under 90 days does not benefit him , he was very upset   albuterol (PROVENTIL HFA;VENTOLIN HFA) 108 (90 Base) MCG/ACT inhaler

## 2019-03-15 MED ORDER — ALBUTEROL SULFATE (2.5 MG/3ML) 0.083% IN NEBU: 2.5000 mg | INHALATION_SOLUTION | Freq: Four times a day (QID) | RESPIRATORY_TRACT | 3 refills | Status: DC | PRN
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
## 2019-03-15 NOTE — Telephone Encounter (Signed)
New rx sent

## 2019-04-21 ENCOUNTER — Telehealth: Payer: Self-pay | Admitting: Family Medicine

## 2019-04-21 NOTE — Telephone Encounter (Signed)
Yes please set pt up for doxy.me appt for this patient as refill of steroid is not appropriate without a visit

## 2019-04-21 NOTE — Telephone Encounter (Signed)
Copied from [place] [phone]. Topic: Quick Communication - Rx Refill/Question >> Apr 21, 2019 12:05 PM Richardo Priest, NT wrote: Medication:  predniSONE (DELTASONE) tablet 60 mg  Has the patient contacted their pharmacy? Yes patient states pharmacy has been trying to fill prescription for past 2 weeks. States he has been having difficulty breathing for this amount of time.   Preferred Pharmacy (with phone number or street name):  [id number] [place], [place] - [address]. AT [address] [place] [phone] (Phone) [phone] (Fax)  Agent: Please be advised that RX refills may take up to 3 business days. We ask that you follow-up with your pharmacy.

## 2019-04-21 NOTE — Telephone Encounter (Signed)
Dr. Loletha Grayer please advise do I need to set up doxy appt?

## 2019-04-21 NOTE — Telephone Encounter (Signed)
Left Pt VM to call back, Need to make pt a e-visit to fill this prescription. Can schedule pt with another provider on Monday if pt able to do an e-visit.

## 2019-04-26 ENCOUNTER — Ambulatory Visit (INDEPENDENT_AMBULATORY_CARE_PROVIDER_SITE_OTHER): Payer: Medicare Other | Admitting: Family Medicine

## 2019-04-26 ENCOUNTER — Encounter: Payer: Self-pay | Admitting: Family Medicine

## 2019-04-26 VITALS — BP 150/90

## 2019-04-26 DIAGNOSIS — Z9119 Patient's noncompliance with other medical treatment and regimen: Secondary | ICD-10-CM

## 2019-04-26 DIAGNOSIS — I10 Essential (primary) hypertension: Secondary | ICD-10-CM | POA: Diagnosis not present

## 2019-04-26 DIAGNOSIS — Z91199 Patient's noncompliance with other medical treatment and regimen due to unspecified reason: Secondary | ICD-10-CM | POA: Insufficient documentation

## 2019-04-26 DIAGNOSIS — E118 Type 2 diabetes mellitus with unspecified complications: Secondary | ICD-10-CM

## 2019-04-26 DIAGNOSIS — J4541 Moderate persistent asthma with (acute) exacerbation: Secondary | ICD-10-CM | POA: Diagnosis not present

## 2019-04-26 MED ORDER — PREDNISONE 20 MG PO TABS: ORAL_TABLET | ORAL | 0 refills | Status: DC

## 2019-04-26 MED ORDER — AMLODIPINE BESYLATE 10 MG PO TABS: 10.0000 mg | ORAL_TABLET | Freq: Every day | ORAL | 3 refills | Status: AC

## 2019-04-26 MED ORDER — AMOXICILLIN-POT CLAVULANATE 875-125 MG PO TABS: 1.0000 | ORAL_TABLET | Freq: Two times a day (BID) | ORAL | 0 refills | Status: AC

## 2019-04-26 MED ORDER — FLUTICASONE PROPIONATE 50 MCG/ACT NA SUSP: 2.0000 | Freq: Every day | NASAL | 5 refills | Status: DC

## 2019-04-26 MED ORDER — IPRATROPIUM-ALBUTEROL 0.5-2.5 (3) MG/3ML IN SOLN: 3.0000 mL | Freq: Four times a day (QID) | RESPIRATORY_TRACT | 5 refills | Status: DC | PRN

## 2019-04-26 MED ORDER — VALACYCLOVIR HCL 1 G PO TABS: 1000.0000 mg | ORAL_TABLET | Freq: Every day | ORAL | 3 refills | Status: AC

## 2019-04-26 MED ORDER — SITAGLIPTIN PHOSPHATE 50 MG PO TABS: 50.0000 mg | ORAL_TABLET | Freq: Every day | ORAL | 3 refills | Status: DC

## 2019-04-26 NOTE — Telephone Encounter (Signed)
Pt made e-visit 4/29

## 2019-04-26 NOTE — Progress Notes (Signed)
Virtual Visit via Video Note  I connected with Gary Buchanan on 04/26/19 at 11:00 AM EDT by a video enabled telemedicine application and verified that I am speaking with the correct person using two identifiers. Location patient: home Location provider: home office Persons participating in the virtual visit: patient, provider  I discussed the limitations of evaluation and management by telemedicine and the availability of in person appointments. The patient expressed understanding and agreed to proceed.  Interactive audio and video telecommunications were attempted between myself and the patient, however failed, due to the patient having technical difficulties OR the patient did not have access to video capability. We continued and completed the visit with audio only.   Chief Complaint  Patient presents with  . Shortness of Breath    cough--mucous thick white/ drainage/ SOB/stuffy/ when walks heart racing/OTC vicks daytime cold allergy pill/  . Medication Refill    amlodipine, valtrex, Celesta Gentile     HPI: Gary Buchanan is a 53 y.o. male complains of 3 week h/o cough with productive mucous, PND, runny nose, nasal congestion, SOB. He feels his "sinuses are congested and blocked". No fever, chills. No ear pains. No sore throat.  He has been taking zyrtec daily and is using flonase daily (sometimes 2-3x/day). He is taking flovent BID, using albuterol nebs every 4-6 hrs, and albuterol inhaler PRN.  Pt is not taking singulair due to cost. He feels duonebs worked better in the past compared to the albuterol nebs he is currently using.   Pt also needs refills of his januvia, amlodipine, and valtrex.   He is very overdue for labs including A1C, lipids, BMP. He is due for HTN f/u as well. He does not have a home BP cuff.     Past Medical History:  Diagnosis Date  . A-fib ([place])   . Asthma   . Blindness of left eye   . Chronic kidney disease   . Diabetes mellitus without complication ([place])   .  Gynecomastia   . Hypertension   . Left knee pain     Past Surgical History:  Procedure Laterality Date  . ADRENALECTOMY    . EYE SURGERY    . HERNIA REPAIR    . SINUS ENDO WITH FUSION  10/17/2012   Procedure: SINUS ENDO WITH FUSION;  Surgeon: Ascencion Dike, MD;  Location: [place];  Service: ENT;  Laterality: Bilateral;  Bilateral Ethmoidectomy,  Bilateral Maxillary Antrostomy, Bilateral Frontal Recess Exploration,   W/ Fusion Protocol    Family History  Problem Relation Age of Onset  . Diabetes Mother   . Hypertension Mother   . Hyperlipidemia Mother   . Diabetes Father   . Hypertension Father   . Diabetes Sister   . Hypertension Sister   . Diabetes Brother   . Hypertension Brother     Social History   Tobacco Use  . Smoking status: Never Smoker  . Smokeless tobacco: Never Used  Substance Use Topics  . Alcohol use: No  . Drug use: No     Current Outpatient Medications:  .  albuterol (PROVENTIL HFA;VENTOLIN HFA) 108 (90 Base) MCG/ACT inhaler, Inhale 2 puffs into the lungs every 6 (six) hours as needed., Disp: 3 Inhaler, Rfl: 1 .  allopurinol (ZYLOPRIM) 300 MG tablet, Take 1 tablet (300 mg total) by mouth daily., Disp: 90 tablet, Rfl: 3 .  amLODipine (NORVASC) 10 MG tablet, Take 1 tablet (10 mg total) by mouth daily., Disp: 90 tablet, Rfl: 3 .  aspirin  EC 81 MG tablet, Take 81 mg by mouth daily., Disp: , Rfl:  .  Brinzolamide-Brimonidine 1-0.2 % SUSP, Place 1 drop into the right eye 3 (three) times daily., Disp: , Rfl:  .  cetirizine (ZYRTEC) 10 MG tablet, Take 1 tablet (10 mg total) by mouth daily., Disp: 90 tablet, Rfl: 3 .  ferrous sulfate 325 (65 FE) MG tablet, Take 325 mg by mouth daily with breakfast., Disp: , Rfl:  .  fluticasone (FLONASE) 50 MCG/ACT nasal spray, Place 2 sprays into both nostrils daily., Disp: 16 g, Rfl: 5 .  fluticasone (FLOVENT HFA) 110 MCG/ACT inhaler, Inhale 2 puffs into the lungs 2 (two) times daily., Disp: 1 Inhaler, Rfl: 12 .   folic acid (FOLVITE) 244 MCG tablet, Take 400 mcg by mouth daily., Disp: , Rfl:  .  furosemide (LASIX) 20 MG tablet, TAKE 1 TABLET BY MOUTH EVERY MORNING FOR BLOOD PRESSURE AND FLUID RETENTION., Disp: 90 tablet, Rfl: 3 .  ipratropium-albuterol (DUONEB) 0.5-2.5 (3) MG/3ML SOLN, Take 3 mLs by nebulization every 6 (six) hours as needed., Disp: 360 mL, Rfl: 5 .  magnesium gluconate (MAGONATE) 500 MG tablet, Take 1 tablet (500 mg total) by mouth daily., Disp: 90 tablet, Rfl: 3 .  Multiple Vitamins-Minerals (SENTRY SENIOR PO), Take 1 tablet by mouth daily., Disp: , Rfl:  .  pyridOXINE (VITAMIN B-6) 100 MG tablet, Take 100 mg by mouth daily., Disp: , Rfl:  .  sitaGLIPtin (JANUVIA) 50 MG tablet, Take 1 tablet (50 mg total) by mouth daily., Disp: 90 tablet, Rfl: 3 .  timolol (BETIMOL) 0.25 % ophthalmic solution, Place 1-2 drops into the right eye 2 (two) times daily., Disp: , Rfl:  .  travoprost, benzalkonium, (TRAVATAN) 0.004 % ophthalmic solution, Place 1 drop into the right eye at bedtime., Disp: , Rfl:  .  valACYclovir (VALTREX) 1000 MG tablet, Take 1 tablet (1,000 mg total) by mouth daily., Disp: 90 tablet, Rfl: 3 .  vitamin E (VITAMIN E) 200 UNIT capsule, Take 200 Units by mouth daily., Disp: , Rfl:  .  Blood Glucose Monitoring Suppl (ACCU-CHEK AVIVA PLUS) w/Device KIT, USE DAILY AS DIRECTED (Patient not taking: Reported on 12/09/2018), Disp: 1 kit, Rfl: 0  Allergies  Allergen Reactions  . Latex Itching  . Lisinopril Swelling    Other reaction(s): Other Lip swelling  . Ace Inhibitors Swelling    Lips swells  . Gabapentin Other (See Comments)    Other reaction(s): Other (See Comments) Rapid heartbeat  Other reaction(s): Other Rapid heartbeat  Other reaction(s): Other (See Comments) Rapid heartbeat  Rapid heartbeat   . Tizanidine Other (See Comments)    Other reaction(s): Other chest pain  Other reaction(s): Other (See Comments) Chest pain Other reaction(s): Other (See Comments) Chest  pain chest pain       ROS: See pertinent positives and negatives per HPI.   EXAM:  VITALS per patient if applicable:  GENERAL: alert, oriented, appears well and in no acute distress  LUNGS: no sudible gasping or wheezing, mild conversational dyspnea  PSYCH/NEURO: pleasant and cooperative   ASSESSMENT AND PLAN: 1. Type 2 diabetes mellitus with complication, without long-term current use of insulin (HCC) - pt is very overdue for f/u and labs - will schedule lab and then f/u virtual appt Refill: - sitaGLIPtin (JANUVIA) 50 MG tablet; Take 1 tablet (50 mg total) by mouth daily.  Dispense: 90 tablet; Refill: 3  2. Essential hypertension - pt is overdue for f/u - will check pts BP when he comes  to office for lab appt Refill: - amLODipine (NORVASC) 10 MG tablet; Take 1 tablet (10 mg total) by mouth daily.  Dispense: 90 tablet; Refill: 3  3. Moderate persistent asthma with exacerbation - cont with supportive care Rx: - predniSONE (DELTASONE) 20 MG tablet; 3 tabs po x 3 days, 2 tabs po x 3 days, 1 tab po x 3 days, 1/2 tab po x 3 days  Dispense: 20 tablet; Refill: 0 - amoxicillin-clavulanate (AUGMENTIN) 875-125 MG tablet; Take 1 tablet by mouth 2 (two) times daily for 7 days.  Dispense: 14 tablet; Refill: 0 - ipratropium-albuterol (DUONEB) 0.5-2.5 (3) MG/3ML SOLN; Take 3 mLs by nebulization every 6 (six) hours as needed.  Dispense: 360 mL; Refill: 5 - cont with albuterol inhaler PRN - f/u in about 2 wks or sooner PRN  4. Non-compliance    I discussed the assessment and treatment plan with the patient. The patient was provided an opportunity to ask questions and all were answered. The patient agreed with the plan and demonstrated an understanding of the instructions.   The patient was advised to call back or seek an in-person evaluation if the symptoms worsen or if the condition fails to improve as anticipated.   Letta Median, DO

## 2019-04-28 ENCOUNTER — Telehealth: Payer: Self-pay

## 2019-04-28 DIAGNOSIS — J3489 Other specified disorders of nose and nasal sinuses: Secondary | ICD-10-CM

## 2019-04-28 DIAGNOSIS — I10 Essential (primary) hypertension: Secondary | ICD-10-CM

## 2019-04-28 DIAGNOSIS — R0981 Nasal congestion: Secondary | ICD-10-CM

## 2019-04-28 MED ORDER — LOSARTAN POTASSIUM 50 MG PO TABS: 50.0000 mg | ORAL_TABLET | Freq: Every day | ORAL | 3 refills | Status: DC

## 2019-04-28 NOTE — Telephone Encounter (Signed)
Copied from [place] [id number]. Topic: Referral - Request for Referral >> Apr 28, 2019  9:17 AM Scherrie Gerlach wrote: Pt would like referral to an ENT within the [facility]. Pt would like a male dr also. Pt states he is just not better, having headaches all the time. Head stopped up 3 weeks

## 2019-04-28 NOTE — Telephone Encounter (Signed)
Lmovm, crm created.

## 2019-04-28 NOTE — Telephone Encounter (Signed)
Please ensure pt is taking zyrtec daily (he could try changing to allegra or claritin if he has been taking zyrtec for a while), using flonase 2 sprays each nostril daily, using nasal saline spray at least 3x/day and now taking the prednisone taper as prescribed on Wed 4/29?   I would also like pt to consider adding a BP med to his current regimen of norvasc 10mg  daily. Uncontrolled HTN can cause headaches. If he is agreeable, I will send Rx for losartan 50mg  daily to his pharm and would like him to check BP 3-4x/wk and make f/u virtual visit for 2-3 wks from when he starts the med. BP Readings from Last 3 Encounters:  04/26/19 (!) 150/90  12/09/18 (!) 132/110  03/17/18 102/64   I will place ENT referral as pt has requested.

## 2019-04-28 NOTE — Addendum Note (Signed)
Addended by: Ronnald Nian on: 04/28/2019 10:04 AM   Modules accepted: Orders

## 2019-05-02 NOTE — Telephone Encounter (Signed)
Pt verbalized understanding, he did say that he was not taking zyrtec. Pt said that he will pick up the new BP medication and start this and keep track of his BP and make new appointment in 2-3 weeks.  Pt has appointment tomorrow told him we would discuss zyrtec and pt states he can breath better.

## 2019-05-03 ENCOUNTER — Other Ambulatory Visit: Payer: Medicaid Other

## 2019-05-03 ENCOUNTER — Ambulatory Visit (INDEPENDENT_AMBULATORY_CARE_PROVIDER_SITE_OTHER): Payer: Medicare Other | Admitting: Family Medicine

## 2019-05-03 ENCOUNTER — Other Ambulatory Visit: Payer: Self-pay | Admitting: Family Medicine

## 2019-05-03 ENCOUNTER — Encounter: Payer: Self-pay | Admitting: Family Medicine

## 2019-05-03 DIAGNOSIS — J4541 Moderate persistent asthma with (acute) exacerbation: Secondary | ICD-10-CM

## 2019-05-03 DIAGNOSIS — I10 Essential (primary) hypertension: Secondary | ICD-10-CM

## 2019-05-03 DIAGNOSIS — E118 Type 2 diabetes mellitus with unspecified complications: Secondary | ICD-10-CM | POA: Diagnosis not present

## 2019-05-03 DIAGNOSIS — Z1159 Encounter for screening for other viral diseases: Secondary | ICD-10-CM

## 2019-05-03 LAB — BASIC METABOLIC PANEL
BUN: 31 mg/dL — ABNORMAL HIGH (ref 6–23)
CO2: 23 mEq/L (ref 19–32)
Calcium: 9.6 mg/dL (ref 8.4–10.5)
Chloride: 106 mEq/L (ref 96–112)
Creatinine, Ser: 1.54 mg/dL — ABNORMAL HIGH (ref 0.40–1.50)
GFR: 57.03 mL/min — ABNORMAL LOW (ref >60.00)
Glucose, Bld: 152 mg/dL — ABNORMAL HIGH (ref 70–99)
Potassium: 4.1 mEq/L (ref 3.5–5.1)
Sodium: 137 mEq/L (ref 135–145)

## 2019-05-03 LAB — CBC
HCT: 37.9 % — ABNORMAL LOW (ref 39.0–52.0)
Hemoglobin: 12.6 g/dL — ABNORMAL LOW (ref 13.0–17.0)
MCHC: 33.3 g/dL (ref 30.0–36.0)
MCV: 84 fl (ref 78.0–100.0)
Platelets: 327 10*3/uL (ref 150.0–400.0)
RBC: 4.52 Mil/uL (ref 4.22–5.81)
RDW: 16.3 % — ABNORMAL HIGH (ref 11.5–15.5)
WBC: 12.4 10*3/uL — ABNORMAL HIGH (ref 4.0–10.5)

## 2019-05-03 LAB — LIPID PANEL
Cholesterol: 157 mg/dL (ref 0–200)
HDL: 39.1 mg/dL (ref >39.00)
LDL Cholesterol: 80 mg/dL (ref 0–99)
NonHDL: 117.89
Total CHOL/HDL Ratio: 4
Triglycerides: 187 mg/dL — ABNORMAL HIGH (ref 0.0–149.0)
VLDL: 37.4 mg/dL (ref 0.0–40.0)

## 2019-05-03 LAB — AST: AST: 23 U/L (ref 0–37)

## 2019-05-03 LAB — MICROALBUMIN / CREATININE URINE RATIO
Creatinine,U: 99.4 mg/dL
Microalb Creat Ratio: 93 mg/g — ABNORMAL HIGH (ref 0.0–30.0)
Microalb, Ur: 92.4 mg/dL — ABNORMAL HIGH (ref 0.0–1.9)

## 2019-05-03 LAB — ALT: ALT: 28 U/L (ref 0–53)

## 2019-05-03 LAB — HEMOGLOBIN A1C: Hgb A1c MFr Bld: 6.5 % (ref 4.6–6.5)

## 2019-05-03 MED ORDER — LORATADINE 10 MG PO TABS: 10.0000 mg | ORAL_TABLET | Freq: Every day | ORAL | 3 refills | Status: DC

## 2019-05-03 NOTE — Progress Notes (Addendum)
Virtual Visit via Video Note  I connected with Gary Buchanan on 05/03/19 at 11:30 AM EDT by a video enabled telemedicine application and verified that I am speaking with the correct person using two identifiers. Location patient: home Location provider:  home office Persons participating in the virtual visit: patient, provider  I discussed the limitations of evaluation and management by telemedicine and the availability of in person appointments. The patient expressed understanding and agreed to proceed.  Interactive audio and video telecommunications were attempted between myself and the patient, however failed, due to the patient did not have access to video capability. We continued and completed the visit with audio only.   Chief Complaint  Patient presents with  . Follow-up    Pt states breathing is easier/ hasn't been taking the zyrtec     HPI: Gary Buchanan is a 53 y.o. male to f/u on his acute asthma exacerbation and seasonal allergies. At last OV 1 week ago, I Rx'd pt a prednisone taper, augmentin, and duoneb (to replace albuterol nebs that pt did not feel was working as well as duonebs in the past). Today, he states his breathing is much better and he feels better overall. He is not using/neding his albuterol inhaler and is using the duonebs less frequently.  Pt states he is not sure if he is taking zyrtec. He had a Rx but thinks he poured the pills into another Rx bottle and now isn't sure which bottle that was. He is using flonase daily. He had Rx for singulair but states his insurance didn't cover this and it was therefore too expensive.  He has not yet picked up the losartan 86m Rx that I sent in on Friday. He plans to go to the pharm for this and other meds in 2 days. He is due for labs and had these drawn at the office earlier this AM.  Past Medical History:  Diagnosis Date  . [place]   . Asthma   . Blindness of left eye   . Chronic kidney disease   . Diabetes  mellitus without complication ([place]   . Gynecomastia   . Hypertension   . Left knee pain     Past Surgical History:  Procedure Laterality Date  . ADRENALECTOMY    . EYE SURGERY    . HERNIA REPAIR    . SINUS ENDO WITH FUSION  10/17/2012   Procedure: SINUS ENDO WITH FUSION;  Surgeon: SAscencion Dike MD;  [facility]  Service: ENT;  Laterality: Bilateral;  Bilateral Ethmoidectomy,  Bilateral Maxillary Antrostomy, Bilateral Frontal Recess Exploration,   W/ Fusion Protocol    Family History  Problem Relation Age of Onset  . Diabetes Mother   . Hypertension Mother   . Hyperlipidemia Mother   . Diabetes Father   . Hypertension Father   . Diabetes Sister   . Hypertension Sister   . Diabetes Brother   . Hypertension Brother     Social History   Tobacco Use  . Smoking status: Never Smoker  . Smokeless tobacco: Never Used  Substance Use Topics  . Alcohol use: No  . Drug use: No     Current Outpatient Medications:  .  albuterol (PROVENTIL HFA;VENTOLIN HFA) 108 (90 Base) MCG/ACT inhaler, Inhale 2 puffs into the lungs every 6 (six) hours as needed., Disp: 3 Inhaler, Rfl: 1 .  allopurinol (ZYLOPRIM) 300 MG tablet, Take 1 tablet (300 mg total) by mouth daily., Disp: 90 tablet, Rfl: 3 .  amLODipine (NORVASC) 10 MG tablet, Take 1 tablet (10 mg total) by mouth daily., Disp: 90 tablet, Rfl: 3 .  amoxicillin-clavulanate (AUGMENTIN) 875-125 MG tablet, Take 1 tablet by mouth 2 (two) times daily for 7 days., Disp: 14 tablet, Rfl: 0 .  aspirin EC 81 MG tablet, Take 81 mg by mouth daily., Disp: , Rfl:  .  Blood Glucose Monitoring Suppl (ACCU-CHEK AVIVA PLUS) w/Device KIT, USE DAILY AS DIRECTED, Disp: 1 kit, Rfl: 0 .  Brinzolamide-Brimonidine 1-0.2 % SUSP, Place 1 drop into the right eye 3 (three) times daily., Disp: , Rfl:  .  ferrous sulfate 325 (65 FE) MG tablet, Take 325 mg by mouth daily with breakfast., Disp: , Rfl:  .  fluticasone (FLONASE) 50 MCG/ACT nasal spray,  Place 2 sprays into both nostrils daily., Disp: 16 g, Rfl: 5 .  fluticasone (FLOVENT HFA) 110 MCG/ACT inhaler, Inhale 2 puffs into the lungs 2 (two) times daily., Disp: 1 Inhaler, Rfl: 12 .  folic acid (FOLVITE) 891 MCG tablet, Take 400 mcg by mouth daily., Disp: , Rfl:  .  furosemide (LASIX) 20 MG tablet, TAKE 1 TABLET BY MOUTH EVERY MORNING FOR BLOOD PRESSURE AND FLUID RETENTION., Disp: 90 tablet, Rfl: 3 .  ipratropium-albuterol (DUONEB) 0.5-2.5 (3) MG/3ML SOLN, Take 3 mLs by nebulization every 6 (six) hours as needed., Disp: 360 mL, Rfl: 5 .  losartan (COZAAR) 50 MG tablet, Take 1 tablet (50 mg total) by mouth daily., Disp: 90 tablet, Rfl: 3 .  magnesium gluconate (MAGONATE) 500 MG tablet, Take 1 tablet (500 mg total) by mouth daily., Disp: 90 tablet, Rfl: 3 .  Multiple Vitamins-Minerals (SENTRY SENIOR PO), Take 1 tablet by mouth daily., Disp: , Rfl:  .  predniSONE (DELTASONE) 20 MG tablet, 3 tabs po x 3 days, 2 tabs po x 3 days, 1 tab po x 3 days, 1/2 tab po x 3 days, Disp: 20 tablet, Rfl: 0 .  pyridOXINE (VITAMIN B-6) 100 MG tablet, Take 100 mg by mouth daily., Disp: , Rfl:  .  sitaGLIPtin (JANUVIA) 50 MG tablet, Take 1 tablet (50 mg total) by mouth daily., Disp: 90 tablet, Rfl: 3 .  timolol (BETIMOL) 0.25 % ophthalmic solution, Place 1-2 drops into the right eye 2 (two) times daily., Disp: , Rfl:  .  travoprost, benzalkonium, (TRAVATAN) 0.004 % ophthalmic solution, Place 1 drop into the right eye at bedtime., Disp: , Rfl:  .  valACYclovir (VALTREX) 1000 MG tablet, Take 1 tablet (1,000 mg total) by mouth daily., Disp: 90 tablet, Rfl: 3 .  vitamin E (VITAMIN E) 200 UNIT capsule, Take 200 Units by mouth daily., Disp: , Rfl:  .  cetirizine (ZYRTEC) 10 MG tablet, Take 1 tablet (10 mg total) by mouth daily. (Patient not taking: Reported on 05/03/2019), Disp: 90 tablet, Rfl: 3  Allergies  Allergen Reactions  . Latex Itching  . Lisinopril Swelling    Other reaction(s): Other Lip swelling  . Ace  Inhibitors Swelling    Lips swells  . Gabapentin Other (See Comments)    Other reaction(s): Other (See Comments) Rapid heartbeat  Other reaction(s): Other Rapid heartbeat  Other reaction(s): Other (See Comments) Rapid heartbeat  Rapid heartbeat   . Tizanidine Other (See Comments)    Other reaction(s): Other chest pain  Other reaction(s): Other (See Comments) Chest pain Other reaction(s): Other (See Comments) Chest pain chest pain       ROS: See pertinent positives and negatives per HPI.   EXAM:  VITALS per patient if applicable:  GENERAL: alert, oriented, no audible distress  LUNGS: no gasping or wheezing, no conversational dyspnea  PSYCH/NEURO: pleasant and cooperative, speech is mumbled at baseline but thought processing grossly intact   ASSESSMENT AND PLAN:  1. Type 2 diabetes mellitus with complication, without long-term current use of insulin (HCC) - on januvia 47m daily and overdue for A1C and other labs - cont ASA - pt is not on statin and has not picked up Rx yet for ARB (losartan) - Lipid panel - Microalbumin / creatinine urine ratio - Hemoglobin A1c - CBC - Basic metabolic panel - AST - ALT - will contact pt to review labs and make any necessary changes to regimen once results available - pt needs q392mo/u   2. Need for hepatitis C screening test - Hepatitis C antibody  3. Essential hypertension - not at goal at time of last BP reading - cont amlodipine 1039maily and start losartan 30m24mily (pt states he will pick up Rx in 2 days) - Basic metabolic panel - in office f/u appt in 1 mo for Bp check  4. Moderate persistent asthma with exacerbation - exacerbation resolved, pt completing prednisone taper and abx - cont with daily flovent, PRN duoneb and albuterol inhaler - pt will talk to pharmacist and call insurance company to ask about coverage for singular for which he has Rx that he has not filled in 18mo 36moll switch zyrtec to claritin  and pt will take Rx bottle to pharm in 2 days so they can look at pills and confirm what he is taking    I discussed the assessment and treatment plan with the patient. The patient was provided an opportunity to ask questions and all were answered. The patient agreed with the plan and demonstrated an understanding of the instructions.   The patient was advised to call back or seek an in-person evaluation if the symptoms worsen or if the condition fails to improve as anticipated.  I personally spent 30 min in direct care of the patient today and greater than 50% was spent in counseling, coordination of care, education   Mary Letta Median

## 2019-05-04 LAB — HEPATITIS C ANTIBODY
Hepatitis C Ab: NONREACTIVE
SIGNAL TO CUT-OFF: 0.01 (ref <1.00)

## 2019-05-05 ENCOUNTER — Other Ambulatory Visit: Payer: Self-pay

## 2019-05-05 ENCOUNTER — Other Ambulatory Visit: Payer: Self-pay | Admitting: Family Medicine

## 2019-05-05 DIAGNOSIS — I10 Essential (primary) hypertension: Secondary | ICD-10-CM

## 2019-05-05 MED ORDER — LOSARTAN POTASSIUM 50 MG PO TABS: 50.0000 mg | ORAL_TABLET | Freq: Every day | ORAL | 3 refills | Status: DC

## 2019-05-05 NOTE — Telephone Encounter (Signed)
Copied from [place] [id number]. Topic: Quick Communication - Rx Refill/Question >> May 05, 2019  2:10 PM Izola Price, [place] A wrote: Medication: losartan (COZAAR) 50 MG tablet (Contacted patients pharmacy and pharmacy stated that there was no record of refill request for this medication.)  Has the patient contacted their pharmacy? Yes (Agent: If no, request that the patient contact the pharmacy for the refill.) (Agent: If yes, when and what did the pharmacy advise?)Contact PCP  Preferred Pharmacy (with phone number or street name): [id number] [place], [place] - [address] [phone] (Phone) [phone] (Fax)    Agent: Please be advised that RX refills may take up to 3 business days. We ask that you follow-up with your pharmacy.

## 2019-05-05 NOTE — Telephone Encounter (Signed)
Sent the losartan on 5/1 spoke with pharmacy pharmacy never received the losartan resent to pharmacy.

## 2019-05-09 ENCOUNTER — Other Ambulatory Visit: Payer: Self-pay | Admitting: Family Medicine

## 2019-05-15 ENCOUNTER — Ambulatory Visit: Payer: Self-pay | Admitting: *Deleted

## 2019-05-15 NOTE — Telephone Encounter (Signed)
Contacted by [place] stating that the pt is having trouble breathing due to asthma; pt disconnected before call could be transferred; will attempt to contact pt.

## 2019-05-15 NOTE — Telephone Encounter (Signed)
Contacted pt regarding his symptoms; he states that his Mosetta Putt is acting up; he says that for the past day or so he can not walk without his heart racing, and he can not breath without his nebulizer (> 10 times in 24 hours); the pt says that he has been using his flovent inhaler every 6 hours; nurse triage initiated and recommendations made per protocol; he verbalizes understanding, and will have his sister take him to the ED; the pt normally sees Dr Bryan Lemma, Audrie Lia; will route to office for notification of this encounter.    Reason for Disposition . [1] MODERATE difficulty breathing (e.g., speaks in phrases, SOB even at rest, pulse 100-120) AND [2] NEW-onset or WORSE than normal  Answer Assessment - Initial Assessment Questions 1. RESPIRATORY STATUS: "Describe your breathing?" (e.g., wheezing, shortness of breath, unable to speak, severe coughing)      Shortness of breath 2. ONSET: "When did this breathing problem begin?"      05/14/2019 3. PATTERN "Does the difficult breathing come and go, or has it been constant since it started?"      constant 4. SEVERITY: "How bad is your breathing?" (e.g., mild, moderate, severe)    - MILD: No SOB at rest, mild SOB with walking, speaks normally in sentences, can lay down, no retractions, pulse < 100.    - MODERATE: SOB at rest, SOB with minimal exertion and prefers to sit, cannot lie down flat, speaks in phrases, mild retractions, audible wheezing, pulse 100-120.    - SEVERE: Very SOB at rest, speaks in single words, struggling to breathe, sitting hunched forward, retractions, pulse > 120     severe 5. RECURRENT SYMPTOM: "Have you had difficulty breathing before?" If so, ask: "When was the last time?" and "What happened that time?"      Yes, a lot, has been hospitalized 6. CARDIAC HISTORY: "Do you have any history of heart disease?" (e.g., heart attack, angina, bypass surgery, angioplasty)     High blood pressure 7. LUNG HISTORY: "Do you have any  history of lung disease?"  (e.g., pulmonary embolus, asthma, emphysema)    Asthma, bronchitis 8. CAUSE: "What do you think is causing the breathing problem?"      Needs more medicine 9. OTHER SYMPTOMS: "Do you have any other symptoms? (e.g., dizziness, runny nose, cough, chest pain, fever)  no 10. PREGNANCY: "Is there any chance you are pregnant?" "When was your last menstrual period?"       n/a 11. TRAVEL: "Have you traveled out of the country in the last month?" (e.g., travel history, exposures)       No, no  Protocols used: BREATHING DIFFICULTY-A-AH

## 2019-05-15 NOTE — Telephone Encounter (Signed)
Dr. Dierdre Highman.

## 2019-05-19 ENCOUNTER — Telehealth: Payer: Self-pay | Admitting: Family Medicine

## 2019-05-19 DIAGNOSIS — M79643 Pain in unspecified hand: Secondary | ICD-10-CM

## 2019-05-19 MED ORDER — DICLOFENAC SODIUM 1 % TD GEL: 2.0000 g | Freq: Four times a day (QID) | TRANSDERMAL | 3 refills | Status: DC

## 2019-05-19 NOTE — Telephone Encounter (Signed)
Dr.C please advise

## 2019-05-19 NOTE — Telephone Encounter (Signed)
Copied from [place] [id number]. Topic: General - Other >> May 19, 2019 11:07 AM Pauline Good wrote: Reason for CRM: pt stated his hands is hurting so bad and he need some medicine for the pain. Pt stated he talked to the doc during his televisit about his pain. Pt also stated the inhaler he received in the mail from the pharmacy made his worse and he can't use it. Please call pt to advise.

## 2019-05-19 NOTE — Telephone Encounter (Signed)
I did not Rx a new/different inhaler, pt has been on flovent (2 puffs BID) and PRN albuterol since at least fall of 2019 . At virtual visit with me on 4/29, pt had asked me to switch his neb solution from albuterol to duoneb which I did. Is he referring to Duoneb solution? Regarding hand pain, he can try extra strength tylenol 500mg  2 tabs q4-6hrs as needed. I can send Rx for voltaren gel for pt to use on hand 4x/day.

## 2019-05-19 NOTE — Telephone Encounter (Signed)
Pt verbalized understanding of the new rx sent in he will get from the pharmacy. Pt said that the inhaler that was given was different the pharmacy told him it was still pro-air but he stopped using it because it wasn't doing him right.

## 2019-05-19 NOTE — Telephone Encounter (Signed)
Left pt VM to call back, need to discuss with pt in more details of below notes.

## 2019-05-19 NOTE — Telephone Encounter (Signed)
Flovent was last sent in to pharm in 11/2018 and albuterol inhaler (as needed/rescue inhaler) was last sent 03/01/2019. Albuterol is the generic med name and brands can be proventil, ventolin). I have not sent any Rx for inhalers recently. He may need to take his inhalers to the pharmacy and discuss/confirm with staff there that he has when is Rx'd.

## 2019-05-23 ENCOUNTER — Telehealth: Payer: Self-pay | Admitting: Internal Medicine

## 2019-05-23 NOTE — Telephone Encounter (Signed)
Called pt and left message informing pt that he needed to call his pharmacy to request a refill and if he did not have any refills left, he could give [place] a call back informing us of the name of the medication that need to be refilled.
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
## 2019-05-24 NOTE — Telephone Encounter (Signed)
Left pt another VM to return my call to inform notes below.

## 2019-05-26 ENCOUNTER — Telehealth: Payer: Self-pay | Admitting: Internal Medicine

## 2019-05-26 NOTE — Telephone Encounter (Signed)
New Message     Pt is needing his medication refilled and he doesn't know the name of the medication. He says he is out of the medication and his pharmacy does not know what medication he is referring too.     Please call

## 2019-05-26 NOTE — Telephone Encounter (Signed)
New Message     Pt is calling and is needing to refill a medication and he doesn't know the name of it   Please call

## 2019-05-26 NOTE — Telephone Encounter (Signed)
Called pt to inform him that his PCP has refilled all his heart medications and sent them to his pharmacy and that he needed to give his pharmacy a call and if he has any other problems, questions or concerns to give our office a call back. Pt verbalized understanding.

## 2019-05-26 NOTE — Telephone Encounter (Signed)
Left message for pt to call back and leave the name of the medication that he needs refilled so we can look into refilling it.

## 2019-06-05 ENCOUNTER — Other Ambulatory Visit: Payer: Self-pay | Admitting: Family Medicine

## 2019-06-05 ENCOUNTER — Encounter: Payer: Self-pay | Admitting: Nurse Practitioner

## 2019-06-05 ENCOUNTER — Encounter: Payer: Medicaid Other | Admitting: Nurse Practitioner

## 2019-06-05 ENCOUNTER — Ambulatory Visit: Payer: Self-pay | Admitting: *Deleted

## 2019-06-05 ENCOUNTER — Telehealth: Payer: Self-pay | Admitting: Nurse Practitioner

## 2019-06-05 DIAGNOSIS — J4541 Moderate persistent asthma with (acute) exacerbation: Secondary | ICD-10-CM

## 2019-06-05 NOTE — Telephone Encounter (Signed)
Spoke with at [facility], Pharmacist [facility]; he states that there is refill script 12/09/18 q 8 hrs; he also has a script for 4/29/202 q 6 hrs which was placed on hold; will send to MD for clarification; pt then states that he has been short of breath and wheezing for 3 days; he states that it is worse with exertion; he says that he has been taking 228 to 219 over 2 days ( took 2 on 06/04/2019 pm, and 1 on 06/05/2019 in am); his BP 131/82 pm  06/04/2019, and 126/87 am 06/05/2019; he also says that he only has one duo-neb left; nurse triage initiated, and recommendations made per protocol; the pt says that he does not want to go to the ED because his symptoms get better when he rests; pt transferred to [facility] at [facility] for scheduling; review of chart shows that duo-neb 3 mls changed to q 6 ours on 04/26/2019; will notify pharmacy  he normally sees Dr Cathleen Corti; will route to office for notification.    Reason for Disposition . [1] MODERATE difficulty breathing (e.g., speaks in phrases, SOB even at rest, pulse 100-120) AND [2] NEW-onset or WORSE than normal  Answer Assessment - Initial Assessment Questions 1. RESPIRATORY STATUS: "Describe your breathing?" (e.g., wheezing, shortness of breath, unable to speak, severe coughing)      Shortness of breath 2. ONSET: "When did this breathing problem begin?"      06/02/2019 3. PATTERN "Does the difficult breathing come and go, or has it been constant since it started?"      intermittnt 4. SEVERITY: "How bad is your breathing?" (e.g., mild, moderate, severe)    - MILD: No SOB at rest, mild SOB with walking, speaks normally in sentences, can lay down, no retractions, pulse < 100.    - MODERATE: SOB at rest, SOB with minimal exertion and prefers to sit, cannot lie down flat, speaks in phrases, mild retractions, audible wheezing, pulse 100-120.    - SEVERE: Very SOB at rest, speaks in single words, struggling to breathe, sitting hunched forward, retractions, pulse > 120   moderate 5. RECURRENT SYMPTOM: "Have you had difficulty breathing before?" If so, ask: "When was the last time?" and "What happened that time?"     yes 6. CARDIAC HISTORY: "Do you have any history of heart disease?" (e.g., heart attack, angina, bypass surgery, angioplasty)      hypertension 7. LUNG HISTORY: "Do you have any history of lung disease?"  (e.g., pulmonary embolus, asthma, emphysema)    astma 8. CAUSE: "What do you think is causing the breathing problem?"     asthma 9. OTHER SYMPTOMS: "Do you have any other symptoms? (e.g., dizziness, runny nose, cough, chest pain, fever)     10. PREGNANCY: "Is there any chance you are pregnant?" "When was your last menstrual period?"       n/a 11. TRAVEL: "Have you traveled out of the country in the last month?" (e.g., travel history, exposures)       no  Protocols used: BREATHING DIFFICULTY-A-AH

## 2019-06-05 NOTE — Telephone Encounter (Signed)
Contacted Shamir, Pharmacist at [facility]; he states that the newest prescription that he had on file was dated 04/26/2019 for duo-neb q 6 hrs; he as filled this, and will have it sent to the patient.

## 2019-06-05 NOTE — Telephone Encounter (Signed)
Contacted Gary Buchanan about today's virtual appt at 2pm. He informed me that he no longer needs appointment. He state he is feeling better. He wants appointment to be canceled. I asked him if he is having a respiratory difficulty? He stated he does not.

## 2019-06-05 NOTE — Telephone Encounter (Signed)
Per initial encounter 06/05/2019 at 1020, "Pt stated he received medication from the pharmacy (he could not tell me the name) and he was taking it every 4 hours instead of every 8 hours and is concerned. He was a bit confused about his medications."; contacted pt to discuss; the pt states that his medication in the mail (albuterol liquid); he says that his eye sight won't allow him to read it; spoke with Edd Arbour at [facility], and she is also unable to determine what medication the pt is taking about because there have been no changes to his medication; will contact pharmacy for assistance.

## 2019-06-06 NOTE — Progress Notes (Signed)
This encounter was created in error - please disregard.

## 2019-06-07 NOTE — Telephone Encounter (Signed)
Approval or no ?

## 2019-06-07 NOTE — Telephone Encounter (Signed)
No if pt is having symptoms, he needs appt. I do not feel comfortable refilling prednisone taper

## 2019-06-08 ENCOUNTER — Encounter: Payer: Self-pay | Admitting: Family Medicine

## 2019-06-08 ENCOUNTER — Ambulatory Visit: Payer: Medicare Other | Admitting: Family Medicine

## 2019-06-08 DIAGNOSIS — Z91199 Patient's noncompliance with other medical treatment and regimen due to unspecified reason: Secondary | ICD-10-CM

## 2019-06-08 DIAGNOSIS — Z5329 Procedure and treatment not carried out because of patient's decision for other reasons: Secondary | ICD-10-CM

## 2019-06-08 NOTE — Progress Notes (Signed)
Virtual Visit via Video Note  I attempted to connected with Gary Buchanan on 06/08/19 at  1:30 PM EDT by a video enabled telemedicine application after he completed the "rooming" process with LPN. Pt did not connect to [email or web address] despite 3 attempts to send him the link. I tried calling the cell/preferred number in his chart x 2 but it rang to VM. VM left at 1:41pm. Pt will need to reschedule appt for different day/time.  Additionally, if he made this appt to discuss med Rx'd by cardiology Dr. Harrington Challenger, he should reach out to her office to discuss this.    Chief Complaint  Patient presents with  . Medication Refill    cardem refill/ needs to stop his heart from racing/recieved this from his heart doctor in the past takes this prescription on and off.

## 2019-06-11 ENCOUNTER — Other Ambulatory Visit: Payer: Self-pay | Admitting: Family Medicine

## 2019-06-11 DIAGNOSIS — J4541 Moderate persistent asthma with (acute) exacerbation: Secondary | ICD-10-CM

## 2019-06-12 ENCOUNTER — Telehealth: Payer: Self-pay | Admitting: Family Medicine

## 2019-06-12 ENCOUNTER — Other Ambulatory Visit: Payer: Self-pay | Admitting: Family Medicine

## 2019-06-12 DIAGNOSIS — J4541 Moderate persistent asthma with (acute) exacerbation: Secondary | ICD-10-CM

## 2019-06-12 NOTE — Telephone Encounter (Signed)
Gary Buchanan left VM to make appointment to get refill for prednisone.

## 2019-06-12 NOTE — Telephone Encounter (Signed)
Copied from [place] [phone]. Topic: Quick Communication - Rx Refill/Question >> Jun 12, 2019  8:38 AM Richardo Priest, NT wrote: Medication:  predniSONE (DELTASONE) 20 MG tablet  Has the patient contacted their pharmacy? Yes and advised to call office.   Preferred Pharmacy (with phone number or street name):  [id number] [place], [place] - [address] [phone] (Phone) [phone] (Fax)  Agent: Please be advised that RX refills may take up to 3 business days. We ask that you follow-up with your pharmacy.

## 2019-06-13 NOTE — Telephone Encounter (Signed)
Dr.C please advise, Ni[facility] called and left VM to schedule appointment.

## 2019-06-14 DIAGNOSIS — J449 Chronic obstructive pulmonary disease, unspecified: Secondary | ICD-10-CM | POA: Diagnosis not present

## 2019-06-14 DIAGNOSIS — N183 Chronic kidney disease, stage 3 (moderate): Secondary | ICD-10-CM | POA: Diagnosis not present

## 2019-06-14 DIAGNOSIS — G4733 Obstructive sleep apnea (adult) (pediatric): Secondary | ICD-10-CM | POA: Diagnosis not present

## 2019-06-14 DIAGNOSIS — J9601 Acute respiratory failure with hypoxia: Secondary | ICD-10-CM | POA: Diagnosis not present

## 2019-06-14 DIAGNOSIS — R0902 Hypoxemia: Secondary | ICD-10-CM | POA: Diagnosis not present

## 2019-06-14 DIAGNOSIS — J4521 Mild intermittent asthma with (acute) exacerbation: Secondary | ICD-10-CM | POA: Diagnosis not present

## 2019-06-14 DIAGNOSIS — E1122 Type 2 diabetes mellitus with diabetic chronic kidney disease: Secondary | ICD-10-CM | POA: Diagnosis not present

## 2019-06-14 DIAGNOSIS — R0602 Shortness of breath: Secondary | ICD-10-CM | POA: Diagnosis not present

## 2019-06-14 DIAGNOSIS — E119 Type 2 diabetes mellitus without complications: Secondary | ICD-10-CM | POA: Diagnosis not present

## 2019-06-14 DIAGNOSIS — I129 Hypertensive chronic kidney disease with stage 1 through stage 4 chronic kidney disease, or unspecified chronic kidney disease: Secondary | ICD-10-CM | POA: Diagnosis not present

## 2019-06-14 DIAGNOSIS — R600 Localized edema: Secondary | ICD-10-CM | POA: Diagnosis not present

## 2019-06-14 DIAGNOSIS — J4541 Moderate persistent asthma with (acute) exacerbation: Secondary | ICD-10-CM | POA: Diagnosis not present

## 2019-06-14 DIAGNOSIS — E1136 Type 2 diabetes mellitus with diabetic cataract: Secondary | ICD-10-CM | POA: Diagnosis not present

## 2019-06-15 DIAGNOSIS — G4733 Obstructive sleep apnea (adult) (pediatric): Secondary | ICD-10-CM | POA: Diagnosis not present

## 2019-06-15 DIAGNOSIS — R600 Localized edema: Secondary | ICD-10-CM | POA: Diagnosis not present

## 2019-06-15 DIAGNOSIS — E1136 Type 2 diabetes mellitus with diabetic cataract: Secondary | ICD-10-CM | POA: Diagnosis not present

## 2019-06-15 DIAGNOSIS — I129 Hypertensive chronic kidney disease with stage 1 through stage 4 chronic kidney disease, or unspecified chronic kidney disease: Secondary | ICD-10-CM | POA: Diagnosis not present

## 2019-06-15 DIAGNOSIS — E1122 Type 2 diabetes mellitus with diabetic chronic kidney disease: Secondary | ICD-10-CM | POA: Diagnosis not present

## 2019-06-15 DIAGNOSIS — J4541 Moderate persistent asthma with (acute) exacerbation: Secondary | ICD-10-CM | POA: Diagnosis not present

## 2019-06-15 DIAGNOSIS — N183 Chronic kidney disease, stage 3 (moderate): Secondary | ICD-10-CM | POA: Diagnosis not present

## 2019-06-15 DIAGNOSIS — J449 Chronic obstructive pulmonary disease, unspecified: Secondary | ICD-10-CM | POA: Diagnosis not present

## 2019-06-15 DIAGNOSIS — J9601 Acute respiratory failure with hypoxia: Secondary | ICD-10-CM | POA: Diagnosis not present

## 2019-06-16 NOTE — Telephone Encounter (Signed)
Pt went to ED [facility], has a hospital f/u 6/25

## 2019-06-21 ENCOUNTER — Telehealth: Payer: Self-pay | Admitting: Family Medicine

## 2019-06-21 NOTE — Telephone Encounter (Signed)
I called and left message on patient voicemail, per Dr. Bryan Lemma patient must come in for office visit on appointment already scheduled for 06/22/2019. not a virtual visit. Per Dr. Bryan Lemma due to patient being seen in the ED, she needs to listen to lungs and heart.

## 2019-06-22 ENCOUNTER — Inpatient Hospital Stay: Payer: Medicare Other | Admitting: Family Medicine

## 2019-06-23 ENCOUNTER — Encounter: Payer: Self-pay | Admitting: Family Medicine

## 2019-07-13 ENCOUNTER — Telehealth: Payer: Self-pay | Admitting: Family Medicine

## 2019-07-13 NOTE — Telephone Encounter (Signed)
Medication Refill - Medication: cefinib  Has the patient contacted their pharmacy? No. (Agent: If no, request that the patient contact the pharmacy for the refill.) (Agent: If yes, when and what did the pharmacy advise?)  Preferred [facility] ([facility] phone number [facility] [place]  Pt says this was given to him on the hospital for congestion and pt is still very congested.     Agent: Please be advised that RX refills may take up to 3 business days. We ask that you follow-up with your pharmacy.

## 2019-07-14 NOTE — Telephone Encounter (Signed)
Pt has been dismissed from our practice so I am not able to refill this Rx

## 2019-07-19 DIAGNOSIS — I129 Hypertensive chronic kidney disease with stage 1 through stage 4 chronic kidney disease, or unspecified chronic kidney disease: Secondary | ICD-10-CM | POA: Diagnosis not present

## 2019-07-19 DIAGNOSIS — Z7984 Long term (current) use of oral hypoglycemic drugs: Secondary | ICD-10-CM | POA: Diagnosis not present

## 2019-07-19 DIAGNOSIS — N183 Chronic kidney disease, stage 3 (moderate): Secondary | ICD-10-CM | POA: Diagnosis not present

## 2019-07-19 DIAGNOSIS — E1122 Type 2 diabetes mellitus with diabetic chronic kidney disease: Secondary | ICD-10-CM | POA: Diagnosis not present

## 2019-07-19 DIAGNOSIS — D72829 Elevated white blood cell count, unspecified: Secondary | ICD-10-CM | POA: Diagnosis not present

## 2019-07-19 DIAGNOSIS — G4733 Obstructive sleep apnea (adult) (pediatric): Secondary | ICD-10-CM | POA: Diagnosis not present

## 2019-07-19 DIAGNOSIS — R0602 Shortness of breath: Secondary | ICD-10-CM | POA: Diagnosis not present

## 2019-07-19 DIAGNOSIS — R9431 Abnormal electrocardiogram [ECG] [EKG]: Secondary | ICD-10-CM | POA: Diagnosis not present

## 2019-07-19 DIAGNOSIS — J441 Chronic obstructive pulmonary disease with (acute) exacerbation: Secondary | ICD-10-CM | POA: Diagnosis not present

## 2019-07-19 DIAGNOSIS — N182 Chronic kidney disease, stage 2 (mild): Secondary | ICD-10-CM | POA: Diagnosis not present

## 2019-07-19 DIAGNOSIS — I471 Supraventricular tachycardia: Secondary | ICD-10-CM | POA: Diagnosis not present

## 2019-07-19 DIAGNOSIS — T486X5A Adverse effect of antiasthmatics, initial encounter: Secondary | ICD-10-CM | POA: Diagnosis not present

## 2019-07-19 DIAGNOSIS — Z833 Family history of diabetes mellitus: Secondary | ICD-10-CM | POA: Diagnosis not present

## 2019-07-20 DIAGNOSIS — J441 Chronic obstructive pulmonary disease with (acute) exacerbation: Secondary | ICD-10-CM | POA: Diagnosis not present

## 2019-07-20 DIAGNOSIS — I471 Supraventricular tachycardia: Secondary | ICD-10-CM | POA: Diagnosis not present

## 2019-07-20 DIAGNOSIS — E1122 Type 2 diabetes mellitus with diabetic chronic kidney disease: Secondary | ICD-10-CM | POA: Diagnosis not present

## 2019-07-20 DIAGNOSIS — G4733 Obstructive sleep apnea (adult) (pediatric): Secondary | ICD-10-CM | POA: Diagnosis not present

## 2019-07-20 DIAGNOSIS — Z833 Family history of diabetes mellitus: Secondary | ICD-10-CM | POA: Diagnosis not present

## 2019-07-20 DIAGNOSIS — D72829 Elevated white blood cell count, unspecified: Secondary | ICD-10-CM | POA: Diagnosis not present

## 2019-07-20 DIAGNOSIS — T486X5A Adverse effect of antiasthmatics, initial encounter: Secondary | ICD-10-CM | POA: Diagnosis not present

## 2019-07-20 DIAGNOSIS — R0602 Shortness of breath: Secondary | ICD-10-CM | POA: Diagnosis not present

## 2019-07-20 DIAGNOSIS — I129 Hypertensive chronic kidney disease with stage 1 through stage 4 chronic kidney disease, or unspecified chronic kidney disease: Secondary | ICD-10-CM | POA: Diagnosis not present

## 2019-07-20 DIAGNOSIS — N183 Chronic kidney disease, stage 3 (moderate): Secondary | ICD-10-CM | POA: Diagnosis not present

## 2019-07-20 DIAGNOSIS — Z7984 Long term (current) use of oral hypoglycemic drugs: Secondary | ICD-10-CM | POA: Diagnosis not present

## 2019-07-21 DIAGNOSIS — R05 Cough: Secondary | ICD-10-CM | POA: Diagnosis not present

## 2019-07-21 DIAGNOSIS — R000 Tachycardia, unspecified: Secondary | ICD-10-CM | POA: Diagnosis not present

## 2019-07-21 DIAGNOSIS — R002 Palpitations: Secondary | ICD-10-CM | POA: Diagnosis not present

## 2019-07-21 DIAGNOSIS — J441 Chronic obstructive pulmonary disease with (acute) exacerbation: Secondary | ICD-10-CM | POA: Diagnosis not present

## 2019-07-21 DIAGNOSIS — R062 Wheezing: Secondary | ICD-10-CM | POA: Diagnosis not present

## 2019-07-21 DIAGNOSIS — R0600 Dyspnea, unspecified: Secondary | ICD-10-CM | POA: Diagnosis not present

## 2019-07-21 DIAGNOSIS — I471 Supraventricular tachycardia: Secondary | ICD-10-CM | POA: Diagnosis not present

## 2019-07-23 DIAGNOSIS — R001 Bradycardia, unspecified: Secondary | ICD-10-CM | POA: Diagnosis not present

## 2019-07-23 DIAGNOSIS — I213 ST elevation (STEMI) myocardial infarction of unspecified site: Secondary | ICD-10-CM | POA: Diagnosis not present

## 2019-07-23 DIAGNOSIS — I493 Ventricular premature depolarization: Secondary | ICD-10-CM | POA: Diagnosis not present

## 2019-07-24 ENCOUNTER — Other Ambulatory Visit: Payer: Self-pay

## 2019-07-24 ENCOUNTER — Ambulatory Visit (INDEPENDENT_AMBULATORY_CARE_PROVIDER_SITE_OTHER): Payer: Medicare Other | Admitting: Podiatry

## 2019-07-24 ENCOUNTER — Encounter: Payer: Self-pay | Admitting: Podiatry

## 2019-07-24 VITALS — BP 145/105 | HR 97 | Temp 98.0°F

## 2019-07-24 DIAGNOSIS — E119 Type 2 diabetes mellitus without complications: Secondary | ICD-10-CM

## 2019-07-24 DIAGNOSIS — M79674 Pain in right toe(s): Secondary | ICD-10-CM

## 2019-07-24 DIAGNOSIS — B351 Tinea unguium: Secondary | ICD-10-CM | POA: Diagnosis not present

## 2019-07-24 DIAGNOSIS — M79675 Pain in left toe(s): Secondary | ICD-10-CM

## 2019-07-24 NOTE — Patient Instructions (Signed)
Diabetes Mellitus and Foot Care Foot care is an important part of your health, especially when you have diabetes. Diabetes may cause you to have problems because of poor blood flow (circulation) to your feet and legs, which can cause your skin to:  Become thinner and drier.  Break more easily.  Heal more slowly.  Peel and crack. You may also have nerve damage (neuropathy) in your legs and feet, causing decreased feeling in them. This means that you may not notice minor injuries to your feet that could lead to more serious problems. Noticing and addressing any potential problems early is the best way to prevent future foot problems. How to care for your feet Foot hygiene  Wash your feet daily with warm water and mild soap. Do not use hot water. Then, pat your feet and the areas between your toes until they are completely dry. Do not soak your feet as this can dry your skin.  Trim your toenails straight across. Do not dig under them or around the cuticle. File the edges of your nails with an emery board or nail file.  Apply a moisturizing lotion or petroleum jelly to the skin on your feet and to dry, brittle toenails. Use lotion that does not contain alcohol and is unscented. Do not apply lotion between your toes. Shoes and socks  Wear clean socks or stockings every day. Make sure they are not too tight. Do not wear knee-high stockings since they may decrease blood flow to your legs.  Wear shoes that fit properly and have enough cushioning. Always look in your shoes before you put them on to be sure there are no objects inside.  To break in new shoes, wear them for just a few hours a day. This prevents injuries on your feet. Wounds, scrapes, corns, and calluses  Check your feet daily for blisters, cuts, bruises, sores, and redness. If you cannot see the bottom of your feet, use a mirror or ask someone for help.  Do not cut corns or calluses or try to remove them with medicine.  If you  find a minor scrape, cut, or break in the skin on your feet, keep it and the skin around it clean and dry. You may clean these areas with mild soap and water. Do not clean the area with peroxide, alcohol, or iodine.  If you have a wound, scrape, corn, or callus on your foot, look at it several times a day to make sure it is healing and not infected. Check for: ? Redness, swelling, or pain. ? Fluid or blood. ? Warmth. ? Pus or a bad smell. General instructions  Do not cross your legs. This may decrease blood flow to your feet.  Do not use heating pads or hot water bottles on your feet. They may burn your skin. If you have lost feeling in your feet or legs, you may not know this is happening until it is too late.  Protect your feet from hot and cold by wearing shoes, such as at the beach or on hot pavement.  Schedule a complete foot exam at least once a year (annually) or more often if you have foot problems. If you have foot problems, report any cuts, sores, or bruises to your health care provider immediately. Contact a health care provider if:  You have a medical condition that increases your risk of infection and you have any cuts, sores, or bruises on your feet.  You have an injury that is not   healing.  You have redness on your legs or feet.  You feel burning or tingling in your legs or feet.  You have pain or cramps in your legs and feet.  Your legs or feet are numb.  Your feet always feel cold.  You have pain around a toenail. Get help right away if:  You have a wound, scrape, corn, or callus on your foot and: ? You have pain, swelling, or redness that gets worse. ? You have fluid or blood coming from the wound, scrape, corn, or callus. ? Your wound, scrape, corn, or callus feels warm to the touch. ? You have pus or a bad smell coming from the wound, scrape, corn, or callus. ? You have a fever. ? You have a red line going up your leg. Summary  Check your feet every day  for cuts, sores, red spots, swelling, and blisters.  Moisturize feet and legs daily.  Wear shoes that fit properly and have enough cushioning.  If you have foot problems, report any cuts, sores, or bruises to your health care provider immediately.  Schedule a complete foot exam at least once a year (annually) or more often if you have foot problems. This information is not intended to replace advice given to you by your health care provider. Make sure you discuss any questions you have with your health care provider. Document Released: 12/11/2000 Document Revised: 01/26/2018 Document Reviewed: 01/15/2017 Elsevier Patient Education  2020 [employer]   Onychomycosis/Fungal Toenails  WHAT IS IT? An infection that lies within the keratin of your nail plate that is caused by a fungus.  WHY ME? Fungal infections affect all ages, sexes, races, and creeds.  There may be many factors that predispose you to a fungal infection such as age, coexisting medical conditions such as diabetes, or an autoimmune disease; stress, medications, fatigue, genetics, etc.  Bottom line: fungus thrives in a warm, moist environment and your shoes offer such a location.  IS IT CONTAGIOUS? Theoretically, yes.  You do not want to share shoes, nail clippers or files with someone who has fungal toenails.  Walking around barefoot in the same room or sleeping in the same bed is unlikely to transfer the organism.  It is important to realize, however, that fungus can spread easily from one nail to the next on the same foot.  HOW DO WE TREAT THIS?  There are several ways to treat this condition.  Treatment may depend on many factors such as age, medications, pregnancy, liver and kidney conditions, etc.  It is best to ask your doctor which options are available to you.  1. No treatment.   Unlike many other medical concerns, you can live with this condition.  However for many people this can be a painful condition and may lead to  ingrown toenails or a bacterial infection.  It is recommended that you keep the nails cut short to help reduce the amount of fungal nail. 2. Topical treatment.  These range from herbal remedies to prescription strength nail lacquers.  About 40-50% effective, topicals require twice daily application for approximately 9 to 12 months or until an entirely new nail has grown out.  The most effective topicals are medical grade medications available through physicians offices. 3. Oral antifungal medications.  With an 80-90% cure rate, the most common oral medication requires 3 to 4 months of therapy and stays in your system for a year as the new nail grows out.  Oral antifungal medications do require   blood work to make sure it is a safe drug for you.  A liver function panel will be performed prior to starting the medication and after the first month of treatment.  It is important to have the blood work performed to avoid any harmful side effects.  In general, this medication safe but blood work is required. 4. Laser Therapy.  This treatment is performed by applying a specialized laser to the affected nail plate.  This therapy is noninvasive, fast, and non-painful.  It is not covered by insurance and is therefore, out of pocket.  The results have been very good with a 80-95% cure rate.  [facility] is the only practice in the area to offer this therapy. 5. Permanent Nail Avulsion.  Removing the entire nail so that a new nail will not grow back. 

## 2019-07-27 NOTE — Progress Notes (Signed)
Subjective: Gary Buchanan presents today for diabetic foot evaluation.  He states he was recently discharged from hospital due to asthma exacerbation. He currently has no PCP.   Patient relates 1-2  year history of diabetes.  Patient denies any history of foot wounds.  Patient denies any history of numbness, tingling, burning, pins/needles sensations.  Today, patient c/o of painful, discolored, thick toenails which interfere with daily activities.  Pain is aggravated when wearing enclosed shoe gear.   He has seen a Podiatrist in the past and his former Cassell Smiles has retired.  Past Medical History:  Diagnosis Date  . A-fib ([place])   . Asthma   . Blindness of left eye   . Chronic kidney disease   . Diabetes mellitus without complication ([place])   . Gynecomastia   . Hypertension   . Left knee pain     Patient Active Problem List   Diagnosis Date Noted  . Non-compliance 04/26/2019  . Acute on chronic respiratory failure with hypoxia and hypercapnia ([place]) 05/31/2018  . Asthma exacerbation 11/09/2017  . Chest pain 10/11/2017  . CHF (congestive heart failure) ([place]) 10/11/2017  . Bilateral chronic serous otitis media 09/23/2017  . SOB (shortness of breath) 07/15/2017  . Asthma 06/21/2017  . Blindness of left eye 06/21/2017  . New onset a-fib ([place]) 06/21/2017  . Type 2 diabetes mellitus with complication, without long-term current use of insulin ([place]) 06/21/2017  . Hypertension 06/21/2017  . Obstructive sleep apnea syndrome 06/21/2017  . 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 Hypertensive retinopathy 01/28/2016  . Nuclear sclerotic cataract 01/28/2016  . Presbyopia 01/28/2016  . Ptosis of eyelid 01/28/2016  . Mood disorder ([place]) 01/07/2015  . Abnormal weight gain 12/11/2014  . Hypercalcemia 12/11/2014  . Muscle cramping 11/19/2014  . Hyperprolactinemia ([place]) 10/10/2014  . Male  hypogonadism 10/10/2014  . Chronic gout due to renal impairment, left ankle and foot, without tophus (tophi) 10/02/2014  . Chronic gout of left foot due to renal impairment 10/02/2014  . Chronic gout of right foot due to renal impairment 10/02/2014  . Adrenal adenoma 07/23/2014  . Gynecomastia 01/09/2014  . Arthritis, multiple joint involvement 11/10/2013  . Disorder of joint 11/10/2013  . Anemia due to chronic kidney disease 01/31/2013  . Chronic kidney disease, stage III (moderate) ([place]) 01/31/2013  . Disorder of optic nerve 01/27/2013  . Glaucoma suspect 01/27/2013  . Nuclear senile cataract 01/27/2013  . Optic atrophy 01/27/2013  . 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  . 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 Senile nuclear sclerosis 01/27/2013  . Herpes dermatitis 11/30/2012  . Herpes simplex complications 78/46/9629  . Herpes simplex with complication 52/84/1324  . Leg cramps 11/30/2012  . Blepharitis 09/15/2012  . Impairment level: one eye: total impairment: other eye: not specified 09/15/2012  . Myogenic ptosis of eyelid 09/15/2012  . Ptosis, myogenic 09/15/2012  . Total impairment of one eye 09/15/2012  . Visual loss, one eye, no light perception ([facility]) 09/15/2012  . 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[place]) 08/19/2012  . 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 Chronic sinusitis 07/06/2012  . Hypokalemia 07/06/2012  . Hyperaldosteronism ([place]) 04/28/2012  . Morbid obesity ([place]) 04/25/2012    Past Surgical History:  Procedure Laterality Date  . ADRENALECTOMY    . EYE SURGERY    . HERNIA REPAIR    . SINUS ENDO WITH FUSION  10/17/2012  Procedure: SINUS ENDO WITH FUSION;  Surgeon: Ascencion Dike, MD;  Location: [facility];  Service: ENT;  Laterality: Bilateral;  Bilateral Ethmoidectomy,  Bilateral Maxillary Antrostomy, Bilateral Frontal Recess Exploration,   W/ Fusion Protocol     Current Outpatient Medications:  .  albuterol  (PROVENTIL HFA;VENTOLIN HFA) 108 (90 Base) MCG/ACT inhaler, Inhale 2 puffs into the lungs every 6 (six) hours as needed., Disp: 3 Inhaler, Rfl: 1 .  allopurinol (ZYLOPRIM) 300 MG tablet, Take 1 tablet (300 mg total) by mouth daily., Disp: 90 tablet, Rfl: 3 .  amLODipine (NORVASC) 10 MG tablet, Take 1 tablet (10 mg total) by mouth daily., Disp: 90 tablet, Rfl: 3 .  aspirin EC 81 MG tablet, Take 81 mg by mouth daily., Disp: , Rfl:  .  Blood Glucose Monitoring Suppl (ACCU-CHEK AVIVA PLUS) w/Device KIT, USE DAILY AS DIRECTED, Disp: 1 kit, Rfl: 0 .  Brinzolamide-Brimonidine 1-0.2 % SUSP, Place 1 drop into the right eye 3 (three) times daily., Disp: , Rfl:  .  cefdinir (OMNICEF) 300 MG capsule, TAKE 1 CAPSULE (300 MG TOTAL) BY MOUTH EVERY 12 HOURS FOR 7 DAYS., Disp: , Rfl:  .  diclofenac sodium (VOLTAREN) 1 % GEL, Apply 2 g topically 4 (four) times daily., Disp: 100 g, Rfl: 3 .  ferrous sulfate 325 (65 FE) MG tablet, Take 325 mg by mouth daily with breakfast., Disp: , Rfl:  .  fluticasone (FLONASE) 50 MCG/ACT nasal spray, Place 2 sprays into both nostrils daily., Disp: 16 g, Rfl: 5 .  fluticasone (FLOVENT HFA) 110 MCG/ACT inhaler, Inhale 2 puffs into the lungs 2 (two) times daily., Disp: 1 Inhaler, Rfl: 12 .  folic acid (FOLVITE) 891 MCG tablet, Take 400 mcg by mouth daily., Disp: , Rfl:  .  furosemide (LASIX) 20 MG tablet, TAKE 1 TABLET BY MOUTH EVERY MORNING FOR BLOOD PRESSURE AND FLUID RETENTION., Disp: 90 tablet, Rfl: 3 .  ipratropium-albuterol (DUONEB) 0.5-2.5 (3) MG/3ML SOLN, Take 3 mLs by nebulization every 6 (six) hours as needed., Disp: 360 mL, Rfl: 5 .  loratadine (CLARITIN) 10 MG tablet, TAKE 1 TABLET BY MOUTH EVERY DAY, Disp: 90 tablet, Rfl: 3 .  losartan (COZAAR) 50 MG tablet, Take 1 tablet (50 mg total) by mouth daily., Disp: 90 tablet, Rfl: 3 .  magnesium gluconate (MAGONATE) 500 MG tablet, Take 1 tablet (500 mg total) by mouth daily., Disp: 90 tablet, Rfl: 3 .  montelukast (SINGULAIR) 10  MG tablet, Take 10 mg by mouth daily., Disp: , Rfl:  .  Multiple Vitamins-Minerals (SENTRY SENIOR PO), Take 1 tablet by mouth daily., Disp: , Rfl:  .  predniSONE (DELTASONE) 20 MG tablet, 3 tabs po x 3 days, 2 tabs po x 3 days, 1 tab po x 3 days, 1/2 tab po x 3 days, Disp: 20 tablet, Rfl: 0 .  pyridOXINE (VITAMIN B-6) 100 MG tablet, Take 100 mg by mouth daily., Disp: , Rfl:  .  sitaGLIPtin (JANUVIA) 50 MG tablet, Take 1 tablet (50 mg total) by mouth daily., Disp: 90 tablet, Rfl: 3 .  timolol (BETIMOL) 0.25 % ophthalmic solution, Place 1-2 drops into the right eye 2 (two) times daily., Disp: , Rfl:  .  timolol (TIMOPTIC) 0.25 % ophthalmic solution, Place 1 drop into both eyes daily., Disp: , Rfl:  .  travoprost, benzalkonium, (TRAVATAN) 0.004 % ophthalmic solution, Place 1 drop into the right eye at bedtime., Disp: , Rfl:  .  valACYclovir (VALTREX) 1000 MG tablet, Take 1 tablet (1,000  mg total) by mouth daily., Disp: 90 tablet, Rfl: 3 .  vitamin E (VITAMIN E) 200 UNIT capsule, Take 200 Units by mouth daily., Disp: , Rfl:   Allergies  Allergen Reactions  . Latex Itching  . Lisinopril Swelling    Other reaction(s): Other Lip swelling  . Ace Inhibitors Swelling    Lips swells  . Gabapentin Other (See Comments)    Other reaction(s): Other (See Comments) Rapid heartbeat  Other reaction(s): Other Rapid heartbeat  Other reaction(s): Other (See Comments) Rapid heartbeat  Rapid heartbeat   . Tizanidine Other (See Comments)    Other reaction(s): Other chest pain  Other reaction(s): Other (See Comments) Chest pain Other reaction(s): Other (See Comments) Chest pain chest pain     Social History   Occupational History  . Not on file  Tobacco Use  . Smoking status: Never Smoker  . Smokeless tobacco: Never Used  Substance and Sexual Activity  . Alcohol use: No  . Drug use: No  . Sexual activity: Not Currently    Birth control/protection: Abstinence    Family History  Problem  Relation Age of Onset  . Diabetes Mother   . Hypertension Mother   . Hyperlipidemia Mother   . Diabetes Father   . Hypertension Father   . Diabetes Sister   . Hypertension Sister   . Diabetes Brother   . Hypertension Brother     Immunization History  Administered Date(s) Administered  . Pneumococcal Polysaccharide-23 12/09/2018  . Tdap 12/09/2018    Review of systems: Positive Findings in bold print.  Constitutional:  chills, fatigue, fever, sweats, weight change Communication: Optometrist, sign Ecologist, hand writing, iPad/Android device Head: headaches, head injury Eyes: changes in vision, eye pain, glaucoma, cataracts, macular degeneration, diplopia, glare,  light sensitivity, eyeglasses or contacts, blindness Ears nose mouth throat: hearing impaired, hearing aids,  ringing in ears, deaf, sign language,  vertigo, nosebleeds,  rhinitis,  cold sores, snoring, swollen glands, chronic ear infections Cardiovascular: HTN, edema, arrhythmia, pacemaker in place, defibrillator in place, chest pain/tightness, chronic anticoagulation, blood clot, heart failure, MI Peripheral Vascular: leg cramps, varicose veins, blood clots, lymphedema, varicosities Respiratory:  Asthma, difficulty breathing, denies congestion, SOB, wheezing, cough, emphysema Gastrointestinal: change in appetite or weight, abdominal pain, constipation, diarrhea, nausea, vomiting, vomiting blood, change in bowel habits, abdominal pain, jaundice, rectal bleeding, hemorrhoids, GERD Genitourinary:  nocturia,  pain on urination, polyuria,  blood in urine, Foley catheter, urinary urgency, ESRD on hemodialysis Musculoskeletal: amputation, cramping, stiff joints, painful joints, decreased joint motion, fractures, OA, gout, hemiplegia, paraplegia, uses cane, wheelchair bound, uses walker, uses rollator Skin: +changes in toenails, color change, dryness, itching, mole changes,  rash, wound(s) Neurological: headaches, numbness  in feet, paresthesias in feet, burning in feet, fainting,  seizures, change in speech,  headaches, memory problems/poor historian, cerebral palsy, weakness, paralysis, CVA, TIA Endocrine: diabetes, hypothyroidism, hyperthyroidism,  goiter, dry mouth, flushing, heat intolerance,  cold intolerance,  excessive thirst, denies polyuria,  nocturia Hematological:  easy bleeding, excessive bleeding, easy bruising, enlarged lymph nodes, on long term blood thinner, history of past transusions Allergy/immunological:  hives, eczema, frequent infections, multiple drug allergies, seasonal allergies, transplant recipient, multiple food allergies Psychiatric:  anxiety, depression, mood disorder, suicidal ideations, hallucinations, insomnia  Objective: Vitals:   07/24/19 0837  BP: (!) 145/105  Pulse: 97  Temp: 98 F (36.7 C)   Vascular Examination: Capillary refill time immediate x 10 digits.  Dorsalis pedis pulses palpable b/l.  Posterior tibial pulses palpable b/l.  Digital hair  sparse x 10 digits.  Skin temperature gradient WNL b/l  Dermatological Examination: Skin with normal turgor, texture and tone b/l.  Toenails 1-5 b/l discolored, thick, dystrophic with subungual debris and pain with palpation to nailbeds due to thickness of nails.  Musculoskeletal: Muscle strength 5/5 to all LE muscle groups.  HAV with bunion deformity b/l.  Neurological: Sensation intact 5/5 b/l with 10 gram monofilament.  Vibratory sensation intact b/l.  Assessment: 1. Painful onychomycosis toenails 1-5 b/l  2. NIDDM  Plan: 1. Advised patient to choose and schedule an appointment with a new PCP ASAP who will help him manage his chronic health issues. He related understanding. 2. Discussed diabetic foot care principles. Literature dispensed on today. 3. Toenails 1-5 b/l were debrided in length and girth without iatrogenic bleeding. 4. Patient to continue soft, supportive shoe gear 5. Patient to report any  pedal injuries to medical professional immediately. 6. Follow up 3 months.  7. Patient/POA to call should there be a concern in the interim.

## 2019-07-28 ENCOUNTER — Telehealth: Payer: Self-pay

## 2019-07-28 NOTE — Telephone Encounter (Signed)
Pt does not want to see another provider, especially male. I have rescheduled his appt for first available with Dr Harrington Challenger on October 9. Pt denies any acute sx at this time.

## 2019-07-31 ENCOUNTER — Ambulatory Visit: Payer: Medicaid Other | Admitting: Internal Medicine

## 2019-08-17 NOTE — Progress Notes (Deleted)
New Patient Note  RE: Gary Buchanan MRN: [id number] DOB: 17-May-1966 Date of Office Visit: 08/18/2019  Referring provider: Ronnald Nian, DO Primary care provider: Ronnald Nian, DO  Chief Complaint: No chief complaint on file.  History of Present Illness: I had the pleasure of seeing Gary Buchanan for initial evaluation at the [facility] on 08/17/2019. He is a 53 y.o. male, who is referred here by Ronnald Nian, DO for the evaluation of ***. He is accompanied today by his *** who provided/contributed to the history.   ***  Assessment and Plan: [name] is a 53 y.o. male with: No problem-specific Assessment & Plan notes found for this encounter.  No follow-ups on file.  No orders of the defined types were placed in this encounter.  Lab Orders  No laboratory test(s) ordered today    Other allergy screening: Asthma: {Blank single:19197::"yes","no"} Rhino conjunctivitis: {Blank single:19197::"yes","no"} Food allergy: {Blank single:19197::"yes","no"} Medication allergy: {Blank single:19197::"yes","no"} Hymenoptera allergy: {Blank single:19197::"yes","no"} Urticaria: {Blank single:19197::"yes","no"} Eczema:{Blank single:19197::"yes","no"} History of recurrent infections suggestive of immunodeficency: {Blank single:19197::"yes","no"}  Diagnostics: Spirometry:  Tracings reviewed. His effort: {Blank single:19197::"Good reproducible efforts.","It was hard to get consistent efforts and there is a question as to whether this reflects a maximal maneuver.","Poor effort, data can not be interpreted."} FVC: ***L FEV1: ***L, ***% predicted FEV1/FVC ratio: ***% Interpretation: {Blank single:19197::"Spirometry consistent with mild obstructive disease","Spirometry consistent with moderate obstructive disease","Spirometry consistent with severe obstructive disease","Spirometry consistent with possible restrictive disease","Spirometry consistent with mixed  obstructive and restrictive disease","Spirometry uninterpretable due to technique","Spirometry consistent with normal pattern","No overt abnormalities noted given today's efforts"}.  Please see scanned spirometry results for details.  Skin Testing: {Blank single:19197::"Select foods","Environmental allergy panel","Environmental allergy panel and select foods","Food allergy panel","None","Deferred due to recent antihistamines use"}. Positive test to: ***. Negative test to: ***.  Results discussed with patient/family.   Past Medical History: Patient Active Problem List   Diagnosis Date Noted  . Non-compliance 04/26/2019  . Acute on chronic respiratory failure with hypoxia and hypercapnia ([place]) 05/31/2018  . Asthma exacerbation 11/09/2017  . Chest pain 10/11/2017  . CHF (congestive heart failure) ([place]) 10/11/2017  . Bilateral chronic serous otitis media 09/23/2017  . SOB (shortness of breath) 07/15/2017  . Asthma 06/21/2017  . Blindness of left eye 06/21/2017  . New onset a-fib ([place]) 06/21/2017  . Type 2 diabetes mellitus with complication, without long-term current use of insulin ([place]) 06/21/2017  . Hypertension 06/21/2017  . Obstructive sleep apnea syndrome 06/21/2017  . 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 Hypertensive retinopathy 01/28/2016  . Nuclear sclerotic cataract 01/28/2016  . Presbyopia 01/28/2016  . Ptosis of eyelid 01/28/2016  . Mood disorder ([place]) 01/07/2015  . Abnormal weight gain 12/11/2014  . Hypercalcemia 12/11/2014  . Muscle cramping 11/19/2014  . Hyperprolactinemia ([place]) 10/10/2014  . Male hypogonadism 10/10/2014  . Chronic gout due to renal impairment, left ankle and foot, without tophus (tophi) 10/02/2014  . Chronic gout of left foot due to renal impairment 10/02/2014  . Chronic gout of right foot due to renal impairment 10/02/2014  . Adrenal adenoma  07/23/2014  . Gynecomastia 01/09/2014  . Arthritis, multiple joint involvement 11/10/2013  . Disorder of joint 11/10/2013  . Anemia due to chronic kidney disease 01/31/2013  . Chronic kidney disease, stage III (moderate) ([place]) 01/31/2013  . Disorder of optic nerve 01/27/2013  . Glaucoma suspect 01/27/2013  . Nuclear  senile cataract 01/27/2013  . Optic atrophy 01/27/2013  . 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  . 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 Senile nuclear sclerosis 01/27/2013  . Herpes dermatitis 11/30/2012  . Herpes simplex complications 86/57/8469  . Herpes simplex with complication 62/95/2841  . Leg cramps 11/30/2012  . Blepharitis 09/15/2012  . Impairment level: one eye: total impairment: other eye: not specified 09/15/2012  . Myogenic ptosis of eyelid 09/15/2012  . Ptosis, myogenic 09/15/2012  . Total impairment of one eye 09/15/2012  . Visual loss, one eye, no light perception ([facility]) 09/15/2012  . 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[place]) 08/19/2012  . Primary aldosteronism ([place]) 08/19/2012  . Chronic sinusitis 07/06/2012  . Hypokalemia 07/06/2012  . Hyperaldosteronism ([place]) 04/28/2012  . Morbid obesity ([place]) 04/25/2012   Past Medical History:  Diagnosis Date  . A-fib ([place])   . Asthma   . Blindness of left eye   . Chronic kidney disease   . Diabetes mellitus without complication ([place])   . Gynecomastia   . Hypertension   . Left knee pain    Past Surgical History: Past Surgical History:  Procedure Laterality Date  . ADRENALECTOMY    . EYE SURGERY    . HERNIA REPAIR    . SINUS ENDO WITH FUSION  10/17/2012   Procedure: SINUS ENDO WITH FUSION;  Surgeon: Ascencion Dike, MD;  Location: [facility];  Service: ENT;  Laterality: Bilateral;  Bilateral Ethmoidectomy,  Bilateral Maxillary Antrostomy, Bilateral Frontal Recess Exploration,   W/ Fusion Protocol   Medication List:  Current Outpatient Medications  Medication Sig Dispense  Refill  . albuterol (PROVENTIL HFA;VENTOLIN HFA) 108 (90 Base) MCG/ACT inhaler Inhale 2 puffs into the lungs every 6 (six) hours as needed. 3 Inhaler 1  . allopurinol (ZYLOPRIM) 300 MG tablet Take 1 tablet (300 mg total) by mouth daily. 90 tablet 3  . amLODipine (NORVASC) 10 MG tablet Take 1 tablet (10 mg total) by mouth daily. 90 tablet 3  . aspirin EC 81 MG tablet Take 81 mg by mouth daily.    . Blood Glucose Monitoring Suppl (ACCU-CHEK AVIVA PLUS) w/Device KIT USE DAILY AS DIRECTED 1 kit 0  . Brinzolamide-Brimonidine 1-0.2 % SUSP Place 1 drop into the right eye 3 (three) times daily.    . cefdinir (OMNICEF) 300 MG capsule TAKE 1 CAPSULE (300 MG TOTAL) BY MOUTH EVERY 12 HOURS FOR 7 DAYS.    Marland Kitchen diclofenac sodium (VOLTAREN) 1 % GEL Apply 2 g topically 4 (four) times daily. 100 g 3  . ferrous sulfate 325 (65 FE) MG tablet Take 325 mg by mouth daily with breakfast.    . fluticasone (FLONASE) 50 MCG/ACT nasal spray Place 2 sprays into both nostrils daily. 16 g 5  . fluticasone (FLOVENT HFA) 110 MCG/ACT inhaler Inhale 2 puffs into the lungs 2 (two) times daily. 1 Inhaler 12  . folic acid (FOLVITE) 324 MCG tablet Take 400 mcg by mouth daily.    . furosemide (LASIX) 20 MG tablet TAKE 1 TABLET BY MOUTH EVERY MORNING FOR BLOOD PRESSURE AND FLUID RETENTION. 90 tablet 3  . ipratropium-albuterol (DUONEB) 0.5-2.5 (3) MG/3ML SOLN Take 3 mLs by nebulization every 6 (six) hours as needed. 360 mL 5  . loratadine (CLARITIN) 10 MG tablet TAKE 1 TABLET BY MOUTH EVERY DAY 90 tablet 3  . losartan (COZAAR) 50 MG tablet Take 1 tablet (50 mg total) by mouth daily. 90 tablet 3  . magnesium gluconate (  MAGONATE) 500 MG tablet Take 1 tablet (500 mg total) by mouth daily. 90 tablet 3  . montelukast (SINGULAIR) 10 MG tablet Take 10 mg by mouth daily.    . Multiple Vitamins-Minerals (SENTRY SENIOR PO) Take 1 tablet by mouth daily.    . predniSONE (DELTASONE) 20 MG tablet 3 tabs po x 3 days, 2 tabs po x 3 days, 1 tab po x 3  days, 1/2 tab po x 3 days 20 tablet 0  . pyridOXINE (VITAMIN B-6) 100 MG tablet Take 100 mg by mouth daily.    . sitaGLIPtin (JANUVIA) 50 MG tablet Take 1 tablet (50 mg total) by mouth daily. 90 tablet 3  . timolol (BETIMOL) 0.25 % ophthalmic solution Place 1-2 drops into the right eye 2 (two) times daily.    . timolol (TIMOPTIC) 0.25 % ophthalmic solution Place 1 drop into both eyes daily.    . travoprost, benzalkonium, (TRAVATAN) 0.004 % ophthalmic solution Place 1 drop into the right eye at bedtime.    . valACYclovir (VALTREX) 1000 MG tablet Take 1 tablet (1,000 mg total) by mouth daily. 90 tablet 3  . vitamin E (VITAMIN E) 200 UNIT capsule Take 200 Units by mouth daily.     No current facility-administered medications for this visit.    Allergies: Allergies  Allergen Reactions  . Latex Itching  . Lisinopril Swelling    Other reaction(s): Other Lip swelling  . Ace Inhibitors Swelling    Lips swells  . Gabapentin Other (See Comments)    Other reaction(s): Other (See Comments) Rapid heartbeat  Other reaction(s): Other Rapid heartbeat  Other reaction(s): Other (See Comments) Rapid heartbeat  Rapid heartbeat   . Tizanidine Other (See Comments)    Other reaction(s): Other chest pain  Other reaction(s): Other (See Comments) Chest pain Other reaction(s): Other (See Comments) Chest pain chest pain    Social History: Social History   Socioeconomic History  . Marital status: Single    Spouse name: Not on file  . Number of children: Not on file  . Years of education: Not on file  . Highest education level: Not on file  Occupational History  . Not on file  Social Needs  . Financial resource strain: Not on file  . Food insecurity    Worry: Not on file    Inability: Not on file  . Transportation needs    Medical: Not on file    Non-medical: Not on file  Tobacco Use  . Smoking status: Never Smoker  . Smokeless tobacco: Never Used  Substance and Sexual Activity  .  Alcohol use: No  . Drug use: No  . Sexual activity: Not Currently    Birth control/protection: Abstinence  Lifestyle  . Physical activity    Days per week: Not on file    Minutes per session: Not on file  . Stress: Not on file  Relationships  . Social Herbalist on phone: Not on file    Gets together: Not on file    Attends religious service: Not on file    Active member of club or organization: Not on file    Attends meetings of clubs or organizations: Not on file    Relationship status: Not on file  Other Topics Concern  . Not on file  Social History Narrative  . Not on file   Lives in a ***. Smoking: *** Occupation: ***  Environmental History: Water Damage/mildew in the house: {Blank single:19197::"yes","no"} Carpet in the family  room: {Blank single:19197::"yes","no"} Carpet in the bedroom: {Blank single:19197::"yes","no"} Heating: {Blank single:19197::"electric","gas"} Cooling: {Blank single:19197::"central","window"} Pet: {Blank single:19197::"yes ***","no"}  Family History: Family History  Problem Relation Age of Onset  . Diabetes Mother   . Hypertension Mother   . Hyperlipidemia Mother   . Diabetes Father   . Hypertension Father   . Diabetes Sister   . Hypertension Sister   . Diabetes Brother   . Hypertension Brother    Problem                               Relation Asthma                                   *** Eczema                                *** Food allergy                          *** Allergic rhino conjunctivitis     ***  Review of Systems  Constitutional: Negative for appetite change, chills, fever and unexpected weight change.  HENT: Negative for congestion and rhinorrhea.   Eyes: Negative for itching.  Respiratory: Negative for cough, chest tightness, shortness of breath and wheezing.   Cardiovascular: Negative for chest pain.  Gastrointestinal: Negative for abdominal pain.  Genitourinary: Negative for difficulty urinating.   Skin: Negative for rash.  Allergic/Immunologic: Negative for environmental allergies and food allergies.  Neurological: Negative for headaches.   Objective: There were no vitals taken for this visit. There is no height or weight on file to calculate BMI. Physical Exam  Constitutional: He is oriented to person, place, and time. He appears well-developed and well-nourished.  HENT:  Head: Normocephalic and atraumatic.  Right Ear: External ear normal.  Left Ear: External ear normal.  Nose: Nose normal.  Mouth/Throat: Oropharynx is clear and moist.  Eyes: Conjunctivae and EOM are normal.  Neck: Neck supple.  Cardiovascular: Normal rate, regular rhythm and normal heart sounds. Exam reveals no gallop and no friction rub.  No murmur heard. Pulmonary/Chest: Effort normal and breath sounds normal. He has no wheezes. He has no rales.  Abdominal: Soft.  Neurological: He is alert and oriented to person, place, and time.  Skin: Skin is warm. No rash noted.  Psychiatric: He has a normal mood and affect. His behavior is normal.  Nursing note and vitals reviewed.  The plan was reviewed with the patient/family, and all questions/concerned were addressed.  It was my pleasure to see Cable today and participate in his care. Please feel free to contact me with any questions or concerns.  Sincerely,  Rexene Alberts, DO Allergy & Immunology  [facility] office: [phone] [facility] office: [place] office: [phone]

## 2019-08-18 ENCOUNTER — Ambulatory Visit: Payer: Self-pay | Admitting: Allergy

## 2019-08-24 ENCOUNTER — Ambulatory Visit (INDEPENDENT_AMBULATORY_CARE_PROVIDER_SITE_OTHER): Payer: Medicare Other | Admitting: Allergy and Immunology

## 2019-08-24 ENCOUNTER — Other Ambulatory Visit: Payer: Self-pay

## 2019-08-24 ENCOUNTER — Encounter: Payer: Self-pay | Admitting: Allergy and Immunology

## 2019-08-24 VITALS — BP 122/90 | HR 42 | Temp 97.3°F | Resp 18 | Ht 60.5 in | Wt 228.6 lb

## 2019-08-24 DIAGNOSIS — J45901 Unspecified asthma with (acute) exacerbation: Secondary | ICD-10-CM | POA: Diagnosis not present

## 2019-08-24 DIAGNOSIS — R001 Bradycardia, unspecified: Secondary | ICD-10-CM | POA: Diagnosis not present

## 2019-08-24 DIAGNOSIS — J3089 Other allergic rhinitis: Secondary | ICD-10-CM | POA: Diagnosis not present

## 2019-08-24 MED ORDER — SPIRIVA RESPIMAT 1.25 MCG/ACT IN AERS: 2.0000 | INHALATION_SPRAY | Freq: Every day | RESPIRATORY_TRACT | 5 refills | Status: DC

## 2019-08-24 MED ORDER — FLOVENT HFA 220 MCG/ACT IN AERO: 2.0000 | INHALATION_SPRAY | Freq: Two times a day (BID) | RESPIRATORY_TRACT | 5 refills | Status: DC

## 2019-08-24 NOTE — Assessment & Plan Note (Addendum)
   Prednisone taper will be extended, 40 mg x3 days, 20 mg x3 days, 10 mg x3 days, then stop.  A prescription has been provided for Flovent (fluticasone) 220 g, 2 inhalations twice a day. To maximize pulmonary deposition, a spacer has been provided along with instructions for its proper administration with an HFA inhaler.  A prescription has been provided for Spiriva Respimat 1.25 g, 2 inhalations daily.  Continue albuterol every 4-6 hours if needed.  The patient has been asked to contact me if his symptoms persist or progress. Otherwise, he may return for follow up in 2 weeks.

## 2019-08-24 NOTE — Assessment & Plan Note (Signed)
   No skin testing was performed today due to poor pulmonary function.  He may return in the future for allergy skin testing when asthma is under better control.  Further recommendations will be made based upon skin test results.

## 2019-08-24 NOTE — Patient Instructions (Addendum)
Asthma exacerbation  Prednisone taper will be extended, 40 mg x3 days, 20 mg x3 days, 10 mg x3 days, then stop.  A prescription has been provided for Flovent (fluticasone) 220 g, 2 inhalations twice a day. To maximize pulmonary deposition, a spacer has been provided along with instructions for its proper administration with an HFA inhaler.  A prescription has been provided for Spiriva Respimat 1.25 g, 2 inhalations daily.  Continue albuterol every 4-6 hours if needed.  The patient has been asked to contact me if his symptoms persist or progress. Otherwise, he may return for follow up in 2 weeks.  Bradycardia Patient's heart rate is in the low 40s, despite labored breathing secondary to asthma exacerbation, in the context of lower extremity pitting edema.  Cardiac evaluation is warranted.  The patient has refused transportation by ambulance stating that he would prefer to walk across the street to [facility]. The importance of going straight to the ER has been emphasized and the patient has verbalized understanding and agreed to do so.  The emergency room physician has been contacted and informed of Mr. Giles's pertinent medical information and his stated intent to be evaluated in the emergency department.  Other allergic rhinitis  No skin testing was performed today due to poor pulmonary function.  He may return in the future for allergy skin testing when asthma is under better control.  Further recommendations will be made based upon skin test results.   Return in about 2 weeks (around 09/07/2019), or if symptoms worsen or fail to improve.

## 2019-08-24 NOTE — Assessment & Plan Note (Addendum)
Patient's heart rate is in the low 40s, despite labored breathing secondary to asthma exacerbation, in the context of lower extremity pitting edema.  Cardiac evaluation is warranted.  The patient has refused transportation by ambulance stating that he would prefer to walk across the street to [facility]. The importance of going straight to the ER has been emphasized and the patient has verbalized understanding and agreed to do so.  The emergency room physician has been contacted and informed of Mr. Haring's pertinent medical information and his stated intent to be evaluated in the emergency department.

## 2019-08-24 NOTE — Progress Notes (Signed)
New Patient Note  RE: Gary Buchanan MRN: [id number] DOB: 1966/02/19 Date of Office Visit: 08/24/2019  Referring provider: Ronnald Nian, DO Primary care provider: Ronnald Nian, DO  Chief Complaint: Asthma and Allergic Rhinitis   History of present illness: Gary Buchanan is a 53 y.o. male seen today in consultation requested by Letta Median, DO.  Mr. Senn is a poor historian.  He complains of frequent dyspnea, chest tightness, coughing, and wheezing.  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 Has states that he had been prescribed budesonide 0.5 mg via nebulizer, however has not been taking asthma medications over the past 4 months for unclear reasons.  He reports that he was diagnosed with atrial fibrillation in July.  He has a history of allergic rhinitis and apparently underwent immunotherapy injections in 2009 and 2011.  It is unclear if he made it to maintenance therapy.  He experiences frequent nasal congestion. history of nasal polyposis and polypectomy.  Assessment and plan: Asthma exacerbation  Prednisone taper will be extended, 40 mg x3 days, 20 mg x3 days, 10 mg x3 days, then stop.  A prescription has been provided for Flovent (fluticasone) 220 g, 2 inhalations twice a day. To maximize pulmonary deposition, a spacer has been provided along with instructions for its proper administration with an HFA inhaler.  A prescription has been provided for Spiriva Respimat 1.25 g, 2 inhalations daily.  Continue albuterol every 4-6 hours if needed.  The patient has been asked to contact me if his symptoms persist or progress. Otherwise, he may return for follow up in 2 weeks.  Bradycardia Patient's heart rate is in the low 40s, despite labored breathing secondary to asthma exacerbation, in the context of lower extremity pitting edema.  Cardiac evaluation is warranted.  The patient has refused transportation by  ambulance stating that he would prefer to walk across the street to [facility]. The importance of going straight to the ER has been emphasized and the patient has verbalized understanding and agreed to do so.  The emergency room physician has been contacted and informed of Mr. Pavlich's pertinent medical information and his stated intent to be evaluated in the emergency department.  Other allergic rhinitis  No skin testing was performed today due to poor pulmonary function.  He may return in the future for allergy skin testing when asthma is under better control.  Further recommendations will be made based upon skin test results.   Meds ordered this encounter  Medications  . fluticasone (FLOVENT HFA) 220 MCG/ACT inhaler    Sig: Inhale 2 puffs into the lungs 2 (two) times daily.    Dispense:  1 Inhaler    Refill:  5  . Tiotropium Bromide Monohydrate (SPIRIVA RESPIMAT) 1.25 MCG/ACT AERS    Sig: Inhale 2 puffs into the lungs daily.    Dispense:  4 g    Refill:  5    Diagnostics: Spirometry: FVC was 1.48 L (59% predicted) and FEV1 was 0.81 L (43% predicted) with significant (230 mL, 28%) postbronchodilator improvement.  Please see scanned spirometry results for details. Allergy skin testing: Not performed due to poor lung function.   Physical examination: Blood pressure 122/90, pulse (!) 42, temperature (!) 97.3 F (36.3 C), temperature source Temporal, resp. rate 18, height 5' 0.5" (1.537 m), weight 228 lb 9.6 oz (103.7 kg), SpO2 94 %.  General: Alert, interactive, in no acute  distress. HEENT: TMs pearly gray, turbinates moderately edematous with thick discharge, post-pharynx moderately erythematous. Neck: Supple without lymphadenopathy. Lungs: Mildly decreased breath sounds with expiratory wheezing bilaterally. CV: Normal S1, S2 without murmurs. Abdomen: Nondistended, nontender. Skin: Warm and dry, without lesions or rashes. Extremities: 3+ pitting edema  Neuro:   Grossly intact.  Review of systems:  Review of systems negative except as noted in HPI / PMHx or noted below: Review of Systems  Constitutional: Negative.   HENT: Negative.   Eyes: Negative.   Respiratory: Negative.   Cardiovascular: Negative.   Gastrointestinal: Negative.   Genitourinary: Negative.   Musculoskeletal: Negative.   Skin: Negative.   Neurological: Negative.   Endo/Heme/Allergies: Negative.   Psychiatric/Behavioral: Negative.     Past medical history:  Past Medical History:  Diagnosis Date  . A-fib ([place])   . Asthma   . Blindness of left eye   . Chronic kidney disease   . Diabetes mellitus without complication ([place])   . Gynecomastia   . Hypertension   . Left knee pain     Past surgical history:  Past Surgical History:  Procedure Laterality Date  . ADRENALECTOMY    . EYE SURGERY    . HERNIA REPAIR    . SINOSCOPY    . SINUS ENDO WITH FUSION  10/17/2012   Procedure: SINUS ENDO WITH FUSION;  Surgeon: Ascencion Dike, MD;  Location: [facility];  Service: ENT;  Laterality: Bilateral;  Bilateral Ethmoidectomy,  Bilateral Maxillary Antrostomy, Bilateral Frontal Recess Exploration,   W/ Fusion Protocol    Family history: Family History  Problem Relation Age of Onset  . Diabetes Mother   . Hypertension Mother   . Hyperlipidemia Mother   . Diabetes Father   . Hypertension Father   . Diabetes Sister   . Hypertension Sister   . Diabetes Brother   . Hypertension Brother   . Allergic rhinitis Neg Hx   . Asthma Neg Hx   . Eczema Neg Hx   . Urticaria Neg Hx     Social history: Social History   Socioeconomic History  . Marital status: Single    Spouse name: Not on file  . Number of children: Not on file  . Years of education: Not on file  . Highest education level: Not on file  Occupational History  . Not on file  Social Needs  . Financial resource strain: Not on file  . Food insecurity    Worry: Not on file    Inability: Not  on file  . Transportation needs    Medical: Not on file    Non-medical: Not on file  Tobacco Use  . Smoking status: Never Smoker  . Smokeless tobacco: Never Used  Substance and Sexual Activity  . Alcohol use: No  . Drug use: No  . Sexual activity: Not Currently    Birth control/protection: Abstinence  Lifestyle  . Physical activity    Days per week: Not on file    Minutes per session: Not on file  . Stress: Not on file  Relationships  . Social Herbalist on phone: Not on file    Gets together: Not on file    Attends religious service: Not on file    Active member of club or organization: Not on file    Attends meetings of clubs or organizations: Not on file    Relationship status: Not on file  . Intimate partner violence    Fear of current  or ex partner: Not on file    Emotionally abused: Not on file    Physically abused: Not on file    Forced sexual activity: Not on file  Other Topics Concern  . Not on file  Social History Narrative  . Not on file   Environmental History: The patient lives in an apartment with carpeting throughout, central heat, and window air conditioning units.  There is mold/water damage in the home.  There are no pets in the home.  He is a never-smoker.  Allergies as of 08/24/2019      Reactions   Latex Itching   Lisinopril Swelling   Other reaction(s): Other Lip swelling   Ace Inhibitors Swelling   Lips swells   Gabapentin Other (See Comments)   Other reaction(s): Other (See Comments) Rapid heartbeat  Other reaction(s): Other Rapid heartbeat  Other reaction(s): Other (See Comments) Rapid heartbeat  Rapid heartbeat    Tizanidine Other (See Comments)   Other reaction(s): Other chest pain  Other reaction(s): Other (See Comments) Chest pain Other reaction(s): Other (See Comments) Chest pain chest pain       Medication List       Accurate as of August 24, 2019  8:45 PM. If you have any questions, ask your nurse or doctor.         STOP taking these medications   fluticasone 110 MCG/ACT inhaler Commonly known as: Flovent HFA Replaced by: Flovent HFA 220 MCG/ACT inhaler Stopped by: Edmonia Lynch, MD     TAKE these medications   Accu-Chek Aviva Plus w/Device Kit USE DAILY AS DIRECTED   albuterol 108 (90 Base) MCG/ACT inhaler Commonly known as: VENTOLIN HFA Inhale 2 puffs into the lungs every 6 (six) hours as needed.   allopurinol 300 MG tablet Commonly known as: ZYLOPRIM Take 1 tablet (300 mg total) by mouth daily.   amLODipine 10 MG tablet Commonly known as: NORVASC Take 1 tablet (10 mg total) by mouth daily.   aspirin EC 81 MG tablet Take 81 mg by mouth daily.   Brinzolamide-Brimonidine 1-0.2 % Susp Place 1 drop into the right eye 3 (three) times daily.   cefdinir 300 MG capsule Commonly known as: OMNICEF TAKE 1 CAPSULE (300 MG TOTAL) BY MOUTH EVERY 12 HOURS FOR 7 DAYS.   diclofenac sodium 1 % Gel Commonly known as: VOLTAREN Apply 2 g topically 4 (four) times daily.   ferrous sulfate 325 (65 FE) MG tablet Take 325 mg by mouth daily with breakfast.   Flovent HFA 220 MCG/ACT inhaler Generic drug: fluticasone Inhale 2 puffs into the lungs 2 (two) times daily. Replaces: fluticasone 110 MCG/ACT inhaler Started by: Edmonia Lynch, MD   fluticasone 50 MCG/ACT nasal spray Commonly known as: FLONASE Place 2 sprays into both nostrils daily.   folic acid 056 MCG tablet Commonly known as: FOLVITE Take 400 mcg by mouth daily.   furosemide 20 MG tablet Commonly known as: LASIX TAKE 1 TABLET BY MOUTH EVERY MORNING FOR BLOOD PRESSURE AND FLUID RETENTION.   ipratropium-albuterol 0.5-2.5 (3) MG/3ML Soln Commonly known as: DUONEB Take 3 mLs by nebulization every 6 (six) hours as needed.   loratadine 10 MG tablet Commonly known as: CLARITIN TAKE 1 TABLET BY MOUTH EVERY DAY   losartan 50 MG tablet Commonly known as: COZAAR Take 1 tablet (50 mg total) by mouth daily.   losartan 25 MG  tablet Commonly known as: COZAAR Take 50 mg by mouth daily.   magnesium gluconate 500 MG tablet Commonly  known as: MAGONATE Take 1 tablet (500 mg total) by mouth daily.   montelukast 10 MG tablet Commonly known as: SINGULAIR Take 10 mg by mouth daily.   predniSONE 20 MG tablet Commonly known as: DELTASONE 3 tabs po x 3 days, 2 tabs po x 3 days, 1 tab po x 3 days, 1/2 tab po x 3 days   pyridOXINE 100 MG tablet Commonly known as: VITAMIN B-6 Take 100 mg by mouth daily.   SENTRY SENIOR PO Take 1 tablet by mouth daily.   sitaGLIPtin 50 MG tablet Commonly known as: Januvia Take 1 tablet (50 mg total) by mouth daily.   Spiriva Respimat 1.25 MCG/ACT Aers Generic drug: Tiotropium Bromide Monohydrate Inhale 2 puffs into the lungs daily. Started by: Edmonia Lynch, MD   timolol 0.25 % ophthalmic solution Commonly known as: BETIMOL Place 1-2 drops into the right eye 2 (two) times daily.   timolol 0.25 % ophthalmic solution Commonly known as: TIMOPTIC Place 1 drop into both eyes daily.   travoprost (benzalkonium) 0.004 % ophthalmic solution Commonly known as: TRAVATAN Place 1 drop into the right eye at bedtime.   valACYclovir 1000 MG tablet Commonly known as: VALTREX Take 1 tablet (1,000 mg total) by mouth daily.   vitamin E 200 UNIT capsule Generic drug: vitamin E Take 200 Units by mouth daily.       Known medication allergies: Allergies  Allergen Reactions  . Latex Itching  . Lisinopril Swelling    Other reaction(s): Other Lip swelling  . Ace Inhibitors Swelling    Lips swells  . Gabapentin Other (See Comments)    Other reaction(s): Other (See Comments) Rapid heartbeat  Other reaction(s): Other Rapid heartbeat  Other reaction(s): Other (See Comments) Rapid heartbeat  Rapid heartbeat   . Tizanidine Other (See Comments)    Other reaction(s): Other chest pain  Other reaction(s): Other (See Comments) Chest pain Other reaction(s): Other (See Comments)  Chest pain chest pain     I appreciate the opportunity to take part in Kable's care. Please do not hesitate to contact me with questions.  Sincerely,   R. Edgar Frisk, MD

## 2019-08-25 ENCOUNTER — Emergency Department (HOSPITAL_COMMUNITY): Payer: Medicare Other

## 2019-08-25 ENCOUNTER — Other Ambulatory Visit: Payer: Self-pay

## 2019-08-25 ENCOUNTER — Encounter (HOSPITAL_COMMUNITY): Payer: Self-pay | Admitting: Emergency Medicine

## 2019-08-25 ENCOUNTER — Telehealth: Payer: Self-pay | Admitting: *Deleted

## 2019-08-25 ENCOUNTER — Emergency Department (HOSPITAL_COMMUNITY)
Admission: EM | Admit: 2019-08-25 | Discharge: 2019-08-25 | Disposition: A | Discharge status: Home / Self Care | Payer: Medicare Other | Attending: Emergency Medicine | Admitting: Emergency Medicine

## 2019-08-25 DIAGNOSIS — R0602 Shortness of breath: Secondary | ICD-10-CM | POA: Diagnosis present

## 2019-08-25 DIAGNOSIS — I130 Hypertensive heart and chronic kidney disease with heart failure and stage 1 through stage 4 chronic kidney disease, or unspecified chronic kidney disease: Secondary | ICD-10-CM | POA: Diagnosis not present

## 2019-08-25 DIAGNOSIS — I509 Heart failure, unspecified: Secondary | ICD-10-CM | POA: Diagnosis not present

## 2019-08-25 DIAGNOSIS — Z91040 Latex allergy status: Secondary | ICD-10-CM | POA: Insufficient documentation

## 2019-08-25 DIAGNOSIS — E1122 Type 2 diabetes mellitus with diabetic chronic kidney disease: Secondary | ICD-10-CM | POA: Insufficient documentation

## 2019-08-25 DIAGNOSIS — Z7984 Long term (current) use of oral hypoglycemic drugs: Secondary | ICD-10-CM | POA: Diagnosis not present

## 2019-08-25 DIAGNOSIS — J4541 Moderate persistent asthma with (acute) exacerbation: Secondary | ICD-10-CM | POA: Insufficient documentation

## 2019-08-25 DIAGNOSIS — Z7982 Long term (current) use of aspirin: Secondary | ICD-10-CM | POA: Insufficient documentation

## 2019-08-25 DIAGNOSIS — Z79899 Other long term (current) drug therapy: Secondary | ICD-10-CM | POA: Diagnosis not present

## 2019-08-25 DIAGNOSIS — N183 Chronic kidney disease, stage 3 (moderate): Secondary | ICD-10-CM | POA: Diagnosis not present

## 2019-08-25 LAB — BASIC METABOLIC PANEL
Anion gap: 9 (ref 5–15)
BUN: 36 mg/dL — ABNORMAL HIGH (ref 6–20)
CO2: 26 mmol/L (ref 22–32)
Calcium: 10.1 mg/dL (ref 8.9–10.3)
Chloride: 104 mmol/L (ref 98–111)
Creatinine, Ser: 1.8 mg/dL — ABNORMAL HIGH (ref 0.61–1.24)
GFR calc Af Amer: 48 mL/min — ABNORMAL LOW (ref >60)
GFR calc non Af Amer: 41 mL/min — ABNORMAL LOW (ref >60)
Glucose, Bld: 182 mg/dL — ABNORMAL HIGH (ref 70–99)
Potassium: 4.2 mmol/L (ref 3.5–5.1)
Sodium: 139 mmol/L (ref 135–145)

## 2019-08-25 LAB — CBC WITH DIFFERENTIAL/PLATELET
Abs Immature Granulocytes: 0.38 K/uL — ABNORMAL HIGH (ref 0.00–0.07)
Basophils Absolute: 0 K/uL (ref 0.0–0.1)
Basophils Relative: 0 %
Eosinophils Absolute: 0 K/uL (ref 0.0–0.5)
Eosinophils Relative: 0 %
HCT: 41 % (ref 39.0–52.0)
Hemoglobin: 13.2 g/dL (ref 13.0–17.0)
Immature Granulocytes: 3 %
Lymphocytes Relative: 9 %
Lymphs Abs: 1.4 K/uL (ref 0.7–4.0)
MCH: 27.7 pg (ref 26.0–34.0)
MCHC: 32.2 g/dL (ref 30.0–36.0)
MCV: 86.1 fL (ref 80.0–100.0)
Monocytes Absolute: 0.8 K/uL (ref 0.1–1.0)
Monocytes Relative: 5 %
Neutro Abs: 12.5 K/uL — ABNORMAL HIGH (ref 1.7–7.7)
Neutrophils Relative %: 83 %
Platelets: 292 K/uL (ref 150–400)
RBC: 4.76 MIL/uL (ref 4.22–5.81)
RDW: 17.5 % — ABNORMAL HIGH (ref 11.5–15.5)
WBC: 15.1 K/uL — ABNORMAL HIGH (ref 4.0–10.5)
nRBC: 0 % (ref 0.0–0.2)

## 2019-08-25 LAB — TROPONIN I (HIGH SENSITIVITY): Troponin I (High Sensitivity): 20 ng/L — ABNORMAL HIGH (ref <18)

## 2019-08-25 LAB — BRAIN NATRIURETIC PEPTIDE: B Natriuretic Peptide: 60.2 pg/mL (ref 0.0–100.0)

## 2019-08-25 MED ORDER — PREDNISONE 20 MG PO TABS: ORAL_TABLET | ORAL | 0 refills | Status: DC

## 2019-08-25 MED ORDER — METHYLPREDNISOLONE SODIUM SUCC 125 MG IJ SOLR
125.0000 mg | Freq: Once | INTRAMUSCULAR | Status: AC
  Administered 2019-08-25: 125 mg via INTRAVENOUS
  Filled 2019-08-25: qty 2

## 2019-08-25 MED ORDER — IPRATROPIUM BROMIDE HFA 17 MCG/ACT IN AERS
2.0000 | INHALATION_SPRAY | Freq: Once | RESPIRATORY_TRACT | Status: DC
  Filled 2019-08-25: qty 12.9

## 2019-08-25 MED ORDER — MAGNESIUM SULFATE 2 GM/50ML IV SOLN
2.0000 g | Freq: Once | INTRAVENOUS | Status: AC
  Administered 2019-08-25: 2 g via INTRAVENOUS
  Filled 2019-08-25: qty 50

## 2019-08-25 MED ORDER — ALBUTEROL SULFATE HFA 108 (90 BASE) MCG/ACT IN AERS
8.0000 | INHALATION_SPRAY | Freq: Once | RESPIRATORY_TRACT | Status: AC
  Administered 2019-08-25: 8 via RESPIRATORY_TRACT
  Filled 2019-08-25: qty 6.7

## 2019-08-25 NOTE — Discharge Instructions (Signed)
Use your albuterol every 4 hours.  You are being prescribed a prednisone taper.  Follow-up closely with your primary care physician.  If you develop chest pain, coughing up blood, fever, worsening shortness of breath, or any other new/concerning symptoms then return to the ER for evaluation.

## 2019-08-25 NOTE — ED Provider Notes (Signed)
[facility] EMERGENCY DEPARTMENT Provider Note   CSN: [id number] Arrival date & time: 08/25/19  1501     History   Chief Complaint Chief Complaint  Patient presents with  . Shortness of Breath    HPI Gary Buchanan is a 53 y.o. male.     HPI  53-year-old male presents with shortness of breath.  He has had this for quite some time, at least several months.  Seems to wax and wane but is worse recently.  No significant cough.  No fever or chest pain.  He does have chronic arrhythmia but he is not complaining of palpitations today.  He states that he thinks the albuterol nebulized medicine he was given is making his shortness of breath worse.  He has chronic lower extremity edema that is unchanged. He just finished a 5 day course of steroids, took 10 mg today.  Past Medical History:  Diagnosis Date  . A-fib (HCC)   . Asthma   . Blindness of left eye   . Chronic kidney disease   . Diabetes mellitus without complication (HCC)   . Gynecomastia   . Hypertension   . Left knee pain     Patient Active Problem List   Diagnosis Date Noted  . Bradycardia 08/24/2019  . Other allergic rhinitis 08/24/2019  . Non-compliance 04/26/2019  . Acute on chronic respiratory failure with hypoxia and hypercapnia (HCC) 05/31/2018  . Asthma exacerbation 11/09/2017  . Chest pain 10/11/2017  . CHF (congestive heart failure) (HCC) 10/11/2017  . Bilateral chronic serous otitis media 09/23/2017  . SOB (shortness of breath) 07/15/2017  . Asthma 06/21/2017  . Blindness of left eye 06/21/2017  . New onset a-fib (HCC) 06/21/2017  . Type 2 diabetes mellitus with complication, without long-term current use of insulin (HCC) 06/21/2017  . Hypertension 06/21/2017  . Obstructive sleep apnea syndrome 06/21/2017  . 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 Hypertensive retinopathy 01/28/2016  .  Nuclear sclerotic cataract 01/28/2016  . Presbyopia 01/28/2016  . Ptosis of eyelid 01/28/2016  . Mood disorder (HCC) 01/07/2015  . Abnormal weight gain 12/11/2014  . Hypercalcemia 12/11/2014  . Muscle cramping 11/19/2014  . Hyperprolactinemia (HCC) 10/10/2014  . Male hypogonadism 10/10/2014  . Chronic gout due to renal impairment, left ankle and foot, without tophus (tophi) 10/02/2014  . Chronic gout of left foot due to renal impairment 10/02/2014  . Chronic gout of right foot due to renal impairment 10/02/2014  . Adrenal adenoma 07/23/2014  . Gynecomastia 01/09/2014  . Arthritis, multiple joint involvement 11/10/2013  . Disorder of joint 11/10/2013  . Anemia due to chronic kidney disease 01/31/2013  . Chronic kidney disease, stage III (moderate) (HCC) 01/31/2013  . Disorder of optic nerve 01/27/2013  . Glaucoma suspect 01/27/2013  . Nuclear senile cataract 01/27/2013  . Optic atrophy 01/27/2013  . 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  . 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 Senile nuclear sclerosis 01/27/2013  . Herpes dermatitis 11/30/2012  . Herpes simplex complications 11/30/2012  . Herpes simplex with complication 11/30/2012  . Leg cramps 11/30/2012  . Blepharitis 09/15/2012  . Impairment level: one eye: total impairment: other eye: not specified 09/15/2012  . Myogenic ptosis of eyelid 09/15/2012  . Ptosis, myogenic 09/15/2012  . Total impairment of one eye 09/15/2012  . Visual loss, one eye, no light perception (NLP) 09/15/2012  . Conn syndrome (HCC)   08/19/2012  . 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[place]) 08/19/2012  . 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 Chronic sinusitis 07/06/2012  . Hypokalemia 07/06/2012  . Hyperaldosteronism ([place]) 04/28/2012  . Morbid obesity ([place]) 04/25/2012    Past Surgical History:  Procedure Laterality Date  . ADRENALECTOMY    . EYE SURGERY    . HERNIA REPAIR    . SINOSCOPY    . SINUS ENDO WITH FUSION  10/17/2012   Procedure: SINUS ENDO WITH  FUSION;  Surgeon: Ascencion Dike, MD;  Location: [facility];  Service: ENT;  Laterality: Bilateral;  Bilateral Ethmoidectomy,  Bilateral Maxillary Antrostomy, Bilateral Frontal Recess Exploration,   W/ Fusion Protocol        Home Medications    Prior to Admission medications   Medication Sig Start Date End Date Taking? Authorizing Provider  albuterol (PROVENTIL HFA;VENTOLIN HFA) 108 (90 Base) MCG/ACT inhaler Inhale 2 puffs into the lungs every 6 (six) hours as needed. 03/01/19  Yes Cirigliano, Mary K, DO  allopurinol (ZYLOPRIM) 300 MG tablet Take 1 tablet (300 mg total) by mouth daily. 12/09/18  Yes Cirigliano, Mary K, DO  amLODipine (NORVASC) 10 MG tablet Take 1 tablet (10 mg total) by mouth daily. 04/26/19  Yes Cirigliano, Mary K, DO  aspirin EC 81 MG tablet Take 81 mg by mouth daily.   Yes [provider]  Blood Glucose Monitoring Suppl (ACCU-CHEK AVIVA PLUS) w/Device KIT USE DAILY AS DIRECTED 12/17/17  Yes Mayo, Pete Pelt, MD  Brinzolamide-Brimonidine 1-0.2 % SUSP Place 1 drop into the right eye 3 (three) times daily.   Yes [provider]  diclofenac sodium (VOLTAREN) 1 % GEL Apply 2 g topically 4 (four) times daily. 05/19/19  Yes Cirigliano, Mary K, DO  ferrous sulfate 325 (65 FE) MG tablet Take 325 mg by mouth daily with breakfast.   Yes [provider]  fluticasone (FLONASE) 50 MCG/ACT nasal spray Place 2 sprays into both nostrils daily. 04/26/19  Yes Cirigliano, Mary K, DO  fluticasone (FLOVENT HFA) 220 MCG/ACT inhaler Inhale 2 puffs into the lungs 2 (two) times daily. 08/24/19  Yes Bobbitt, [place] Muta, MD  folic acid (FOLVITE) 209 MCG tablet Take 400 mcg by mouth daily.   Yes [provider]  furosemide (LASIX) 20 MG tablet TAKE 1 TABLET BY MOUTH EVERY MORNING FOR BLOOD PRESSURE AND FLUID RETENTION. Patient taking differently: Take 20 mg by mouth daily. FOR BLOOD PRESSURE AND FLUID RETENTION. 12/09/18  Yes Cirigliano, Mary K, DO   ipratropium-albuterol (DUONEB) 0.5-2.5 (3) MG/3ML SOLN Take 3 mLs by nebulization every 6 (six) hours as needed. 04/26/19  Yes Cirigliano, Mary K, DO  loratadine (CLARITIN) 10 MG tablet TAKE 1 TABLET BY MOUTH EVERY DAY Patient taking differently: Take 10 mg by mouth daily.  05/10/19  Yes Cirigliano, Mary K, DO  losartan (COZAAR) 50 MG tablet Take 1 tablet (50 mg total) by mouth daily. 05/05/19  Yes Cirigliano, Mary K, DO  magnesium gluconate (MAGONATE) 500 MG tablet Take 1 tablet (500 mg total) by mouth daily. 12/09/18  Yes Cirigliano, Mary K, DO  montelukast (SINGULAIR) 10 MG tablet Take 10 mg by mouth daily. 07/03/19  Yes [provider]  Multiple Vitamins-Minerals (SENTRY SENIOR PO) Take 1 tablet by mouth daily.   Yes [provider]  pyridOXINE (VITAMIN B-6) 100 MG tablet Take 100 mg by mouth daily.   Yes [provider]  sitaGLIPtin (JANUVIA) 50 MG tablet Take 1 tablet (50 mg total) by mouth daily. 04/26/19  Yes Cirigliano,  Mary K, DO  timolol (BETIMOL) 0.25 % ophthalmic solution Place 1-2 drops into the right eye 2 (two) times daily.   Yes [provider]  Tiotropium Bromide Monohydrate (SPIRIVA RESPIMAT) 1.25 MCG/ACT AERS Inhale 2 puffs into the lungs daily. 08/24/19  Yes Bobbitt, Ralph Carter, MD  valACYclovir (VALTREX) 1000 MG tablet Take 1 tablet (1,000 mg total) by mouth daily. 04/26/19  Yes Cirigliano, Mary K, DO  vitamin E (VITAMIN E) 200 UNIT capsule Take 200 Units by mouth daily.   Yes [provider]  predniSONE (DELTASONE) 20 MG tablet 3 tabs po daily x 3 days, then 2 tabs x 3 days, then 1.5 tabs x 3 days, then 1 tab x 3 days, then 0.5 tabs x 3 days 08/25/19   Narda Fundora, MD    Family History Family History  Problem Relation Age of Onset  . Diabetes Mother   . Hypertension Mother   . Hyperlipidemia Mother   . Diabetes Father   . Hypertension Father   . Diabetes Sister   . Hypertension Sister   . Diabetes Brother   . Hypertension  Brother   . Allergic rhinitis Neg Hx   . Asthma Neg Hx   . Eczema Neg Hx   . Urticaria Neg Hx     Social History Social History   Tobacco Use  . Smoking status: Never Smoker  . Smokeless tobacco: Never Used  Substance Use Topics  . Alcohol use: No  . Drug use: No     Allergies   Latex, Lisinopril, Ace inhibitors, Gabapentin, and Tizanidine   Review of Systems Review of Systems  Constitutional: Negative for fever.  Respiratory: Positive for shortness of breath. Negative for cough.   Cardiovascular: Positive for leg swelling. Negative for chest pain and palpitations.  All other systems reviewed and are negative.    Physical Exam Updated Vital Signs BP 130/81   Pulse (!) 57   Temp 98.6 F (37 C) (Oral)   Resp (!) 23   Ht 5' 1" (1.549 m)   Wt 104.3 kg   SpO2 97%   BMI 43.46 kg/m   Physical Exam Vitals signs and nursing note reviewed.  Constitutional:      Appearance: He is well-developed. He is obese.  HENT:     Head: Normocephalic and atraumatic.     Right Ear: External ear normal.     Left Ear: External ear normal.     Nose: Nose normal.  Eyes:     General:        Right eye: No discharge.        Left eye: No discharge.  Neck:     Musculoskeletal: Neck supple.  Cardiovascular:     Rate and Rhythm: Normal rate. Rhythm irregular.     Heart sounds: Normal heart sounds.  Pulmonary:     Effort: Pulmonary effort is normal. Tachypnea (mild) present. No accessory muscle usage.     Breath sounds: Wheezing present.  Abdominal:     Palpations: Abdomen is soft.     Tenderness: There is no abdominal tenderness.  Skin:    General: Skin is warm and dry.  Neurological:     Mental Status: He is alert.  Psychiatric:        Mood and Affect: Mood is not anxious.      ED Treatments / Results  Labs (all labs ordered are listed, but only abnormal results are displayed) Labs Reviewed  CBC WITH DIFFERENTIAL/PLATELET - Abnormal; Notable for the following    components:      Result Value   WBC 15.1 (*)    RDW 17.5 (*)    Neutro Abs 12.5 (*)    Abs Immature Granulocytes 0.38 (*)    All other components within normal limits  BASIC METABOLIC PANEL - Abnormal; Notable for the following components:   Glucose, Bld 182 (*)    BUN 36 (*)    Creatinine, Ser 1.80 (*)    GFR calc non Af Amer 41 (*)    GFR calc Af Amer 48 (*)    All other components within normal limits  TROPONIN I (HIGH SENSITIVITY) - Abnormal; Notable for the following components:   Troponin I (High Sensitivity) 20 (*)    All other components within normal limits  BRAIN NATRIURETIC PEPTIDE  TROPONIN I (HIGH SENSITIVITY)    EKG EKG Interpretation  Date/Time:  Friday August 25 2019 15:12:33 EDT Ventricular Rate:  96 PR Interval:  136 QRS Duration: 86 QT Interval:  316 QTC Calculation: 399 R Axis:   82 Text Interpretation:  Sinus rhythm with Premature atrial complexes in a pattern of bigeminy T wave abnormality, consider inferior ischemia Abnormal ECG T wave changes seem new compared to 2019 though similar to 2012 Confirmed by Sherwood Gambler [phone]) on 08/25/2019 5:34:55 PM   Radiology Dg Chest 2 View  Result Date: 08/25/2019 CLINICAL DATA:  Shortness of breath EXAM: CHEST - 2 VIEW COMPARISON:  07/21/2019 FINDINGS: The heart size and mediastinal contours are mildly enlarged, stable. Both lungs are clear. The visualized skeletal structures are unremarkable. IMPRESSION: No active cardiopulmonary disease. Electronically Signed   By: Davina Poke M.D.   On: 08/25/2019 16:03    Procedures Procedures (including critical care time)  Medications Ordered in ED Medications  ipratropium (ATROVENT HFA) inhaler 2 puff (has no administration in time range)  methylPREDNISolone sodium succinate (SOLU-MEDROL) 125 mg/2 mL injection 125 mg (125 mg Intravenous Given 08/25/19 1834)  magnesium sulfate IVPB 2 g 50 mL (0 g Intravenous Stopped 08/25/19 1906)  albuterol (VENTOLIN HFA) 108 (90  Base) MCG/ACT inhaler 8 puff (8 puffs Inhalation Given 08/25/19 1847)     Initial Impression / Assessment and Plan / ED Course  I have reviewed the triage vital signs and the nursing notes.  Pertinent labs & imaging results that were available during my care of the patient were reviewed by me and considered in my medical decision making (see chart for details).        Patient is feeling much better after treatment in the ED.  His shortness of breath is almost undoubtedly from asthma.  His ECG shows some nonspecific T wave inversions which she has had before but not most recently on comparison.  Troponin is 20 which would indicate a repeat.  However the patient would like to go.  My suspicion is based on how long his symptoms have been that this is not ACS, PE, dissection, or other acute emergent condition.  I offered to repeat it but he declines and wants to leave.  I think with the dramatic improvement this sounds like asthma.  Will give a prednisone taper given he just finished prednisone but did not seem to improve.  Return precautions.  Gary Buchanan was evaluated in Emergency Department on 08/25/2019 for the symptoms described in the history of present illness. He was evaluated in the context of the global COVID-19 pandemic, which necessitated consideration that the patient might be at risk for infection with the SARS-CoV-2 virus that causes COVID-19. Institutional  protocols and algorithms that pertain to the evaluation of patients at risk for COVID-19 are in a state of rapid change based on information released by regulatory bodies including the CDC and federal and state organizations. These policies and algorithms were followed during the patient's care in the ED.   Final Clinical Impressions(s) / ED Diagnoses   Final diagnoses:  Moderate persistent asthma with exacerbation    ED Discharge Orders         Ordered    predniSONE (DELTASONE) 20 MG tablet     08/25/19 2002            Brinkley Peet, MD 08/25/19 2023

## 2019-08-25 NOTE — Telephone Encounter (Signed)
Called pt to get an update and he stated he was on the way to the ED. He has someone driving him. He sounded winded and SOB over the phone. Told him to keep f/u apt in 2 weeks.

## 2019-08-25 NOTE — ED Triage Notes (Signed)
Pt to ED with c/o shortness of breath and irreg heart rate.  Pt st's he was at [facility] earlier today but left because he said they were mean to him.  Pt then went to Urgent Care and was told to come here.  Pt denies any chest pain at this time

## 2019-08-25 NOTE — Telephone Encounter (Signed)
-----   Message from Adelina Mings, MD sent at 08/24/2019  8:38 PM EDT ----- Please call the patient to check in for an update.  Thanks.

## 2019-08-28 NOTE — Telephone Encounter (Signed)
Noted. Thanks.

## 2019-09-07 NOTE — Progress Notes (Deleted)
Follow Up Note  RE: Gary Buchanan MRN: [id number] DOB: 20-Jul-1966 Date of Office Visit: 09/08/2019  Referring provider: Ronnald Nian, DO Primary care provider: Patient, No Pcp Per  Chief Complaint: No chief complaint on file.  History of Present Illness:  I had the pleasure of seeing Gary Buchanan for a follow up visit at [facility] on 09/07/2019. He is a 53 y.o. male, who is being followed for asthma, rhinitis. Today he is here for regular follow up visit. His previous allergy office visit was on 08/24/2019 with Dr. Verlin Fester.   Asthma exacerbation  Prednisone taper will be extended, 40 mg x3 days, 20 mg x3 days, 10 mg x3 days, then stop.  A prescription has been provided for Flovent (fluticasone) 220 g, 2 inhalations twice a day. To maximize pulmonary deposition, a spacer has been provided along with instructions for its proper administration with an HFA inhaler.  A prescription has been provided for Spiriva Respimat 1.25 g, 2 inhalations daily.  Continue albuterol every 4-6 hours if needed.  The patient has been asked to contact me if his symptoms persist or progress. Otherwise, he may return for follow up in 2 weeks.  Bradycardia Patient's heart rate is in the low 40s, despite labored breathing secondary to asthma exacerbation, in the context of lower extremity pitting edema.  Cardiac evaluation is warranted.  The patient has refused transportation by ambulance stating that he would prefer to walk across the street to [facility] [place]. The importance of going straight to the ER has been emphasized and the patient has verbalized understanding and agreed to do so.  The emergency room physician has been contacted and informed of Gary Buchanan's pertinent medical information and his stated intent to be evaluated in the emergency department.  Other allergic rhinitis  No skin testing was performed today due to poor pulmonary function.  He  may return in the future for allergy skin testing when asthma is under better control.  Further recommendations will be made based upon skin test results.  Assessment and Plan: Gary Buchanan is a 53 y.o. male with: No problem-specific Assessment & Plan notes found for this encounter.  No follow-ups on file.  No orders of the defined types were placed in this encounter.  Lab Orders  No laboratory test(s) ordered today    Diagnostics: Spirometry:  Tracings reviewed. His effort: {Blank single:19197::"Good reproducible efforts.","It was hard to get consistent efforts and there is a question as to whether this reflects a maximal maneuver.","Poor effort, data can not be interpreted."} FVC: ***L FEV1: ***L, ***% predicted FEV1/FVC ratio: ***% Interpretation: {Blank single:19197::"Spirometry consistent with mild obstructive disease","Spirometry consistent with moderate obstructive disease","Spirometry consistent with severe obstructive disease","Spirometry consistent with possible restrictive disease","Spirometry consistent with mixed obstructive and restrictive disease","Spirometry uninterpretable due to technique","Spirometry consistent with normal pattern","No overt abnormalities noted given today's efforts"}.  Please see scanned spirometry results for details.  Skin Testing: {Blank single:19197::"Select foods","Environmental allergy panel","Environmental allergy panel and select foods","Food allergy panel","None","Deferred due to recent antihistamines use"}. Positive test to: ***. Negative test to: ***.  Results discussed with patient/family.   Medication List:  Current Outpatient Medications  Medication Sig Dispense Refill  . albuterol (PROVENTIL HFA;VENTOLIN HFA) 108 (90 Base) MCG/ACT inhaler Inhale 2 puffs into the lungs every 6 (six) hours as needed. 3 Inhaler 1  . allopurinol (ZYLOPRIM) 300 MG tablet Take 1 tablet (300 mg total) by mouth daily. 90 tablet 3  . amLODipine (NORVASC) 10 MG  tablet Take 1 tablet (10 mg total) by mouth daily. 90 tablet 3  . aspirin EC 81 MG tablet Take 81 mg by mouth daily.    . Blood Glucose Monitoring Suppl (ACCU-CHEK AVIVA PLUS) w/Device KIT USE DAILY AS DIRECTED 1 kit 0  . Brinzolamide-Brimonidine 1-0.2 % SUSP Place 1 drop into the right eye 3 (three) times daily.    . diclofenac sodium (VOLTAREN) 1 % GEL Apply 2 g topically 4 (four) times daily. 100 g 3  . ferrous sulfate 325 (65 FE) MG tablet Take 325 mg by mouth daily with breakfast.    . fluticasone (FLONASE) 50 MCG/ACT nasal spray Place 2 sprays into both nostrils daily. 16 g 5  . fluticasone (FLOVENT HFA) 220 MCG/ACT inhaler Inhale 2 puffs into the lungs 2 (two) times daily. 1 Inhaler 5  . folic acid (FOLVITE) 426 MCG tablet Take 400 mcg by mouth daily.    . furosemide (LASIX) 20 MG tablet TAKE 1 TABLET BY MOUTH EVERY MORNING FOR BLOOD PRESSURE AND FLUID RETENTION. (Patient taking differently: Take 20 mg by mouth daily. FOR BLOOD PRESSURE AND FLUID RETENTION.) 90 tablet 3  . ipratropium-albuterol (DUONEB) 0.5-2.5 (3) MG/3ML SOLN Take 3 mLs by nebulization every 6 (six) hours as needed. 360 mL 5  . loratadine (CLARITIN) 10 MG tablet TAKE 1 TABLET BY MOUTH EVERY DAY (Patient taking differently: Take 10 mg by mouth daily. ) 90 tablet 3  . losartan (COZAAR) 50 MG tablet Take 1 tablet (50 mg total) by mouth daily. 90 tablet 3  . magnesium gluconate (MAGONATE) 500 MG tablet Take 1 tablet (500 mg total) by mouth daily. 90 tablet 3  . montelukast (SINGULAIR) 10 MG tablet Take 10 mg by mouth daily.    . Multiple Vitamins-Minerals (SENTRY SENIOR PO) Take 1 tablet by mouth daily.    . predniSONE (DELTASONE) 20 MG tablet 3 tabs po daily x 3 days, then 2 tabs x 3 days, then 1.5 tabs x 3 days, then 1 tab x 3 days, then 0.5 tabs x 3 days 27 tablet 0  . pyridOXINE (VITAMIN B-6) 100 MG tablet Take 100 mg by mouth daily.    . sitaGLIPtin (JANUVIA) 50 MG tablet Take 1 tablet (50 mg total) by mouth daily. 90  tablet 3  . timolol (BETIMOL) 0.25 % ophthalmic solution Place 1-2 drops into the right eye 2 (two) times daily.    . Tiotropium Bromide Monohydrate (SPIRIVA RESPIMAT) 1.25 MCG/ACT AERS Inhale 2 puffs into the lungs daily. 4 g 5  . valACYclovir (VALTREX) 1000 MG tablet Take 1 tablet (1,000 mg total) by mouth daily. 90 tablet 3  . vitamin E (VITAMIN E) 200 UNIT capsule Take 200 Units by mouth daily.     No current facility-administered medications for this visit.    Allergies: Allergies  Allergen Reactions  . Latex Itching  . Lisinopril Swelling    Other reaction(s): Other Lip swelling  . Ace Inhibitors Swelling    Lips swells  . Gabapentin Other (See Comments)    Other reaction(s): Other (See Comments) Rapid heartbeat  Other reaction(s): Other Rapid heartbeat  Other reaction(s): Other (See Comments) Rapid heartbeat  Rapid heartbeat   . Tizanidine Other (See Comments)    Other reaction(s): Other chest pain  Other reaction(s): Other (See Comments) Chest pain Other reaction(s): Other (See Comments) Chest pain chest pain    I reviewed his past medical history, social history, family history, and environmental history and no significant changes have been reported from  previous visit on 08/24/2019.  Review of Systems  Constitutional: Negative for appetite change, chills, fever and unexpected weight change.  HENT: Negative for congestion and rhinorrhea.   Eyes: Negative for itching.  Respiratory: Negative for cough, chest tightness, shortness of breath and wheezing.   Gastrointestinal: Negative for abdominal pain.  Skin: Negative for rash.  Neurological: Negative for headaches.   Objective: There were no vitals taken for this visit. There is no height or weight on file to calculate BMI. Physical Exam  Constitutional: He is oriented to person, place, and time. He appears well-developed and well-nourished.  HENT:  Head: Normocephalic and atraumatic.  Right Ear: External ear  normal.  Left Ear: External ear normal.  Nose: Nose normal.  Mouth/Throat: Oropharynx is clear and moist.  Eyes: Conjunctivae and EOM are normal.  Neck: Neck supple.  Cardiovascular: Normal rate, regular rhythm and normal heart sounds. Exam reveals no gallop and no friction rub.  No murmur heard. Pulmonary/Chest: Effort normal and breath sounds normal. He has no wheezes. He has no rales.  Neurological: He is alert and oriented to person, place, and time.  Skin: Skin is warm. No rash noted.  Psychiatric: He has a normal mood and affect. His behavior is normal.  Nursing note and vitals reviewed.  Previous notes and tests were reviewed. The plan was reviewed with the patient/family, and all questions/concerned were addressed.  It was my pleasure to see Gary Buchanan today and participate in his care. Please feel free to contact me with any questions or concerns.  Sincerely,  Rexene Alberts, DO Allergy & Immunology  [facility] [place] office: [phone] [facility] office: [place] office: [phone]

## 2019-09-08 ENCOUNTER — Ambulatory Visit: Payer: Medicare Other | Admitting: Allergy

## 2019-09-19 ENCOUNTER — Other Ambulatory Visit: Payer: Self-pay | Admitting: Family Medicine

## 2019-10-06 ENCOUNTER — Ambulatory Visit: Payer: Medicaid Other | Admitting: Internal Medicine

## 2019-10-06 ENCOUNTER — Other Ambulatory Visit: Payer: Self-pay | Admitting: Family Medicine

## 2019-10-06 DIAGNOSIS — J4540 Moderate persistent asthma, uncomplicated: Secondary | ICD-10-CM

## 2019-10-25 ENCOUNTER — Ambulatory Visit: Payer: Medicare Other | Admitting: Podiatry

## 2019-10-26 ENCOUNTER — Other Ambulatory Visit: Payer: Self-pay | Admitting: Family Medicine

## 2019-11-10 ENCOUNTER — Other Ambulatory Visit: Payer: Self-pay | Admitting: Family Medicine

## 2019-11-15 DIAGNOSIS — H547 Unspecified visual loss: Secondary | ICD-10-CM | POA: Insufficient documentation

## 2020-01-15 ENCOUNTER — Ambulatory Visit: Payer: 59 | Admitting: Podiatry

## 2020-01-29 ENCOUNTER — Ambulatory Visit: Payer: 59 | Admitting: Podiatry

## 2020-02-07 ENCOUNTER — Encounter: Payer: Self-pay | Admitting: Podiatry

## 2020-02-07 ENCOUNTER — Ambulatory Visit (INDEPENDENT_AMBULATORY_CARE_PROVIDER_SITE_OTHER): Payer: 59 | Admitting: Podiatry

## 2020-02-07 ENCOUNTER — Other Ambulatory Visit: Payer: Self-pay

## 2020-02-07 DIAGNOSIS — M79675 Pain in left toe(s): Secondary | ICD-10-CM

## 2020-02-07 DIAGNOSIS — E119 Type 2 diabetes mellitus without complications: Secondary | ICD-10-CM | POA: Diagnosis not present

## 2020-02-07 DIAGNOSIS — M79674 Pain in right toe(s): Secondary | ICD-10-CM

## 2020-02-07 DIAGNOSIS — B351 Tinea unguium: Secondary | ICD-10-CM | POA: Diagnosis not present

## 2020-02-07 NOTE — Patient Instructions (Signed)
Diabetes Mellitus and Foot Care Foot care is an important part of your health, especially when you have diabetes. Diabetes may cause you to have problems because of poor blood flow (circulation) to your feet and legs, which can cause your skin to:  Become thinner and drier.  Break more easily.  Heal more slowly.  Peel and crack. You may also have nerve damage (neuropathy) in your legs and feet, causing decreased feeling in them. This means that you may not notice minor injuries to your feet that could lead to more serious problems. Noticing and addressing any potential problems early is the best way to prevent future foot problems. How to care for your feet Foot hygiene  Wash your feet daily with warm water and mild soap. Do not use hot water. Then, pat your feet and the areas between your toes until they are completely dry. Do not soak your feet as this can dry your skin.  Trim your toenails straight across. Do not dig under them or around the cuticle. File the edges of your nails with an emery board or nail file.  Apply a moisturizing lotion or petroleum jelly to the skin on your feet and to dry, brittle toenails. Use lotion that does not contain alcohol and is unscented. Do not apply lotion between your toes. Shoes and socks  Wear clean socks or stockings every day. Make sure they are not too tight. Do not wear knee-high stockings since they may decrease blood flow to your legs.  Wear shoes that fit properly and have enough cushioning. Always look in your shoes before you put them on to be sure there are no objects inside.  To break in new shoes, wear them for just a few hours a day. This prevents injuries on your feet. Wounds, scrapes, corns, and calluses  Check your feet daily for blisters, cuts, bruises, sores, and redness. If you cannot see the bottom of your feet, use a mirror or ask someone for help.  Do not cut corns or calluses or try to remove them with medicine.  If you  find a minor scrape, cut, or break in the skin on your feet, keep it and the skin around it clean and dry. You may clean these areas with mild soap and water. Do not clean the area with peroxide, alcohol, or iodine.  If you have a wound, scrape, corn, or callus on your foot, look at it several times a day to make sure it is healing and not infected. Check for: ? Redness, swelling, or pain. ? Fluid or blood. ? Warmth. ? Pus or a bad smell. General instructions  Do not cross your legs. This may decrease blood flow to your feet.  Do not use heating pads or hot water bottles on your feet. They may burn your skin. If you have lost feeling in your feet or legs, you may not know this is happening until it is too late.  Protect your feet from hot and cold by wearing shoes, such as at the beach or on hot pavement.  Schedule a complete foot exam at least once a year (annually) or more often if you have foot problems. If you have foot problems, report any cuts, sores, or bruises to your health care provider immediately. Contact a health care provider if:  You have a medical condition that increases your risk of infection and you have any cuts, sores, or bruises on your feet.  You have an injury that is not   healing.  You have redness on your legs or feet.  You feel burning or tingling in your legs or feet.  You have pain or cramps in your legs and feet.  Your legs or feet are numb.  Your feet always feel cold.  You have pain around a toenail. Get help right away if:  You have a wound, scrape, corn, or callus on your foot and: ? You have pain, swelling, or redness that gets worse. ? You have fluid or blood coming from the wound, scrape, corn, or callus. ? Your wound, scrape, corn, or callus feels warm to the touch. ? You have pus or a bad smell coming from the wound, scrape, corn, or callus. ? You have a fever. ? You have a red line going up your leg. Summary  Check your feet every day  for cuts, sores, red spots, swelling, and blisters.  Moisturize feet and legs daily.  Wear shoes that fit properly and have enough cushioning.  If you have foot problems, report any cuts, sores, or bruises to your health care provider immediately.  Schedule a complete foot exam at least once a year (annually) or more often if you have foot problems. This information is not intended to replace advice given to you by your health care provider. Make sure you discuss any questions you have with your health care provider. Document Revised: 09/06/2019 Document Reviewed: 01/15/2017 Elsevier Patient Education  2020 Elsevier Inc.  

## 2020-02-09 NOTE — Progress Notes (Signed)
Subjective: Gary Buchanan presents today for follow up of preventative diabetic foot care and painful mycotic nails b/l that are difficult to trim. Pain interferes with ambulation. Aggravating factors include wearing enclosed shoe gear. Pain is relieved with periodic professional debridement.   Allergies  Allergen Reactions  . Latex Itching  . Lisinopril Swelling    Other reaction(s): Other Lip swelling  . Ace Inhibitors Swelling    Lips swells  . Gabapentin Other (See Comments)    Other reaction(s): Other (See Comments) Rapid heartbeat  Other reaction(s): Other Rapid heartbeat  Other reaction(s): Other (See Comments) Rapid heartbeat  Rapid heartbeat   . Tizanidine Other (See Comments)    Other reaction(s): Other chest pain  Other reaction(s): Other (See Comments) Chest pain Other reaction(s): Other (See Comments) Chest pain chest pain   . Ipratropium-Albuterol Palpitations     Objective: There were no vitals filed for this visit.  Vascular Examination:  Capillary refill time to digits immediate b/l, palpable DP pulses b/l, palpable PT pulses b/l, pedal hair sparse b/l and skin temperature gradient within normal limits b/l  Dermatological Examination: Pedal skin with normal turgor, texture and tone bilaterally, no open wounds bilaterally, no interdigital macerations bilaterally and toenails 1-5 b/l elongated, dystrophic, thickened, crumbly with subungual debris  Musculoskeletal: Normal muscle strength 5/5 to all lower extremity muscle groups bilaterally, no pain crepitus or joint limitation noted with ROM b/l and bunion deformity noted b/l  Neurological: Protective sensation intact 5/5 intact bilaterally with 10g monofilament b/l and vibratory sensation intact b/l  Assessment: 1. Pain due to onychomycosis of toenails of both feet   2. Controlled type 2 diabetes mellitus without complication, without long-term current use of insulin ([place])    Plan: -Continue diabetic  foot care principles. Literature dispensed on today.  -Toenails 1-5 b/l were debrided in length and girth without iatrogenic bleeding. -Patient to continue soft, supportive shoe gear daily. -Patient to report any pedal injuries to medical professional immediately. -Patient/POA to call should there be question/concern in the interim.  Return in about 3 months (around 05/06/2020) for diabetic nail trim.

## 2020-02-29 ENCOUNTER — Other Ambulatory Visit: Payer: Self-pay | Admitting: Allergy and Immunology

## 2020-03-10 ENCOUNTER — Other Ambulatory Visit: Payer: Self-pay | Admitting: Family Medicine

## 2020-03-10 DIAGNOSIS — I10 Essential (primary) hypertension: Secondary | ICD-10-CM

## 2020-03-11 ENCOUNTER — Other Ambulatory Visit: Payer: Self-pay | Admitting: Family Medicine

## 2020-03-11 DIAGNOSIS — I10 Essential (primary) hypertension: Secondary | ICD-10-CM

## 2020-03-12 ENCOUNTER — Other Ambulatory Visit: Payer: Self-pay | Admitting: Family Medicine

## 2020-03-12 DIAGNOSIS — I10 Essential (primary) hypertension: Secondary | ICD-10-CM

## 2020-03-19 ENCOUNTER — Other Ambulatory Visit: Payer: Self-pay | Admitting: Family Medicine

## 2020-03-26 ENCOUNTER — Other Ambulatory Visit: Payer: Self-pay | Admitting: Allergy and Immunology

## 2020-03-28 ENCOUNTER — Other Ambulatory Visit: Payer: Self-pay | Admitting: Family Medicine

## 2020-03-28 DIAGNOSIS — I10 Essential (primary) hypertension: Secondary | ICD-10-CM

## 2020-03-28 DIAGNOSIS — E118 Type 2 diabetes mellitus with unspecified complications: Secondary | ICD-10-CM

## 2020-03-31 ENCOUNTER — Other Ambulatory Visit: Payer: Self-pay | Admitting: Family Medicine

## 2020-03-31 DIAGNOSIS — E118 Type 2 diabetes mellitus with unspecified complications: Secondary | ICD-10-CM

## 2020-04-08 ENCOUNTER — Other Ambulatory Visit: Payer: Self-pay | Admitting: Family Medicine

## 2020-04-08 DIAGNOSIS — I10 Essential (primary) hypertension: Secondary | ICD-10-CM

## 2020-05-07 ENCOUNTER — Telehealth: Payer: Self-pay | Admitting: Internal Medicine

## 2020-05-07 ENCOUNTER — Ambulatory Visit: Payer: 59 | Admitting: Podiatry

## 2020-05-07 NOTE — Telephone Encounter (Signed)
Gary Buchanan  Dec 24, 1966  [id number]   Reached out to pt regarding homebound vaccine clinic eligibility. He and his mother, [name], are planning to receive vaccine on 05/08/2020 through Me[employer] Wheels program. He has been given number to clinic and instructed to reach out if something changes.   Alan Ripper, NP-C [facility]

## 2020-05-09 ENCOUNTER — Telehealth: Payer: Self-pay | Admitting: Unknown Physician Specialty

## 2020-05-09 NOTE — Telephone Encounter (Signed)
I connected by phone with Gary Buchanan and/or patient's caregiver on 05/09/2020 at 4:00 PM to discuss the potential vaccination through our Homebound vaccination initiative.   Prevaccination Checklist for COVID-19 Vaccines  1.  Are you feeling sick today? no  2.  Have you ever received a dose of a COVID-19 vaccine?  no      If yes, which one? None   3.  Have you ever had an allergic reaction: (This would include a severe reaction [ e.g., anaphylaxis] that required treatment with epinephrine or EpiPen or that caused you to go to the hospital.  It would also include an allergic reaction that occurred within 4 hours that caused hives, swelling, or respiratory distress, including wheezing.) A.  A previous dose of COVID-19 vaccine. no  B.  A vaccine or injectable therapy that contains multiple components, one of which is a COVID-19 vaccine component, but it is not known which component elicited the immediate reaction. no  C.  Are you allergic to polyethylene glycol? no - Has Latex allergy burning and itching   4.  Have you ever had an allergic reaction to another vaccine (other than COVID-19 vaccine) or an injectable medication? (This would include a severe reaction [ e.g., anaphylaxis] that required treatment with epinephrine or EpiPen or that caused you to go to the hospital.  It would also include an allergic reaction that occurred within 4 hours that caused hives, swelling, or respiratory distress, including wheezing.)  no   5.  Have you ever had a severe allergic reaction (e.g., anaphylaxis) to something other than a component of the COVID-19 vaccine, or any vaccine or injectable medication?  This would include food, pet, venom, environmental, or oral medication allergies.  no   6.  Have you received any vaccine in the last 14 days? no   7.  Have you ever had a positive test for COVID-19 or has a doctor ever told you that you had COVID-19?  no   8.  Have you received passive antibody therapy  (monoclonal antibodies or convalescent serum) as a treatment for COVID-19? no   9.  Do you have a weakened immune system caused by something such as HIV infection or cancer or do you take immunosuppressive drugs or therapies?  no   10.  Do you have a bleeding disorder or are you taking a blood thinner? no Quit blood thinner on his own   11.  Are you pregnant or breast-feeding? no   12.  Do you have dermal fillers? no   __________________   This patient is a 54 y.o. male that meets the FDA criteria to receive homebound vaccination. Patient or parent/caregiver understands they have the option to accept or refuse homebound vaccination.  Patient passed the pre-screening checklist and would like to proceed with homebound vaccination.  Based on questionnaire above, I recommend the patient be observed for 15 minutes.  There are an estimated 2 of other household members/caregivers who are also interested in receiving the vaccine.    I will send the patient's information to our scheduling team who will reach out to schedule the patient and potential caregiver/family members for homebound vaccination.    Kathrine Haddock 05/09/2020 4:00 PM

## 2020-05-13 ENCOUNTER — Ambulatory Visit: Payer: Medicaid Other | Attending: Internal Medicine

## 2020-05-21 ENCOUNTER — Ambulatory Visit: Payer: Medicaid Other

## 2020-06-18 ENCOUNTER — Ambulatory Visit: Payer: Medicaid Other

## 2020-07-21 ENCOUNTER — Other Ambulatory Visit: Payer: Self-pay | Admitting: Family Medicine

## 2020-07-23 ENCOUNTER — Ambulatory Visit: Payer: 59 | Admitting: Podiatry

## 2020-07-31 ENCOUNTER — Encounter: Payer: Self-pay | Admitting: Sports Medicine

## 2020-07-31 ENCOUNTER — Ambulatory Visit (INDEPENDENT_AMBULATORY_CARE_PROVIDER_SITE_OTHER): Payer: 59 | Admitting: Sports Medicine

## 2020-07-31 ENCOUNTER — Ambulatory Visit (INDEPENDENT_AMBULATORY_CARE_PROVIDER_SITE_OTHER): Payer: 59

## 2020-07-31 ENCOUNTER — Other Ambulatory Visit: Payer: Self-pay

## 2020-07-31 DIAGNOSIS — M19071 Primary osteoarthritis, right ankle and foot: Secondary | ICD-10-CM

## 2020-07-31 DIAGNOSIS — M79675 Pain in left toe(s): Secondary | ICD-10-CM | POA: Diagnosis not present

## 2020-07-31 DIAGNOSIS — R609 Edema, unspecified: Secondary | ICD-10-CM | POA: Diagnosis not present

## 2020-07-31 DIAGNOSIS — M21619 Bunion of unspecified foot: Secondary | ICD-10-CM

## 2020-07-31 DIAGNOSIS — B351 Tinea unguium: Secondary | ICD-10-CM | POA: Diagnosis not present

## 2020-07-31 DIAGNOSIS — M19072 Primary osteoarthritis, left ankle and foot: Secondary | ICD-10-CM | POA: Diagnosis not present

## 2020-07-31 DIAGNOSIS — M2142 Flat foot [pes planus] (acquired), left foot: Secondary | ICD-10-CM

## 2020-07-31 DIAGNOSIS — I739 Peripheral vascular disease, unspecified: Secondary | ICD-10-CM

## 2020-07-31 DIAGNOSIS — M19079 Primary osteoarthritis, unspecified ankle and foot: Secondary | ICD-10-CM

## 2020-07-31 DIAGNOSIS — M79674 Pain in right toe(s): Secondary | ICD-10-CM

## 2020-07-31 DIAGNOSIS — M2141 Flat foot [pes planus] (acquired), right foot: Secondary | ICD-10-CM

## 2020-07-31 DIAGNOSIS — E119 Type 2 diabetes mellitus without complications: Secondary | ICD-10-CM

## 2020-07-31 MED ORDER — TRIAMCINOLONE ACETONIDE 10 MG/ML IJ SUSP
10.0000 mg | Freq: Once | INTRAMUSCULAR | Status: AC
  Administered 2020-07-31: 10 mg

## 2020-07-31 NOTE — Progress Notes (Signed)
Subjective: Gary Buchanan is a 54 y.o. male patient who presents to office for evaluation of bilateral foot pain reports pain is swelling that radiates to the ankles and legs for the last 2 to 3 weeks reports that pain is worse with walking and has tried Epson salt without relief reports that he also has noticed that his left hallux toenail chipped off no pain no redness no swelling at the toe.  Patient reports his last blood sugar was 97 last A1c was good and his PCP last visit was 2 months ago.  Patient Active Problem List   Diagnosis Date Noted   Low vision 11/15/2019   Bradycardia 08/24/2019   Other allergic rhinitis 08/24/2019   Non-compliance 04/26/2019   Acute on chronic respiratory failure with hypoxia and hypercapnia (HCC) 05/31/2018   Asthma exacerbation 11/09/2017   Chest pain 10/11/2017   CHF (congestive heart failure) ([place]) 10/11/2017   Bilateral chronic serous otitis media 09/23/2017   SOB (shortness of breath) 07/15/2017   Asthma 06/21/2017   Blindness of left eye 06/21/2017   New onset a-fib ([place]) 06/21/2017   Type 2 diabetes mellitus with complication, without long-term current use of insulin ([place]) 06/21/2017   Hypertension 06/21/2017   Obstructive sleep apnea syndrome 06/21/2017   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 07/11/2016   Hypertensive retinopathy 01/28/2016   Nuclear sclerotic cataract 01/28/2016   Presbyopia 01/28/2016   Ptosis of eyelid 01/28/2016   Mood disorder ([place]) 01/07/2015   Abnormal weight gain 12/11/2014   Hypercalcemia 12/11/2014   Muscle cramping 11/19/2014   Hyperprolactinemia ([place]) 10/10/2014   Male hypogonadism 10/10/2014   Chronic gout due to renal impairment, left ankle and foot, without tophus (tophi) 10/02/2014   Chronic gout of left foot due to renal impairment 10/02/2014   Chronic gout of right foot due to renal  impairment 10/02/2014   Other gouty nephropathy 10/02/2014   Adrenal adenoma 07/23/2014   Gynecomastia 01/09/2014   Arthritis, multiple joint involvement 11/10/2013   Disorder of joint 11/10/2013   Anemia due to chronic kidney disease 01/31/2013   Chronic kidney disease, stage III (moderate) 01/31/2013   Disorder of optic nerve 01/27/2013   Glaucoma suspect 01/27/2013   Nuclear senile cataract 01/27/2013   Optic atrophy 01/27/2013   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   Senile nuclear sclerosis 01/27/2013   Herpes dermatitis 11/30/2012   Herpes simplex complications 17/49/4496   Herpes simplex with complication 75/91/6384   Leg cramps 11/30/2012   Blepharitis 09/15/2012   Impairment level: one eye: total impairment: other eye: not specified 09/15/2012   Myogenic ptosis of eyelid 09/15/2012   Ptosis, myogenic 09/15/2012   Total impairment of one eye 09/15/2012   Visual loss, one eye, no light perception ([facility]) 09/15/2012   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 3 Inhaler 1   allopurinol (ZYLOPRIM) 100 MG tablet Take 200 mg by mouth daily.     allopurinol (ZYLOPRIM) 300 MG tablet Take 1 tablet (300  mg total) by mouth daily. 90 tablet 3   amiodarone (PACERONE) 200 MG tablet Take by mouth.     amLODipine (NORVASC) 10 MG tablet Take 1 tablet (10 mg total) by mouth daily. 90 tablet 3   apixaban (ELIQUIS) 5 MG TABS tablet Take by mouth.     aspirin EC 81 MG tablet Take 81 mg by mouth daily.      Blood Glucose Monitoring Suppl (ACCU-CHEK AVIVA PLUS) w/Device KIT USE DAILY AS DIRECTED 1 kit 0   BREO ELLIPTA 100-25 MCG/INH AEPB 1 puff daily.     Brinzolamide-Brimonidine 1-0.2 % SUSP Place 1 drop into the right eye 3 (three) times daily.     budesonide (PULMICORT) 0.5 MG/2ML nebulizer solution Take 2 mLs (0.5 mg total) by nebulization 2 (two) times daily.     cloNIDine (CATAPRES) 0.3 MG tablet Take by mouth.     colchicine 0.6 MG tablet Take 0.6 mg by mouth daily.     diclofenac sodium (VOLTAREN) 1 % GEL Apply 2 g topically 4 (four) times daily. 100 g 3   ELIQUIS 5 MG TABS tablet Take 5 mg by mouth 2 (two) times daily.     febuxostat (ULORIC) 40 MG tablet Take 40 mg by mouth daily.     ferrous sulfate 325 (65 FE) MG tablet Take 325 mg by mouth daily with breakfast.     FLOVENT HFA 220 MCG/ACT inhaler TAKE 2 PUFFS BY MOUTH TWICE A DAY 12 Inhaler 0   fluticasone (FLONASE) 50 MCG/ACT nasal spray Place 2 sprays into both nostrils daily. 16 g 5   fluticasone furoate-vilanterol (BREO ELLIPTA) 100-25 MCG/INH AEPB Inhale into the lungs.     folic acid (FOLVITE) 354 MCG tablet Take 400 mcg by mouth daily.     furosemide (LASIX) 20 MG tablet TAKE 1 TABLET BY MOUTH EVERY MORNING FOR BLOOD PRESSURE AND FLUID RETENTION. (Patient taking differently: Take 20 mg by mouth daily. FOR BLOOD PRESSURE AND FLUID RETENTION.) 90 tablet 3   guaifenesin (ROBITUSSIN) 100 MG/5ML syrup Take by mouth.     ipratropium-albuterol (DUONEB) 0.5-2.5 (3) MG/3ML SOLN Take 3 mLs by nebulization every 6 (six) hours as needed. 360 mL 5   Latanoprostene Bunod (VYZULTA) 0.024 % SOLN Place 1 drop into the right eye nightly.     loratadine (CLARITIN) 10 MG tablet TAKE 1 TABLET BY MOUTH EVERY DAY (Patient taking differently: Take 10 mg by mouth daily. ) 90 tablet 3   losartan (COZAAR) 50 MG tablet Take 1 tablet (50 mg total) by mouth daily. 90 tablet 3   magnesium gluconate (MAGONATE) 500 MG tablet Take 1 tablet  (500 mg total) by mouth daily. 90 tablet 3   magnesium oxide (MAG-OX) 400 MG tablet TK 1 T PO HS     metoprolol tartrate (LOPRESSOR) 50 MG tablet Take 50 mg by mouth 2 (two) times daily.     montelukast (SINGULAIR) 10 MG tablet Take 10 mg by mouth daily.     Multiple Vitamins-Minerals (SENTRY SENIOR PO) Take 1 tablet by mouth daily.     predniSONE (DELTASONE) 20 MG tablet 3 tabs po daily x 3 days, then 2 tabs x 3 days, then 1.5 tabs x 3 days, then 1 tab x 3 days, then 0.5 tabs x 3 days 27 tablet 0   pyridOXINE (VITAMIN B-6) 100 MG tablet Take 100 mg by mouth daily.     sitaGLIPtin (JANUVIA) 50 MG tablet Take 1 tablet (50 mg total) by mouth daily. 90 tablet 3  timolol (BETIMOL) 0.25 % ophthalmic solution Place 1-2 drops into the right eye 2 (two) times daily.     timolol (TIMOPTIC) 0.25 % ophthalmic solution Place 1 drop into the left eye 2 (two) times daily.     Tiotropium Bromide Monohydrate (SPIRIVA RESPIMAT) 1.25 MCG/ACT AERS Inhale 2 puffs into the lungs daily. 4 g 5   Travoprost, BAK Free, (TRAVATAN) 0.004 % SOLN ophthalmic solution Administer 1 drop to both eyes nightly.     valACYclovir (VALTREX) 1000 MG tablet Take 1 tablet (1,000 mg total) by mouth daily. 90 tablet 3   vitamin E (VITAMIN E) 200 UNIT capsule Take 200 Units by mouth daily.     VYZULTA 0.024 % SOLN Apply 1 drop to eye at bedtime.     No current facility-administered medications on file prior to visit.    Allergies  Allergen Reactions   Latex Itching   Lisinopril Swelling    Other reaction(s): Other Lip swelling   Ace Inhibitors Swelling    Lips swells   Gabapentin Other (See Comments)    Other reaction(s): Other (See Comments) Rapid heartbeat  Other reaction(s): Other Rapid heartbeat  Other reaction(s): Other (See Comments) Rapid heartbeat  Rapid heartbeat    Tizanidine Other (See Comments)    Other reaction(s): Other chest pain  Other reaction(s): Other (See Comments) Chest  pain Other reaction(s): Other (See Comments) Chest pain chest pain    Ipratropium-Albuterol Palpitations    Objective:  General: Alert and oriented x3 in no acute distress  Dermatology: No open lesions bilateral lower extremities, no webspace macerations, no ecchymosis bilateral, all nails x are elongated and thickened consistent with onychomycosis.  Vascular: Dorsalis Pedis and Posterior Tibial pedal pulses nonpalpable, Capillary Fill Time five seconds,(-) pedal hair growth bilateral, 2+ pitting edema bilateral lower extremities, Temperature gradient within normal limits.  Neurology: Johney Maine sensation intact via light touch bilateral.  Musculoskeletal: Mild tenderness with palpation at dorsal midfoot bilateral.  Pes planus foot type.  No pain to palpation to calf.  Negative Homans' sign.  Pes planus foot type and bunion deformity.  Gait: Antalgic gait  Xrays  Right and left foot   Impression: Diffuse arthritis  Assessment and Plan: Problem List Items Addressed This Visit    None    Visit Diagnoses    Swelling    -  Primary   Relevant Orders   DG Foot Complete Right   DG Foot Complete Left   Arthritis of foot       Relevant Medications   triamcinolone acetonide (KENALOG) 10 MG/ML injection 10 mg (Completed) (Start on 07/31/2020 12:45 PM)   PVD (peripheral vascular disease) ([place])       Controlled type 2 diabetes mellitus without complication, without long-term current use of insulin (HCC)       Pes planus of both feet       Bunion       Pain due to onychomycosis of toenails of both feet          -Complete examination performed -Xrays reviewed -Discussed treatement options for PVD and arthritis and long nails -Mechanically debrided nails x10 using sterile nail nipper without incident -Advised patient to continue with elevation taking his Lasix and use of compression stockings for edema control and advised patient if fails to help patient discuss with his PCP medication  manage for his edema versus seeing a vein and vascular/lymphedema specialist -After oral consent and aseptic prep, injected a mixture containing 1 ml of 2%  plain lidocaine,  1 ml 0.5% plain marcaine, 0.5 ml of kenalog 10 and 0.5 ml of dexamethasone phosphate into dorsal right and left foot at the midtarsal joint for arthritis pain bilateral without complication. Post-injection care discussed with patient.  -Advised patient that he may benefit from diabetic shoes and insoles office to check benefits and call him to discuss -Patient to return to office as needed or sooner if condition worsens.  Landis Martins, DPM

## 2020-08-06 ENCOUNTER — Other Ambulatory Visit: Payer: Self-pay | Admitting: Sports Medicine

## 2020-08-06 DIAGNOSIS — M19079 Primary osteoarthritis, unspecified ankle and foot: Secondary | ICD-10-CM

## 2020-08-06 DIAGNOSIS — R609 Edema, unspecified: Secondary | ICD-10-CM

## 2020-11-04 ENCOUNTER — Other Ambulatory Visit: Payer: Self-pay | Admitting: Family Medicine

## 2020-12-25 ENCOUNTER — Other Ambulatory Visit: Payer: Self-pay | Admitting: Family Medicine

## 2021-01-28 IMAGING — CR CHEST - 2 VIEW
2 series · 2 of 2 positions shown · non-contrast
Comparison: 07/21/2019

CLINICAL DATA: Shortness of breath

EXAM:
CHEST - 2 VIEW

[chest pa]
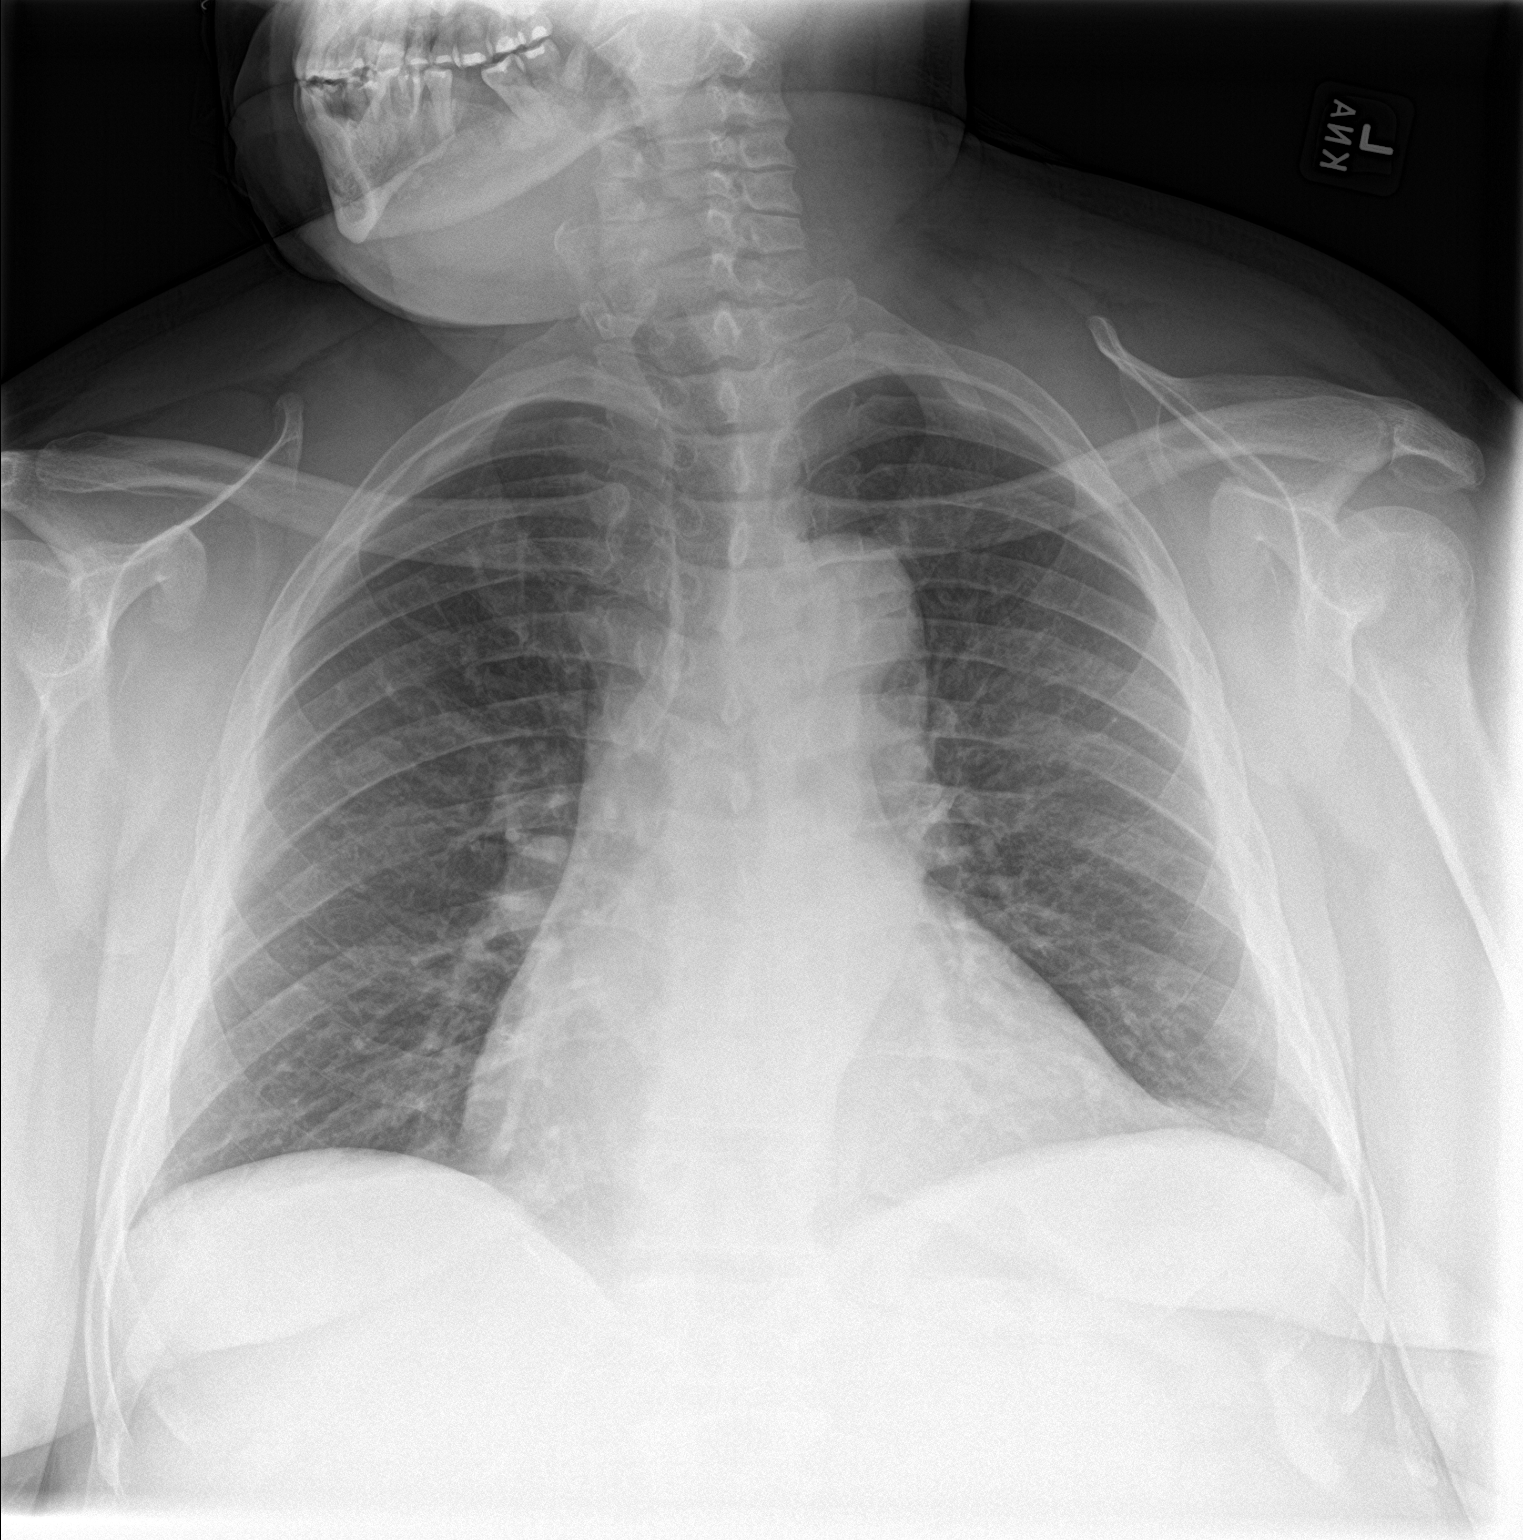

[chest lat]
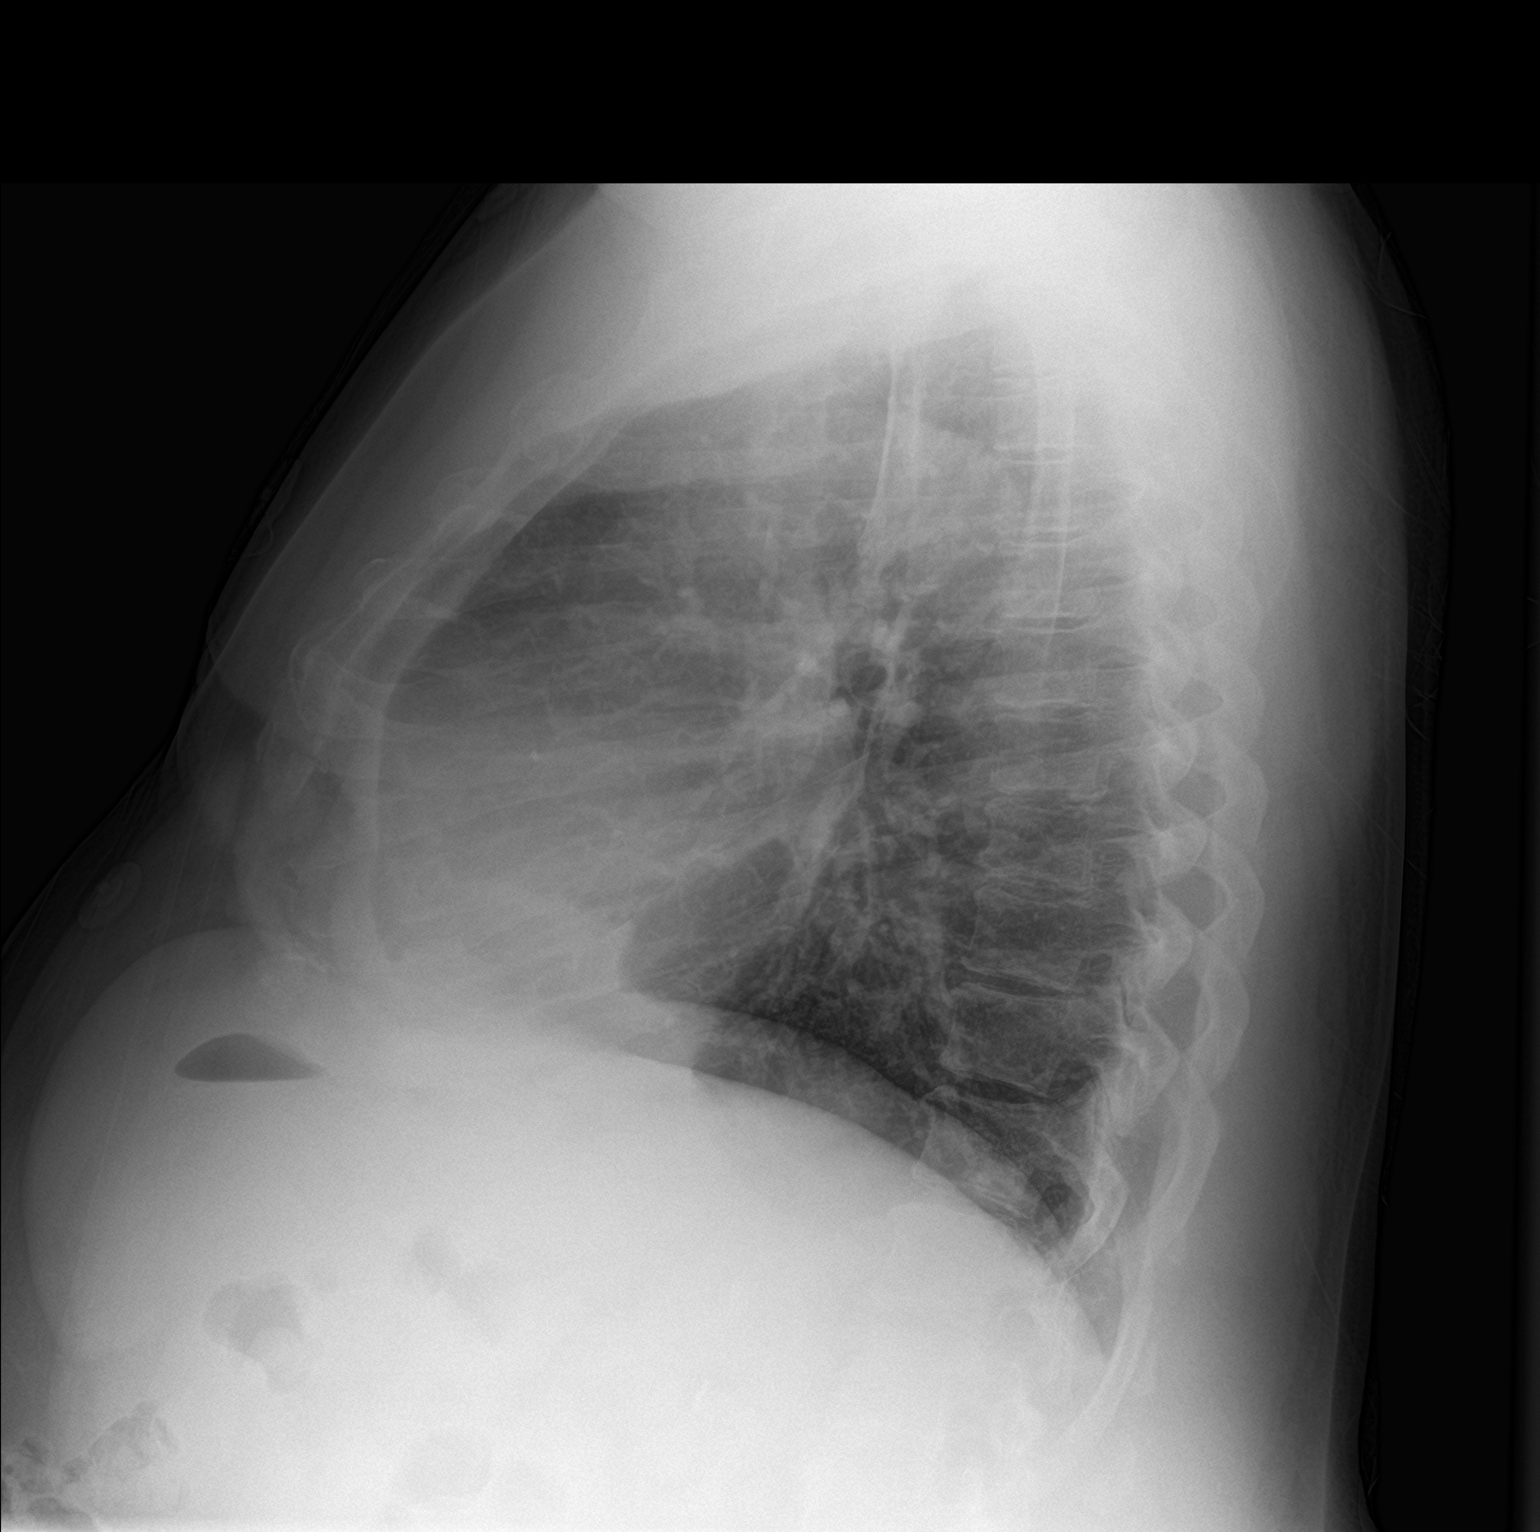

[2 of 2 positions shown; findings below may reference images not displayed]

FINDINGS: The heart size and mediastinal contours are mildly enlarged, stable.
Both lungs are clear. The visualized skeletal structures are
unremarkable.
IMPRESSION: No active cardiopulmonary disease.

## 2021-02-20 ENCOUNTER — Ambulatory Visit (INDEPENDENT_AMBULATORY_CARE_PROVIDER_SITE_OTHER): Payer: 59 | Admitting: Podiatry

## 2021-02-20 ENCOUNTER — Encounter: Payer: Self-pay | Admitting: Podiatry

## 2021-02-20 ENCOUNTER — Other Ambulatory Visit: Payer: Self-pay

## 2021-02-20 DIAGNOSIS — M79675 Pain in left toe(s): Secondary | ICD-10-CM

## 2021-02-20 DIAGNOSIS — E119 Type 2 diabetes mellitus without complications: Secondary | ICD-10-CM

## 2021-02-20 DIAGNOSIS — B351 Tinea unguium: Secondary | ICD-10-CM

## 2021-02-20 DIAGNOSIS — M79674 Pain in right toe(s): Secondary | ICD-10-CM

## 2021-02-20 NOTE — Progress Notes (Signed)
Subjective:   Patient ID: Gary Buchanan, male   DOB: 55 y.o.   MRN: [id number]   HPI Patient presents stating he has been in poor health and has had some swelling in his legs secondary to kidney disease has long-term diabetes not in good control needs diabetic shoes and has thick nailbeds 1-5 both feet that he cannot cut himself   ROS      Objective:  Physical Exam  Neurovascular status unchanged from previous visits with edema in the ankle region bilateral secondary to kidney disease with negative Bevelyn Buckles' sign noted.  Thick yellow brittle nailbeds 1-5 both feet painful     Assessment:  At risk diabetic with vascular issues kidney issues and moderate diminishment sharp dull vibratory with nail disease mycotic in nature with pain     Plan:  Reviewed diabetic education that is been reviewed before debrided nailbeds 1-5 both feet and recommended long-term diabetic shoes we will try to get him approval to have these made for the future.  Encourage daily inspections and let us know of any issues were to occur

## 2021-03-11 ENCOUNTER — Telehealth: Payer: Self-pay | Admitting: Podiatry

## 2021-03-11 NOTE — Telephone Encounter (Signed)
Received voicemail from [place] @ pcp office and she was asking for documents to be signed off on for diabetic shoes but did not leave pts name.  I returned call and pt gave me name of pt and I explained pt has to be seen and pick out shoes before we send paperwork. She is notifying pt.  There fax # is [phone].Marland Kitchen

## 2021-03-13 ENCOUNTER — Ambulatory Visit: Payer: 59 | Admitting: Sports Medicine

## 2021-04-01 ENCOUNTER — Telehealth: Payer: Self-pay | Admitting: Podiatry

## 2021-04-01 NOTE — Telephone Encounter (Signed)
Pt left message checking stating he has contacted his pcp about diabetic shoes and needs to know what needs to be done now to get the shoes.  I returned call and left message for pt to call to schedule an appt to be measured for diabetic shoes and inserts. That is what is needed and we will send paperwork once this is done.

## 2021-04-09 ENCOUNTER — Other Ambulatory Visit: Payer: 59

## 2021-04-09 ENCOUNTER — Ambulatory Visit (INDEPENDENT_AMBULATORY_CARE_PROVIDER_SITE_OTHER): Payer: 59 | Admitting: Podiatry

## 2021-04-09 ENCOUNTER — Other Ambulatory Visit: Payer: Self-pay

## 2021-04-09 DIAGNOSIS — M2141 Flat foot [pes planus] (acquired), right foot: Secondary | ICD-10-CM

## 2021-04-09 DIAGNOSIS — M2142 Flat foot [pes planus] (acquired), left foot: Secondary | ICD-10-CM

## 2021-04-09 DIAGNOSIS — E119 Type 2 diabetes mellitus without complications: Secondary | ICD-10-CM

## 2021-04-09 NOTE — Progress Notes (Signed)
Patient presented for foam casting for 3 pair custom diabetic shoe inserts. Patient is measured with a [employer] Device to be a size 8 x-wide.  Diabetic shoes are chosen form the [employer].  The shoes chosen are [id number]  The patient will be contacted when the shoes and inserts are ready to be picked up.

## 2021-04-23 ENCOUNTER — Telehealth: Payer: Self-pay | Admitting: Podiatry

## 2021-04-23 NOTE — Telephone Encounter (Signed)
Pt left message this morning asking about diabetic shoes. Said he was measured on 4.4 and was told they would be in within a month and it is almost been a month.  I returned call and todl pt we have not received documents for the diabetic shoes yet and it has been faxed a few times. I also explained that it usually takes 4 to 6 wks and he was measured on 4.13.2022..he was calling his pcp office to check with them.

## 2021-05-07 ENCOUNTER — Telehealth: Payer: Self-pay | Admitting: Podiatry

## 2021-05-07 NOTE — Telephone Encounter (Signed)
Mailed diabetic shoe paperwork to Dr Nance Pew.

## 2021-05-14 ENCOUNTER — Telehealth: Payer: Self-pay | Admitting: Podiatry

## 2021-05-14 NOTE — Telephone Encounter (Signed)
Pt called checking on status of diabetic shoes. Upon looking we have not received the documents yet. He said he was at the doctor last week and was to be sending it and I told him we have not received it yet. He was going to call there office.

## 2021-05-15 ENCOUNTER — Telehealth: Payer: Self-pay | Admitting: Podiatry

## 2021-05-15 NOTE — Telephone Encounter (Signed)
Received fax today from [place] pts pcp office with a rx to [facility].   I called pt and he said he did not want to go to [facility] we had already seen him and he picked out his shoes here. I explained that we have faxed the documents multiple times and have not received them back yet. I also told pt [facility] is no longer doing diabetic shoes and inserts.He was going to call the office.

## 2021-05-27 ENCOUNTER — Telehealth: Payer: Self-pay | Admitting: Podiatry

## 2021-05-27 NOTE — Telephone Encounter (Signed)
Pt called checking on status of diabetic shoes. I explained that the pcp office has not signed the paperwork. He said he was going to a new doctor in [place] at [facility]. She was an [place] doctor and he was going to talk to her at his appt  Tomorrow. I told pt to get her full name and call me with it and I will send the paperwork to her.

## 2021-06-02 ENCOUNTER — Telehealth: Payer: Self-pay | Admitting: Podiatry

## 2021-06-02 NOTE — Telephone Encounter (Signed)
Received a call from [place] at [facility] about pts diabetic shoes. She stated she called the pcp and they stated it was faxed on 5.18.2022.  I apologized but we have not gotten the fax and it has been faxed multiple times(6) thru [employer]. I did give Levada Dy my name and the office fax number to have them fax it directly to me.

## 2021-06-04 ENCOUNTER — Telehealth: Payer: Self-pay | Admitting: Podiatry

## 2021-06-04 NOTE — Telephone Encounter (Signed)
Received a call from [place] @ pcp office asking if we had gotten the paperwork for pt to get shoes. I looked on [employer] and we have not received the documents. She said they faxed it on 5.18 but I asked her to fax it directly to me.  She then called me back and asked to let her know when we got it and I explained we did get it but its a referral to [facility] for diabetic shoes and we are not affiliated with them. We have specific paperwork that has been faxed several times. She gave me the fax number and I will fax the needed documents to [facility].  Pt called in between the calls and said he was finding a new doctor and to not worry about calling them. I told pt if he does get a new doctor to let me know who and when the appt is and I will get the documents to them. The order is on hold. He said thank you.

## 2021-06-05 ENCOUNTER — Telehealth: Payer: Self-pay | Admitting: Podiatry

## 2021-06-05 NOTE — Telephone Encounter (Signed)
Pt called stating he was having a hard time finding a new doctor.   I told pt that this morning I got the correct documents from [facility] that were signed and I have faxed it to the company to make the inserts. I told him Luellen Pucker from that practice took care of it. He said that is good news and I told him that I would call when the shoes/inserts come in. It generally takes 2 to 3 wks to get them once we get the signed documents. He said thank you

## 2021-06-18 ENCOUNTER — Ambulatory Visit: Payer: 59 | Admitting: Podiatry

## 2021-07-10 ENCOUNTER — Other Ambulatory Visit: Payer: Self-pay

## 2021-07-10 ENCOUNTER — Encounter: Payer: Self-pay | Admitting: Sports Medicine

## 2021-07-10 ENCOUNTER — Ambulatory Visit (INDEPENDENT_AMBULATORY_CARE_PROVIDER_SITE_OTHER): Payer: 59 | Admitting: Sports Medicine

## 2021-07-10 DIAGNOSIS — I739 Peripheral vascular disease, unspecified: Secondary | ICD-10-CM | POA: Diagnosis not present

## 2021-07-10 DIAGNOSIS — M2141 Flat foot [pes planus] (acquired), right foot: Secondary | ICD-10-CM

## 2021-07-10 DIAGNOSIS — B351 Tinea unguium: Secondary | ICD-10-CM

## 2021-07-10 DIAGNOSIS — E119 Type 2 diabetes mellitus without complications: Secondary | ICD-10-CM | POA: Diagnosis not present

## 2021-07-10 DIAGNOSIS — M2142 Flat foot [pes planus] (acquired), left foot: Secondary | ICD-10-CM

## 2021-07-10 DIAGNOSIS — M79674 Pain in right toe(s): Secondary | ICD-10-CM

## 2021-07-10 DIAGNOSIS — M79675 Pain in left toe(s): Secondary | ICD-10-CM

## 2021-07-10 NOTE — Progress Notes (Signed)
Subjective: Gary Buchanan is a 55 y.o. male patient with history of diabetes who presents to office today complaining of long,mildly painful nails  while ambulating in shoes; unable to trim. Patient states that the glucose reading this morning was not recorded but last A1c as noted in chart 6.5.  No other pedal complaints noted.  Patient Active Problem List   Diagnosis Date Noted   Low vision 11/15/2019   Bradycardia 08/24/2019   Other allergic rhinitis 08/24/2019   Non-compliance 04/26/2019   Acute on chronic respiratory failure with hypoxia and hypercapnia (HCC) 05/31/2018   Asthma exacerbation 11/09/2017   Chest pain 10/11/2017   CHF (congestive heart failure) ([place]) 10/11/2017   Bilateral chronic serous otitis media 09/23/2017   SOB (shortness of breath) 07/15/2017   Asthma 06/21/2017   Blindness of left eye 06/21/2017   New onset a-fib ([place]) 06/21/2017   Type 2 diabetes mellitus with complication, without long-term current use of insulin ([place]) 06/21/2017   Hypertension 06/21/2017   Obstructive sleep apnea syndrome 06/21/2017   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 07/11/2016   Hypertensive retinopathy 01/28/2016   Nuclear sclerotic cataract 01/28/2016   Presbyopia 01/28/2016   Ptosis of eyelid 01/28/2016   Mood disorder ([place]) 01/07/2015   Abnormal weight gain 12/11/2014   Hypercalcemia 12/11/2014   Muscle cramping 11/19/2014   Hyperprolactinemia ([place]) 10/10/2014   Male hypogonadism 10/10/2014   Chronic gout due to renal impairment, left ankle and foot, without tophus (tophi) 10/02/2014   Chronic gout of left foot due to renal impairment 10/02/2014   Chronic gout of right foot due to renal impairment 10/02/2014   Other gouty nephropathy 10/02/2014   Adrenal adenoma 07/23/2014   Gynecomastia 01/09/2014   Arthritis, multiple joint involvement 11/10/2013   Disorder of joint 11/10/2013    Anemia due to chronic kidney disease 01/31/2013   Chronic kidney disease, stage III (moderate) ([place]) 01/31/2013   Disorder of optic nerve 01/27/2013   Glaucoma suspect 01/27/2013   Nuclear senile cataract 01/27/2013   Optic atrophy 01/27/2013   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   Senile nuclear sclerosis 01/27/2013   Herpes dermatitis 11/30/2012   Herpes simplex complications 62/37/6283   Herpes simplex with complication 15/17/6160   Leg cramps 11/30/2012   Blepharitis 09/15/2012   Impairment level: one eye: total impairment: other eye: not specified 09/15/2012   Myogenic ptosis of eyelid 09/15/2012   Ptosis, myogenic 09/15/2012   Total impairment of one eye 09/15/2012   Visual loss, one eye, no light perception ([place]) 09/15/2012   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 3 Inhaler 1   allopurinol (ZYLOPRIM) 100 MG tablet Take 200 mg by mouth daily.     allopurinol (ZYLOPRIM) 300 MG tablet Take 1 tablet (300 mg total) by mouth daily. 90 tablet 3   amiodarone (PACERONE) 200 MG tablet Take by mouth.     amLODipine (NORVASC) 10 MG tablet Take 1 tablet (10 mg total) by mouth daily. 90 tablet 3  apixaban (ELIQUIS) 5 MG TABS tablet Take by mouth.     aspirin EC 81 MG tablet Take 81 mg by mouth daily.     Blood Glucose Monitoring Suppl (ACCU-CHEK AVIVA PLUS) w/Device KIT USE DAILY AS DIRECTED 1 kit 0   BREO ELLIPTA 100-25 MCG/INH AEPB 1 puff daily.     Brinzolamide-Brimonidine 1-0.2 % SUSP Place 1 drop into the right eye 3 (three)  times daily.     budesonide (PULMICORT) 0.5 MG/2ML nebulizer solution Take 2 mLs (0.5 mg total) by nebulization 2 (two) times daily.     cloNIDine (CATAPRES) 0.3 MG tablet Take by mouth.     colchicine 0.6 MG tablet Take 0.6 mg by mouth daily.     diclofenac sodium (VOLTAREN) 1 % GEL Apply 2 g topically 4 (four) times daily. 100 g 3   ELIQUIS 5 MG TABS tablet Take 5 mg by mouth 2 (two) times daily.     febuxostat (ULORIC) 40 MG tablet Take 40 mg by mouth daily.     ferrous sulfate 325 (65 FE) MG tablet Take 325 mg by mouth daily with breakfast.     FLOVENT HFA 220 MCG/ACT inhaler TAKE 2 PUFFS BY MOUTH TWICE A DAY 12 Inhaler 0   fluticasone (FLONASE) 50 MCG/ACT nasal spray Place 2 sprays into both nostrils daily. 16 g 5   fluticasone furoate-vilanterol (BREO ELLIPTA) 100-25 MCG/INH AEPB Inhale into the lungs.     folic acid (FOLVITE) 378 MCG tablet Take 400 mcg by mouth daily.     furosemide (LASIX) 20 MG tablet TAKE 1 TABLET BY MOUTH EVERY MORNING FOR BLOOD PRESSURE AND FLUID RETENTION. (Patient taking differently: Take 20 mg by mouth daily. FOR BLOOD PRESSURE AND FLUID RETENTION.) 90 tablet 3   guaifenesin (ROBITUSSIN) 100 MG/5ML syrup Take by mouth.     ipratropium-albuterol (DUONEB) 0.5-2.5 (3) MG/3ML SOLN Take 3 mLs by nebulization every 6 (six) hours as needed. 360 mL 5   Latanoprostene Bunod (VYZULTA) 0.024 % SOLN Place 1 drop into the right eye nightly.     loratadine (CLARITIN) 10 MG tablet TAKE 1 TABLET BY MOUTH EVERY DAY (Patient taking differently: Take 10 mg by mouth daily. ) 90 tablet 3   losartan (COZAAR) 50 MG tablet Take 1 tablet (50 mg total) by mouth daily. 90 tablet 3   magnesium gluconate (MAGONATE) 500 MG tablet Take 1 tablet (500 mg total) by mouth daily. 90 tablet 3   magnesium oxide (MAG-OX) 400 MG tablet TK 1 T PO HS     metoprolol tartrate (LOPRESSOR) 50 MG tablet Take 50 mg by mouth 2 (two) times daily.     montelukast (SINGULAIR) 10 MG tablet Take 10 mg by mouth  daily.     Multiple Vitamins-Minerals (SENTRY SENIOR PO) Take 1 tablet by mouth daily.     predniSONE (DELTASONE) 20 MG tablet 3 tabs po daily x 3 days, then 2 tabs x 3 days, then 1.5 tabs x 3 days, then 1 tab x 3 days, then 0.5 tabs x 3 days 27 tablet 0   pyridOXINE (VITAMIN B-6) 100 MG tablet Take 100 mg by mouth daily.     sitaGLIPtin (JANUVIA) 50 MG tablet Take 1 tablet (50 mg total) by mouth daily. 90 tablet 3   timolol (BETIMOL) 0.25 % ophthalmic solution Place 1-2 drops into the right eye 2 (two) times daily.     timolol (TIMOPTIC) 0.25 % ophthalmic solution Place 1 drop into the left eye 2 (two) times daily.  Tiotropium Bromide Monohydrate (SPIRIVA RESPIMAT) 1.25 MCG/ACT AERS Inhale 2 puffs into the lungs daily. 4 g 5   Travoprost, BAK Free, (TRAVATAN) 0.004 % SOLN ophthalmic solution Administer 1 drop to both eyes nightly.     valACYclovir (VALTREX) 1000 MG tablet Take 1 tablet (1,000 mg total) by mouth daily. 90 tablet 3   vitamin E (VITAMIN E) 200 UNIT capsule Take 200 Units by mouth daily.     VYZULTA 0.024 % SOLN Apply 1 drop to eye at bedtime.     No current facility-administered medications on file prior to visit.   Allergies  Allergen Reactions   Latex Itching   Lisinopril Swelling    Other reaction(s): Other Lip swelling   Ace Inhibitors Swelling    Lips swells   Gabapentin Other (See Comments)    Other reaction(s): Other (See Comments) Rapid heartbeat  Other reaction(s): Other Rapid heartbeat  Other reaction(s): Other (See Comments) Rapid heartbeat  Rapid heartbeat    Tizanidine Other (See Comments)    Other reaction(s): Other chest pain  Other reaction(s): Other (See Comments) Chest pain Other reaction(s): Other (See Comments) Chest pain chest pain    Ipratropium-Albuterol Palpitations    No results found for this or any previous visit (from the past 2160 hour(s)).  Objective: General: Patient is awake, alert, and oriented x 3 and in no acute  distress.  Integument: Skin is warm, dry and supple bilateral. Nails are tender, long, thickened and  dystrophic with subungual debris, consistent with onychomycosis, 1-5 bilateral. No signs of infection. No open lesions or preulcerative lesions present bilateral. Remaining integument unremarkable.  Vasculature:  Dorsalis Pedis pulse 1/4 bilateral. Posterior Tibial pulse 0/4 bilateral.  Capillary fill time <5 sec 1-5 bilateral.  No hair growth to the level of the digits. Temperature gradient within normal limits.  Chronic edema noted to bilateral lower extremities with trophic skin changes.  No varicosities present bilateral.  Neurology: Gross sensation present via light touch bilateral.  Musculoskeletal: Asymptomatic pes planus pedal deformities noted bilateral. Muscular strength 5/5 in all lower extremity muscular groups bilateral without pain on range of motion . No tenderness with calf compression bilateral.  Assessment and Plan: Problem List Items Addressed This Visit   None Visit Diagnoses     Pain due to onychomycosis of toenails of both feet    -  Primary   PVD (peripheral vascular disease) ([place])       Controlled type 2 diabetes mellitus without complication, without long-term current use of insulin (HCC)       Pes planus of both feet           -Examined patient. -Discussed and educated patient on diabetic foot care, especially with  regards to the vascular, neurological and musculoskeletal systems.  -Stressed the importance of good glycemic control and the detriment of not  controlling glucose levels in relation to the foot. -Mechanically debrided all nails 1-5 bilateral using sterile nail nipper and filed with dremel without incident  -Encourage elevation to assist with edema control -Answered all patient questions -Patient to return  in 3 months for at risk foot care -Patient advised to call the office if any problems or questions arise in the meantime.  Landis Martins, DPM

## 2021-08-07 ENCOUNTER — Other Ambulatory Visit: Payer: Self-pay

## 2021-08-07 ENCOUNTER — Ambulatory Visit (INDEPENDENT_AMBULATORY_CARE_PROVIDER_SITE_OTHER): Payer: 59

## 2021-08-07 DIAGNOSIS — M2042 Other hammer toe(s) (acquired), left foot: Secondary | ICD-10-CM

## 2021-08-07 DIAGNOSIS — E119 Type 2 diabetes mellitus without complications: Secondary | ICD-10-CM

## 2021-08-07 DIAGNOSIS — M2041 Other hammer toe(s) (acquired), right foot: Secondary | ICD-10-CM

## 2021-08-07 NOTE — Progress Notes (Signed)
Patient in office today to pick-up diabetic shoes. Shoes were tried on and patient was satisfied with the fit. Patient was educated on the break-in process and verbalized understanding. Advised patient to call the office with any questions, comments or concerns.

## 2021-08-08 ENCOUNTER — Telehealth: Payer: Self-pay | Admitting: Podiatry

## 2021-08-08 NOTE — Telephone Encounter (Signed)
Pt called regarding the diabetic shoes he picked up yesterday, he stated he ordered high tops.I told pt we do not carry high top tennis shoes for diabetic shoes. We do have boots if he would like to change the shoes. He said he did not want boots. He then said they are too long. I asked if he tried them on yesterday and he said yes. I told pt to try them around the house for a week and to call me next week if they are not working for pt. He said he would so this.

## 2021-08-15 ENCOUNTER — Telehealth: Payer: Self-pay | Admitting: Sports Medicine

## 2021-08-15 NOTE — Telephone Encounter (Signed)
Pt left message stating his diabetic shoes are slipping on his feet when walking. HE was told to try them for a week in the house and if there was a issue to call and schedule an appt.  I returned call and left message for pt to call me back to schedule an appt.

## 2021-08-22 ENCOUNTER — Other Ambulatory Visit: Payer: Self-pay

## 2021-08-22 ENCOUNTER — Ambulatory Visit (INDEPENDENT_AMBULATORY_CARE_PROVIDER_SITE_OTHER): Payer: 59

## 2021-08-22 DIAGNOSIS — E119 Type 2 diabetes mellitus without complications: Secondary | ICD-10-CM

## 2021-08-22 NOTE — Progress Notes (Signed)
Patient in office today to return diabetic shoes that are slightly too big. Patient foot was measured at a 7.5 x-wide men today. Shoes that are be being returned were a 8 x-wide men's. Patient would like to order shoe X801M Apex Lace Walker in a size 7.5 x-wide men's. Advised patient the office will call when new shoes are ready for pick-up. Patient verbalized understanding.

## 2021-08-25 ENCOUNTER — Ambulatory Visit: Payer: 59 | Admitting: Allergy

## 2021-09-05 ENCOUNTER — Telehealth: Payer: Self-pay | Admitting: Sports Medicine

## 2021-09-05 NOTE — Telephone Encounter (Signed)
Re ordered diabetic shoes in.. lvm for pt to call to schedule an appt to pick them up.. next available 9.22 or 9.23

## 2021-09-10 ENCOUNTER — Other Ambulatory Visit: Payer: Self-pay

## 2021-09-10 ENCOUNTER — Ambulatory Visit (INDEPENDENT_AMBULATORY_CARE_PROVIDER_SITE_OTHER): Payer: Medicaid Other | Admitting: *Deleted

## 2021-09-10 DIAGNOSIS — M2142 Flat foot [pes planus] (acquired), left foot: Secondary | ICD-10-CM

## 2021-09-10 DIAGNOSIS — I739 Peripheral vascular disease, unspecified: Secondary | ICD-10-CM

## 2021-09-10 DIAGNOSIS — E119 Type 2 diabetes mellitus without complications: Secondary | ICD-10-CM

## 2021-09-10 DIAGNOSIS — M2141 Flat foot [pes planus] (acquired), right foot: Secondary | ICD-10-CM

## 2021-09-10 NOTE — Progress Notes (Signed)
Patient presents today to pick up reordered diabetic shoes.  Patient was dispensed 1 pair of diabetic shoes and insoles were brought with the patient.   He tried on the reordered shoes, which was Apex X801 Men's 7.5 X-Wide and states that they felt too tight. He said he normally wears an 8. I explained to the patient that the shoe he tried on initially was an 8 X-Wide in the same style that he thought felt too big. He was under the impression that he was trying on a Men's 9.   The patient ended up taking the Apex X801 Men's 8 X-Wide with him today and we will return the 7.5 X-Wide.  Instructions for break-in and wear was reviewed and a copy was given to the patient.   Re-appointment for regularly scheduled diabetic foot care visits or if they should experience any trouble with the shoes or insoles.

## 2021-09-22 ENCOUNTER — Emergency Department (HOSPITAL_BASED_OUTPATIENT_CLINIC_OR_DEPARTMENT_OTHER)
Admission: EM | Admit: 2021-09-22 | Discharge: 2021-09-23 | Disposition: A | Discharge status: Home / Self Care | Payer: Medicare Other | Attending: Emergency Medicine | Admitting: Emergency Medicine

## 2021-09-22 ENCOUNTER — Encounter (HOSPITAL_BASED_OUTPATIENT_CLINIC_OR_DEPARTMENT_OTHER): Payer: Self-pay | Admitting: *Deleted

## 2021-09-22 ENCOUNTER — Other Ambulatory Visit: Payer: Self-pay

## 2021-09-22 ENCOUNTER — Emergency Department (HOSPITAL_BASED_OUTPATIENT_CLINIC_OR_DEPARTMENT_OTHER): Payer: Medicare Other

## 2021-09-22 DIAGNOSIS — E1122 Type 2 diabetes mellitus with diabetic chronic kidney disease: Secondary | ICD-10-CM | POA: Diagnosis not present

## 2021-09-22 DIAGNOSIS — Z7901 Long term (current) use of anticoagulants: Secondary | ICD-10-CM | POA: Diagnosis not present

## 2021-09-22 DIAGNOSIS — J45909 Unspecified asthma, uncomplicated: Secondary | ICD-10-CM | POA: Diagnosis not present

## 2021-09-22 DIAGNOSIS — I509 Heart failure, unspecified: Secondary | ICD-10-CM | POA: Diagnosis not present

## 2021-09-22 DIAGNOSIS — Z7952 Long term (current) use of systemic steroids: Secondary | ICD-10-CM | POA: Diagnosis not present

## 2021-09-22 DIAGNOSIS — Z91040 Latex allergy status: Secondary | ICD-10-CM | POA: Diagnosis not present

## 2021-09-22 DIAGNOSIS — Z7982 Long term (current) use of aspirin: Secondary | ICD-10-CM | POA: Diagnosis not present

## 2021-09-22 DIAGNOSIS — R062 Wheezing: Secondary | ICD-10-CM | POA: Diagnosis not present

## 2021-09-22 DIAGNOSIS — Z79899 Other long term (current) drug therapy: Secondary | ICD-10-CM | POA: Diagnosis not present

## 2021-09-22 DIAGNOSIS — I130 Hypertensive heart and chronic kidney disease with heart failure and stage 1 through stage 4 chronic kidney disease, or unspecified chronic kidney disease: Secondary | ICD-10-CM | POA: Diagnosis not present

## 2021-09-22 DIAGNOSIS — N184 Chronic kidney disease, stage 4 (severe): Secondary | ICD-10-CM | POA: Diagnosis not present

## 2021-09-22 DIAGNOSIS — R002 Palpitations: Secondary | ICD-10-CM | POA: Diagnosis present

## 2021-09-22 LAB — CBC
HCT: 26.8 % — ABNORMAL LOW (ref 39.0–52.0)
Hemoglobin: 8.6 g/dL — ABNORMAL LOW (ref 13.0–17.0)
MCH: 28 pg (ref 26.0–34.0)
MCHC: 32.1 g/dL (ref 30.0–36.0)
MCV: 87.3 fL (ref 80.0–100.0)
Platelets: 361 10*3/uL (ref 150–400)
RBC: 3.07 MIL/uL — ABNORMAL LOW (ref 4.22–5.81)
RDW: 16.4 % — ABNORMAL HIGH (ref 11.5–15.5)
WBC: 13.7 10*3/uL — ABNORMAL HIGH (ref 4.0–10.5)
nRBC: 0 % (ref 0.0–0.2)

## 2021-09-22 LAB — BASIC METABOLIC PANEL
Anion gap: 5 (ref 5–15)
BUN: 29 mg/dL — ABNORMAL HIGH (ref 6–20)
CO2: 20 mmol/L — ABNORMAL LOW (ref 22–32)
Calcium: 8.8 mg/dL — ABNORMAL LOW (ref 8.9–10.3)
Chloride: 115 mmol/L — ABNORMAL HIGH (ref 98–111)
Creatinine, Ser: 3.51 mg/dL — ABNORMAL HIGH (ref 0.61–1.24)
GFR, Estimated: 19 mL/min — ABNORMAL LOW (ref >60)
Glucose, Bld: 95 mg/dL (ref 70–99)
Potassium: 4.2 mmol/L (ref 3.5–5.1)
Sodium: 140 mmol/L (ref 135–145)

## 2021-09-22 MED ORDER — ALBUTEROL SULFATE (2.5 MG/3ML) 0.083% IN NEBU
5.0000 mg | INHALATION_SOLUTION | Freq: Once | RESPIRATORY_TRACT | Status: AC
  Administered 2021-09-22: 5 mg via RESPIRATORY_TRACT

## 2021-09-22 MED ORDER — ALBUTEROL SULFATE (2.5 MG/3ML) 0.083% IN NEBU
INHALATION_SOLUTION | RESPIRATORY_TRACT | Status: AC
  Filled 2021-09-22: qty 6

## 2021-09-22 MED ORDER — ALBUTEROL SULFATE HFA 108 (90 BASE) MCG/ACT IN AERS: 2.0000 | INHALATION_SPRAY | Freq: Four times a day (QID) | RESPIRATORY_TRACT | Status: DC

## 2021-09-22 NOTE — ED Triage Notes (Signed)
C/o "palpations and SOB "  x 1 hr ago , " because I drank too much caffeine

## 2021-09-22 NOTE — ED Notes (Signed)
Pt given cranberry juice.

## 2021-09-22 NOTE — ED Notes (Signed)
Pt given blanket and urinal per request

## 2021-09-22 NOTE — ED Notes (Signed)
Pt tachypnic in bed, prolonged expiratory. a/ox4. Speaking in full and complete sentences. Pt states he had a sudden onset of a-fib while at [facility], with palpitations and SOB. Pt has wheezes and crackles in all fields. HX asthma. Denies recent illness/fever.  VSS

## 2021-09-22 NOTE — ED Notes (Signed)
Pt given meal tray.

## 2021-09-22 NOTE — ED Provider Notes (Signed)
[facility] EMERGENCY DEPARTMENT Provider Note   CSN: [id number] Arrival date & time: 09/22/21  1821     History Chief Complaint  Patient presents with   Palpitations    Gary Buchanan is a 55 y.o. male.  Patient presenting actually with a complaint that he had palpitations and shortness of breath about an hour ago.  He thinks he drank too much caffeine.  He has a history of atrial fibrillation.  Patient is on Eliquis.  He states now that his heart feels better.  He was at [facility].  Normally followed by [facility] hospital.  Not normally seen by [place].  He is known to have a history of atrial fibrillation diastolic congestive heart failure chronic kidney disease stage IV asthma and obesity.  Patient with a recent admission at [facility] hospital August 23 through August 25.  Patient without any specific complaints now.      Past Medical History:  Diagnosis Date   A-fib ([place])    Asthma    Blindness of left eye    Chronic kidney disease    Diabetes mellitus without complication ([place])    Gynecomastia    Hypertension    Left knee pain     Patient Active Problem List   Diagnosis Date Noted   Low vision 11/15/2019   Bradycardia 08/24/2019   Other allergic rhinitis 08/24/2019   Non-compliance 04/26/2019   Acute on chronic respiratory failure with hypoxia and hypercapnia (HCC) 05/31/2018   Asthma exacerbation 11/09/2017   Chest pain 10/11/2017   CHF (congestive heart failure) ([place]) 10/11/2017   Bilateral chronic serous otitis media 09/23/2017   SOB (shortness of breath) 07/15/2017   Asthma 06/21/2017   Blindness of left eye 06/21/2017   New onset a-fib ([place]) 06/21/2017   Type 2 diabetes mellitus with complication, without long-term current use of insulin ([place]) 06/21/2017   Hypertension 06/21/2017   Obstructive sleep apnea syndrome 06/21/2017   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   Open angle with borderline findings and high glaucoma  risk in both eyes 07/11/2016   Hypertensive retinopathy 01/28/2016   Nuclear sclerotic cataract 01/28/2016   Presbyopia 01/28/2016   Ptosis of eyelid 01/28/2016   Mood disorder ([place]) 01/07/2015   Abnormal weight gain 12/11/2014   Hypercalcemia 12/11/2014   Muscle cramping 11/19/2014   Hyperprolactinemia ([place]) 10/10/2014   Male hypogonadism 10/10/2014   Chronic gout due to renal impairment, left ankle and foot, without tophus (tophi) 10/02/2014   Chronic gout of left foot due to renal impairment 10/02/2014   Chronic gout of right foot due to renal impairment 10/02/2014   Other gouty nephropathy 10/02/2014   Adrenal adenoma 07/23/2014   Gynecomastia 01/09/2014   Arthritis, multiple joint involvement 11/10/2013   Disorder of joint 11/10/2013   Anemia due to chronic kidney disease 01/31/2013   Chronic kidney disease, stage III (moderate) ([place]) 01/31/2013   Disorder of optic nerve 01/27/2013   Glaucoma suspect 01/27/2013   Nuclear senile cataract 01/27/2013   Optic atrophy 01/27/2013   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   Senile nuclear sclerosis 01/27/2013   Herpes dermatitis 11/30/2012   Herpes simplex complications 61/60/7371   Herpes simplex with complication 06/22/9484   Leg cramps 11/30/2012   Blepharitis 09/15/2012   Impairment level: one eye: total impairment: other eye: not specified 09/15/2012   Myogenic ptosis of eyelid 09/15/2012   Ptosis, myogenic 09/15/2012   Total impairment of one  eye 09/15/2012   Visual loss, one eye, no light perception (NLP) 09/15/2012   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[place]) 08/19/2012   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/19/2012   Chronic sinusitis 07/06/2012   Hypokalemia 07/06/2012   Hyperaldosteronism ([place]) 04/28/2012   Morbid obesity ([place]) 04/25/2012    Past Surgical History:  Procedure Laterality Date   ADRENALECTOMY     EYE SURGERY     HERNIA REPAIR     SINOSCOPY      SINUS ENDO WITH FUSION  10/17/2012   Procedure: SINUS ENDO WITH FUSION;  Surgeon: Ascencion Dike, MD;  Location: [facility];  Service: ENT;  Laterality: Bilateral;  Bilateral Ethmoidectomy,  Bilateral Maxillary Antrostomy, Bilateral Frontal Recess Exploration,   W/ Fusion Protocol       Family History  Problem Relation Age of Onset   Diabetes Mother    Hypertension Mother    Hyperlipidemia Mother    Diabetes Father    Hypertension Father    Diabetes Sister    Hypertension Sister    Diabetes Brother    Hypertension Brother    Allergic rhinitis Neg Hx    Asthma Neg Hx    Eczema Neg Hx    Urticaria Neg Hx     Social History   Tobacco Use   Smoking status: Never   Smokeless tobacco: Never  Vaping Use   Vaping Use: Never used  Substance Use Topics   Alcohol use: No   Drug use: No    Home Medications Prior to Admission medications   Medication Sig Start Date End Date Taking? Authorizing Provider  albuterol (PROVENTIL HFA;VENTOLIN HFA) 108 (90 Base) MCG/ACT inhaler Inhale 2 puffs into the lungs every 6 (six) hours as needed. 03/01/19   Cirigliano, Garvin Fila, DO  allopurinol (ZYLOPRIM) 100 MG tablet Take 200 mg by mouth daily. 09/13/19   [provider]  allopurinol (ZYLOPRIM) 300 MG tablet Take 1 tablet (300 mg total) by mouth daily. 12/09/18   CiriglianoGarvin Fila, DO  amiodarone (PACERONE) 200 MG tablet Take by mouth. 11/15/19   [provider]  amLODipine (NORVASC) 10 MG tablet Take 1 tablet (10 mg total) by mouth daily. 04/26/19   Cirigliano, Garvin Fila, DO  apixaban (ELIQUIS) 5 MG TABS tablet Take by mouth. 09/13/19   [provider]  aspirin EC 81 MG tablet Take 81 mg by mouth daily.    [provider]  Blood Glucose Monitoring Suppl (ACCU-CHEK AVIVA PLUS) w/Device KIT USE DAILY AS DIRECTED 12/17/17   Mayo, Pete Pelt, MD  BREO ELLIPTA 100-25 MCG/INH AEPB 1 puff daily. 11/15/19   [provider]  Brinzolamide-Brimonidine 1-0.2 %  SUSP Place 1 drop into the right eye 3 (three) times daily.    [provider]  budesonide (PULMICORT) 0.5 MG/2ML nebulizer solution Take 2 mLs (0.5 mg total) by nebulization 2 (two) times daily. 05/20/17   [provider]  cloNIDine (CATAPRES) 0.3 MG tablet Take by mouth. 04/30/17   [provider]  colchicine 0.6 MG tablet Take 0.6 mg by mouth daily. 12/05/19   [provider]  diclofenac sodium (VOLTAREN) 1 % GEL Apply 2 g topically 4 (four) times daily. 05/19/19   Cirigliano, Mary K, DO  ELIQUIS 5 MG TABS tablet Take 5 mg by mouth 2 (two) times daily. 11/21/19   [provider]  febuxostat (ULORIC) 40 MG tablet Take 40 mg by mouth daily. 12/05/19   [provider]  ferrous sulfate 325 (  65 FE) MG tablet Take 325 mg by mouth daily with breakfast.    [provider]  FLOVENT HFA 220 MCG/ACT inhaler TAKE 2 PUFFS BY MOUTH TWICE A DAY 02/29/20   Bobbitt, Sedalia Muta, MD  fluticasone (FLONASE) 50 MCG/ACT nasal spray Place 2 sprays into both nostrils daily. 04/26/19   Cirigliano, Mary K, DO  fluticasone furoate-vilanterol (BREO ELLIPTA) 100-25 MCG/INH AEPB Inhale into the lungs. 11/15/19   [provider]  folic acid (FOLVITE) 191 MCG tablet Take 400 mcg by mouth daily.    [provider]  furosemide (LASIX) 20 MG tablet TAKE 1 TABLET BY MOUTH EVERY MORNING FOR BLOOD PRESSURE AND FLUID RETENTION. Patient taking differently: Take 20 mg by mouth daily. FOR BLOOD PRESSURE AND FLUID RETENTION. 12/09/18   Cirigliano, Garvin Fila, DO  guaifenesin (ROBITUSSIN) 100 MG/5ML syrup Take by mouth.    [provider]  ipratropium-albuterol (DUONEB) 0.5-2.5 (3) MG/3ML SOLN Take 3 mLs by nebulization every 6 (six) hours as needed. 04/26/19   Cirigliano, Mary K, DO  Latanoprostene Bunod (VYZULTA) 0.024 % SOLN Place 1 drop into the right eye nightly.    [provider]  loratadine (CLARITIN) 10 MG tablet TAKE 1 TABLET BY MOUTH EVERY  DAY Patient taking differently: Take 10 mg by mouth daily.  05/10/19   Cirigliano, Garvin Fila, DO  losartan (COZAAR) 50 MG tablet Take 1 tablet (50 mg total) by mouth daily. 05/05/19   Cirigliano, Garvin Fila, DO  magnesium gluconate (MAGONATE) 500 MG tablet Take 1 tablet (500 mg total) by mouth daily. 12/09/18   Cirigliano, Garvin Fila, DO  magnesium oxide (MAG-OX) 400 MG tablet TK 1 T PO HS 03/11/16   [provider]  metoprolol tartrate (LOPRESSOR) 50 MG tablet Take 50 mg by mouth 2 (two) times daily. 11/15/19   [provider]  montelukast (SINGULAIR) 10 MG tablet Take 10 mg by mouth daily. 07/03/19   [provider]  Multiple Vitamins-Minerals (SENTRY SENIOR PO) Take 1 tablet by mouth daily.    [provider]  predniSONE (DELTASONE) 20 MG tablet 3 tabs po daily x 3 days, then 2 tabs x 3 days, then 1.5 tabs x 3 days, then 1 tab x 3 days, then 0.5 tabs x 3 days 08/25/19   Sherwood Gambler, MD  pyridOXINE (VITAMIN B-6) 100 MG tablet Take 100 mg by mouth daily.    [provider]  sitaGLIPtin (JANUVIA) 50 MG tablet Take 1 tablet (50 mg total) by mouth daily. 04/26/19   Cirigliano, Garvin Fila, DO  timolol (BETIMOL) 0.25 % ophthalmic solution Place 1-2 drops into the right eye 2 (two) times daily.    [provider]  timolol (TIMOPTIC) 0.25 % ophthalmic solution Place 1 drop into the left eye 2 (two) times daily. 01/17/20   [provider]  Tiotropium Bromide Monohydrate (SPIRIVA RESPIMAT) 1.25 MCG/ACT AERS Inhale 2 puffs into the lungs daily. 08/24/19   Bobbitt, Sedalia Muta, MD  Travoprost, BAK Free, (TRAVATAN) 0.004 % SOLN ophthalmic solution Administer 1 drop to both eyes nightly.    [provider]  valACYclovir (VALTREX) 1000 MG tablet Take 1 tablet (1,000 mg total) by mouth daily. 04/26/19   Cirigliano, Garvin Fila, DO  vitamin E (VITAMIN E) 200 UNIT capsule Take 200 Units by mouth daily.    [provider]  VYZULTA 0.024 % SOLN Apply 1 drop to  eye at bedtime. 12/31/19   [provider]    Allergies    Latex, Lisinopril, Ace  inhibitors, Gabapentin, Tizanidine, and Ipratropium-albuterol  Review of Systems   Review of Systems  Constitutional:  Negative for chills and fever.  HENT:  Negative for ear pain and sore throat.   Eyes:  Negative for pain and visual disturbance.  Respiratory:  Positive for shortness of breath. Negative for cough.   Cardiovascular:  Positive for palpitations and leg swelling. Negative for chest pain.  Gastrointestinal:  Negative for abdominal pain and vomiting.  Genitourinary:  Negative for dysuria and hematuria.  Musculoskeletal:  Negative for arthralgias and back pain.  Skin:  Negative for color change and rash.  Neurological:  Negative for seizures and syncope.  All other systems reviewed and are negative.  Physical Exam Updated Vital Signs BP (!) 144/78 (BP Location: Left Arm)   Pulse 85   Temp 98.3 F (36.8 C) (Oral)   Resp 18   Ht 1.575 m (5' 2" )   Wt 99.8 kg   SpO2 96%   BMI 40.24 kg/m   Physical Exam Vitals and nursing note reviewed.  Constitutional:      General: He is not in acute distress.    Appearance: Normal appearance. He is well-developed.  HENT:     Head: Normocephalic and atraumatic.  Eyes:     Conjunctiva/sclera: Conjunctivae normal.  Cardiovascular:     Rate and Rhythm: Normal rate and regular rhythm.     Heart sounds: No murmur heard. Pulmonary:     Effort: Pulmonary effort is normal. No respiratory distress.     Breath sounds: Wheezing present.  Abdominal:     Palpations: Abdomen is soft.     Tenderness: There is no abdominal tenderness.  Musculoskeletal:     Cervical back: Neck supple.     Right lower leg: Edema present.     Left lower leg: Edema present.  Skin:    General: Skin is warm and dry.     Capillary Refill: Capillary refill takes less than 2 seconds.  Neurological:     General: No focal deficit present.     Mental Status: He is alert  and oriented to person, place, and time.    ED Results / Procedures / Treatments   Labs (all labs ordered are listed, but only abnormal results are displayed) Labs Reviewed  BASIC METABOLIC PANEL - Abnormal; Notable for the following components:      Result Value   Chloride 115 (*)    CO2 20 (*)    BUN 29 (*)    Creatinine, Ser 3.51 (*)    Calcium 8.8 (*)    GFR, Estimated 19 (*)    All other components within normal limits  CBC - Abnormal; Notable for the following components:   WBC 13.7 (*)    RBC 3.07 (*)    Hemoglobin 8.6 (*)    HCT 26.8 (*)    RDW 16.4 (*)    All other components within normal limits    EKG EKG Interpretation  Date/Time:  Monday September 22 2021 18:28:39 EDT Ventricular Rate:  89 PR Interval:  188 QRS Duration: 74 QT Interval:  342 QTC Calculation: 416 R Axis:   76 Text Interpretation: Normal sinus rhythm Low voltage QRS Borderline ECG Confirmed by Fredia Sorrow [phone]) on 09/22/2021 9:51:30 PM  Radiology DG Chest 2 View  Result Date: 09/22/2021 CLINICAL DATA:  Shortness of breath.  Palpitations. EXAM: CHEST - 2 VIEW COMPARISON:  Chest x-ray 08/19/2021, CT chest 04/25/2020 FINDINGS: Prominent cardiac silhouette. The heart and mediastinal contours are unchanged. No focal  consolidation. No pulmonary edema. No pleural effusion. No pneumothorax. No acute osseous abnormality. IMPRESSION: No active cardiopulmonary disease. Electronically Signed   By: Iven Finn M.D.   On: 09/22/2021 19:34    Procedures Procedures   Medications Ordered in ED Medications  albuterol (PROVENTIL) (2.5 MG/3ML) 0.083% nebulizer solution (has no administration in time range)  albuterol (VENTOLIN HFA) 108 (90 Base) MCG/ACT inhaler 2 puff (has no administration in time range)  albuterol (PROVENTIL) (2.5 MG/3ML) 0.083% nebulizer solution 5 mg (5 mg Nebulization Given 09/22/21 1841)    ED Course  I have reviewed the triage vital signs and the nursing notes.  Pertinent  labs & imaging results that were available during my care of the patient were reviewed by me and considered in my medical decision making (see chart for details).    MDM Rules/Calculators/A&P                           Was able to review patient's labs from his most recent admission at [facility].  No significant change in his renal function.  He is plugged in with case management to continue to have him followed.  A little bit leukocytosis here white blood cell count 13.7.  Hemoglobin a little bit lower than baseline for him at 8.6.  Platelets are normal.  Patient does have bilateral wheezing.  Says he is almost out of his albuterol inhaler.  We will give him albuterol inhaler here to be stable for discharge home.  Chest x-ray here without any acute findings.  Cardiac monitoring here is sinus rhythm.  No palpitations. Final Clinical Impression(s) / ED Diagnoses Final diagnoses:  Palpitations  Wheezing  Stage 4 chronic kidney disease ([place])    Rx / DC Orders ED Discharge Orders     None        Fredia Sorrow, MD 09/23/21 0127

## 2021-09-22 NOTE — Discharge Instructions (Signed)
Use the albuterol inhaler 2 puffs every 6 hours.  Follow-up with your doctors.  In particular for your chronic kidney disease.  Continue your medications.  Return for rapid heart rate lasting 40 minutes or longer.  Heart rate has been under control here.

## 2021-10-16 ENCOUNTER — Ambulatory Visit (INDEPENDENT_AMBULATORY_CARE_PROVIDER_SITE_OTHER): Payer: Medicare Other | Admitting: Sports Medicine

## 2021-10-16 ENCOUNTER — Encounter: Payer: Self-pay | Admitting: Sports Medicine

## 2021-10-16 ENCOUNTER — Other Ambulatory Visit: Payer: Self-pay

## 2021-10-16 DIAGNOSIS — M79674 Pain in right toe(s): Secondary | ICD-10-CM | POA: Diagnosis not present

## 2021-10-16 DIAGNOSIS — M79675 Pain in left toe(s): Secondary | ICD-10-CM

## 2021-10-16 DIAGNOSIS — I739 Peripheral vascular disease, unspecified: Secondary | ICD-10-CM

## 2021-10-16 DIAGNOSIS — B351 Tinea unguium: Secondary | ICD-10-CM | POA: Diagnosis not present

## 2021-10-16 DIAGNOSIS — E119 Type 2 diabetes mellitus without complications: Secondary | ICD-10-CM

## 2021-10-16 NOTE — Progress Notes (Signed)
Subjective: Gary Buchanan is a 55 y.o. male patient with history of diabetes who presents to office today complaining of long,mildly painful nails  while ambulating in shoes; unable to trim. Patient states that the glucose reading this morning was 86.  And last A1c unknown.  No other pedal complaints noted.  Patient Active Problem List   Diagnosis Date Noted   Low vision 11/15/2019   Bradycardia 08/24/2019   Other allergic rhinitis 08/24/2019   Non-compliance 04/26/2019   Acute on chronic respiratory failure with hypoxia and hypercapnia (HCC) 05/31/2018   Asthma exacerbation 11/09/2017   Chest pain 10/11/2017   CHF (congestive heart failure) ([place]) 10/11/2017   Bilateral chronic serous otitis media 09/23/2017   SOB (shortness of breath) 07/15/2017   Asthma 06/21/2017   Blindness of left eye 06/21/2017   New onset a-fib ([place]) 06/21/2017   Type 2 diabetes mellitus with complication, without long-term current use of insulin ([place]) 06/21/2017   Hypertension 06/21/2017   Obstructive sleep apnea syndrome 06/21/2017   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 07/11/2016   Hypertensive retinopathy 01/28/2016   Nuclear sclerotic cataract 01/28/2016   Presbyopia 01/28/2016   Ptosis of eyelid 01/28/2016   Mood disorder ([place]) 01/07/2015   Abnormal weight gain 12/11/2014   Hypercalcemia 12/11/2014   Muscle cramping 11/19/2014   Hyperprolactinemia ([place]) 10/10/2014   Male hypogonadism 10/10/2014   Chronic gout due to renal impairment, left ankle and foot, without tophus (tophi) 10/02/2014   Chronic gout of left foot due to renal impairment 10/02/2014   Chronic gout of right foot due to renal impairment 10/02/2014   Other gouty nephropathy 10/02/2014   Adrenal adenoma 07/23/2014   Gynecomastia 01/09/2014   Arthritis, multiple joint involvement 11/10/2013   Disorder of joint 11/10/2013   Anemia due to chronic  kidney disease 01/31/2013   Chronic kidney disease, stage III (moderate) ([place]) 01/31/2013   Disorder of optic nerve 01/27/2013   Glaucoma suspect 01/27/2013   Nuclear senile cataract 01/27/2013   Optic atrophy 01/27/2013   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   Senile nuclear sclerosis 01/27/2013   Herpes dermatitis 11/30/2012   Herpes simplex complications 87/86/7672   Herpes simplex with complication 09/47/0962   Leg cramps 11/30/2012   Blepharitis 09/15/2012   Impairment level: one eye: total impairment: other eye: not specified 09/15/2012   Myogenic ptosis of eyelid 09/15/2012   Ptosis, myogenic 09/15/2012   Total impairment of one eye 09/15/2012   Visual loss, one eye, no light perception ([facility]) 09/15/2012   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     atorvastatin (LIPITOR) 10 MG tablet Take by mouth.     glucose blood ([employer] BLOOD GLUCOSE TEST) test strip Use 1 strip to check blood sugar dailyProdigy voice test strips DX: E11.8 and H54.40     metoprolol succinate (TOPROL-XL) 25 MG 24 hr tablet Take 1 tablet by mouth daily.     montelukast (SINGULAIR) 10 MG tablet Take by mouth.     torsemide (DEMADEX) 20 MG tablet Take by mouth.  albuterol (PROVENTIL) (2.5 MG/3ML) 0.083% nebulizer solution SMARTSIG:3 Milliliter(s) Via Nebulizer Every 4 Hours PRN     allopurinol (ZYLOPRIM) 100 MG tablet Take 200 mg by mouth daily.     allopurinol (ZYLOPRIM) 100 MG tablet Take by mouth.     allopurinol (ZYLOPRIM) 300 MG tablet Take 1 tablet (300 mg total) by mouth daily. 90 tablet 3   amiodarone (PACERONE) 200  MG tablet Take by mouth.     amLODipine (NORVASC) 10 MG tablet Take 1 tablet (10 mg total) by mouth daily. 90 tablet 3   amLODipine (NORVASC) 10 MG tablet Take by mouth.     amoxicillin-clavulanate (AUGMENTIN) 500-125 MG tablet SMARTSIG:1 Pill By Mouth Twice Daily     apixaban (ELIQUIS) 5 MG TABS tablet Take by mouth.     aspirin EC 81 MG tablet Take 81 mg by mouth daily.     Blood Glucose Monitoring Suppl (ACCU-CHEK AVIVA PLUS) w/Device KIT USE DAILY AS DIRECTED 1 kit 0   BREO ELLIPTA 100-25 MCG/INH AEPB 1 puff daily.     Brinzolamide-Brimonidine 1-0.2 % SUSP Place 1 drop into the right eye 3 (three) times daily.     budesonide (PULMICORT) 0.5 MG/2ML nebulizer solution Take 2 mLs (0.5 mg total) by nebulization 2 (two) times daily.     cloNIDine (CATAPRES) 0.3 MG tablet Take by mouth.     colchicine 0.6 MG tablet Take 0.6 mg by mouth daily.     diclofenac sodium (VOLTAREN) 1 % GEL Apply 2 g topically 4 (four) times daily. 100 g 3   diclofenac Sodium (VOLTAREN) 1 % GEL Apply 2 g topically 4 (four) times daily.     doxycycline (VIBRAMYCIN) 100 MG capsule Take 100 mg by mouth 2 (two) times daily.     ELIQUIS 5 MG TABS tablet Take 5 mg by mouth 2 (two) times daily.     febuxostat (ULORIC) 40 MG tablet Take 40 mg by mouth daily.     ferrous sulfate 325 (65 FE) MG tablet Take 325 mg by mouth daily with breakfast.     fexofenadine (ALLEGRA) 180 MG tablet Take by mouth.     FLOVENT HFA 220 MCG/ACT inhaler TAKE 2 PUFFS BY MOUTH TWICE A DAY 12 Inhaler 0   fluticasone (FLONASE) 50 MCG/ACT nasal spray Place 2 sprays into both nostrils daily. 16 g 5   fluticasone furoate-vilanterol (BREO ELLIPTA) 100-25 MCG/INH AEPB Inhale into the lungs.     folic acid (FOLVITE) 270 MCG tablet Take 400 mcg by mouth daily.     furosemide (LASIX) 20 MG tablet TAKE 1 TABLET BY MOUTH EVERY MORNING FOR BLOOD PRESSURE AND FLUID RETENTION. (Patient taking differently: Take 20 mg by mouth daily. FOR BLOOD PRESSURE AND FLUID  RETENTION.) 90 tablet 3   guaifenesin (ROBITUSSIN) 100 MG/5ML syrup Take by mouth.     ipratropium-albuterol (DUONEB) 0.5-2.5 (3) MG/3ML SOLN Take 3 mLs by nebulization every 6 (six) hours as needed. 360 mL 5   ketoconazole (NIZORAL) 2 % cream Apply topically.     Latanoprostene Bunod (VYZULTA) 0.024 % SOLN Place 1 drop into the right eye nightly.     loratadine (CLARITIN) 10 MG tablet TAKE 1 TABLET BY MOUTH EVERY DAY (Patient taking differently: Take 10 mg by mouth daily. ) 90 tablet 3   losartan (COZAAR) 50 MG tablet Take 1 tablet (50 mg total) by mouth daily. 90 tablet 3   magnesium gluconate (MAGONATE) 500 MG tablet Take 1 tablet (500 mg total) by mouth daily. 90 tablet  3   magnesium gluconate (MAGONATE) 500 MG tablet Take by mouth.     magnesium oxide (MAG-OX) 400 MG tablet TK 1 T PO HS     metoprolol tartrate (LOPRESSOR) 50 MG tablet Take 50 mg by mouth 2 (two) times daily.     montelukast (SINGULAIR) 10 MG tablet Take 10 mg by mouth daily.     Multiple Vitamins-Minerals (SENTRY SENIOR PO) Take 1 tablet by mouth daily.     pantoprazole (PROTONIX) 40 MG tablet Take 40 mg by mouth daily.     predniSONE (DELTASONE) 20 MG tablet 3 tabs po daily x 3 days, then 2 tabs x 3 days, then 1.5 tabs x 3 days, then 1 tab x 3 days, then 0.5 tabs x 3 days 27 tablet 0   pyridOXINE (VITAMIN B-6) 100 MG tablet Take 100 mg by mouth daily.     silver sulfADIAZINE (SILVADENE) 1 % cream SMARTSIG:Sparingly Topical Daily     sitaGLIPtin (JANUVIA) 50 MG tablet Take 1 tablet (50 mg total) by mouth daily. 90 tablet 3   timolol (BETIMOL) 0.25 % ophthalmic solution Place 1-2 drops into the right eye 2 (two) times daily.     timolol (TIMOPTIC) 0.25 % ophthalmic solution Place 1 drop into the left eye 2 (two) times daily.     Tiotropium Bromide Monohydrate (SPIRIVA RESPIMAT) 1.25 MCG/ACT AERS Inhale 2 puffs into the lungs daily. 4 g 5   Travoprost, BAK Free, (TRAVATAN) 0.004 % SOLN ophthalmic solution Administer 1 drop to  both eyes nightly.     valACYclovir (VALTREX) 1000 MG tablet Take 1 tablet (1,000 mg total) by mouth daily. 90 tablet 3   Vitamin D, Ergocalciferol, (DRISDOL) 1.25 MG (50000 UNIT) CAPS capsule Take 50,000 Units by mouth once a week.     vitamin E (VITAMIN E) 200 UNIT capsule Take 200 Units by mouth daily.     VYZULTA 0.024 % SOLN Apply 1 drop to eye at bedtime.     No current facility-administered medications on file prior to visit.   Allergies  Allergen Reactions   Latex Itching   Lisinopril Swelling    Other reaction(s): Other Lip swelling   Ace Inhibitors Swelling    Lips swells Other reaction(s): Other (see comments)   Gabapentin Other (See Comments)    Other reaction(s): Other (See Comments) Rapid heartbeat  Other reaction(s): Other Rapid heartbeat  Other reaction(s): Other (See Comments) Rapid heartbeat  Rapid heartbeat    Tizanidine Other (See Comments)    Other reaction(s): Other chest pain  Other reaction(s): Other (See Comments) Chest pain Other reaction(s): Other (See Comments) Chest pain chest pain    Ipratropium-Albuterol Palpitations    Recent Results (from the past 2160 hour(s))  Basic metabolic panel     Status: Abnormal   Collection Time: 09/22/21  7:20 PM  Result Value Ref Range   Sodium 140 135 - 145 mmol/L   Potassium 4.2 3.5 - 5.1 mmol/L   Chloride 115 (H) 98 - 111 mmol/L   CO2 20 (L) 22 - 32 mmol/L   Glucose, Bld 95 70 - 99 mg/dL    Comment: Glucose reference range applies only to samples taken after fasting for at least 8 hours.   BUN 29 (H) 6 - 20 mg/dL   Creatinine, Ser 3.51 (H) 0.61 - 1.24 mg/dL   Calcium 8.8 (L) 8.9 - 10.3 mg/dL   GFR, Estimated 19 (L) >60 mL/min    Comment: (NOTE) Calculated using the CKD-EPI Creatinine Equation (2021)  Anion gap 5 5 - 15    Comment: Performed at [facility], [address] [place], [place] [address]  CBC     Status: Abnormal   Collection Time: 09/22/21  7:20 PM  Result Value Ref  Range   WBC 13.7 (H) 4.0 - 10.5 K/uL   RBC 3.07 (L) 4.22 - 5.81 MIL/uL   Hemoglobin 8.6 (L) 13.0 - 17.0 g/dL   HCT 26.8 (L) 39.0 - 52.0 %   MCV 87.3 80.0 - 100.0 fL   MCH 28.0 26.0 - 34.0 pg   MCHC 32.1 30.0 - 36.0 g/dL   RDW 16.4 (H) 11.5 - 15.5 %   Platelets 361 150 - 400 K/uL   nRBC 0.0 0.0 - 0.2 %    Comment: Performed at [facility], [address] [place], [place] [address]    Objective: General: Patient is awake, alert, and oriented x 3 and in no acute distress.  Integument: Skin is warm, dry and supple bilateral. Nails are tender, long, thickened and  dystrophic with subungual debris, consistent with onychomycosis, 1-5 bilateral. No signs of infection. No open lesions or preulcerative lesions present bilateral. Remaining integument unremarkable.  Vasculature:  Dorsalis Pedis pulse 1/4 bilateral. Posterior Tibial pulse 0/4 bilateral.  Capillary fill time <5 sec 1-5 bilateral.  No hair growth to the level of the digits. Temperature gradient within normal limits.  Chronic edema noted to bilateral lower extremities with trophic skin changes bilateral worse on the left.  No varicosities present bilateral.  Neurology: Gross sensation present via light touch bilateral.  Musculoskeletal: Asymptomatic pes planus pedal deformities noted bilateral. Muscular strength 5/5 in all lower extremity muscular groups bilateral without pain on range of motion . No tenderness with calf compression bilateral.  Assessment and Plan: Problem List Items Addressed This Visit   None Visit Diagnoses     Pain due to onychomycosis of toenails of both feet    -  Primary   Relevant Medications   ketoconazole (NIZORAL) 2 % cream   Controlled type 2 diabetes mellitus without complication, without long-term current use of insulin (HCC)       Relevant Medications   aspirin 81 MG EC tablet   atorvastatin (LIPITOR) 10 MG tablet   PVD (peripheral vascular disease) (HCC)       Relevant  Medications   amLODipine (NORVASC) 10 MG tablet   aspirin 81 MG EC tablet   atorvastatin (LIPITOR) 10 MG tablet   metoprolol succinate (TOPROL-XL) 25 MG 24 hr tablet   torsemide (DEMADEX) 20 MG tablet        -Examined patient. -Mechanically debrided all nails 1-5 bilateral using sterile nail nipper and filed with dremel without incident  -Encourage elevation to assist with edema control and advised patient if skin continues to stay white or itchy on the left leg he should follow-up with his PCP or go see a dermatologist -Answered all patient questions -Patient to return  in 3 months for at risk foot care -Patient advised to call the office if any problems or questions arise in the meantime.  Landis Martins, DPM

## 2021-12-03 ENCOUNTER — Ambulatory Visit: Payer: Medicare Other | Admitting: Internal Medicine

## 2021-12-03 ENCOUNTER — Ambulatory Visit (INDEPENDENT_AMBULATORY_CARE_PROVIDER_SITE_OTHER): Payer: Medicare Other | Admitting: Medical-Surgical

## 2021-12-03 DIAGNOSIS — Z91199 Patient's noncompliance with other medical treatment and regimen due to unspecified reason: Secondary | ICD-10-CM

## 2021-12-03 NOTE — Progress Notes (Signed)
   Complete physical exam  Patient: Gary Buchanan   DOB: 10/17/2001   55 y.o. Male  MRN: [id number]  Subjective:    No chief complaint on file.   Gary Buchanan is a 55 y.o. male who presents today for a complete physical exam. She reports consuming a {diet types:17450} diet. {types:19826} She generally feels {DESC; WELL/FAIRLY WELL/POORLY:18703}. She reports sleeping {DESC; WELL/FAIRLY WELL/POORLY:18703}. She {does/does not:200015} have additional problems to discuss today.    Most recent fall risk assessment:    06/24/2022   10:42 AM  Fall Risk   Falls in the past year? 0  Number falls in past yr: 0  Injury with Fall? 0  Risk for fall due to : No Fall Risks  Follow up Falls evaluation completed     Most recent depression screenings:    06/24/2022   10:42 AM 05/15/2021   10:46 AM  PHQ 2/9 Scores  PHQ - 2 Score 0 0  PHQ- 9 Score 5     {VISON DENTAL STD PSA (Optional):27386}  {History (Optional):23778}  Patient Care Team: Lorrine Killilea, NP as PCP - General (Nurse Practitioner)   Outpatient Medications Prior to Visit  Medication Sig   fluticasone (FLONASE) 50 MCG/ACT nasal spray Place 2 sprays into both nostrils in the morning and at bedtime. After 7 days, reduce to once daily.   norgestimate-ethinyl estradiol (SPRINTEC 28) 0.25-35 MG-MCG tablet Take 1 tablet by mouth daily.   Nystatin POWD Apply liberally to affected area 2 times per day   spironolactone (ALDACTONE) 100 MG tablet Take 1 tablet (100 mg total) by mouth daily.   No facility-administered medications prior to visit.    ROS        Objective:     There were no vitals taken for this visit. {Vitals History (Optional):23777}  Physical Exam   No results found for any visits on 07/30/22. {Show previous labs (optional):23779}    Assessment & Plan:    Routine Health Maintenance and Physical Exam  Immunization History  Administered Date(s) Administered   DTaP 12/31/1999, 02/26/2000,  05/06/2000, 01/20/2001, 08/05/2004   Hepatitis A 06/01/2008, 06/07/2009   Hepatitis B 10/18/1999, 11/25/1999, 05/06/2000   HiB (PRP-OMP) 12/31/1999, 02/26/2000, 05/06/2000, 01/20/2001   IPV 12/31/1999, 02/26/2000, 10/25/2000, 08/05/2004   Influenza,inj,Quad PF,6+ Mos 09/07/2014   Influenza-Unspecified 12/07/2012   MMR 10/25/2001, 08/05/2004   Meningococcal Polysaccharide 06/06/2012   Pneumococcal Conjugate-13 01/20/2001   Pneumococcal-Unspecified 05/06/2000, 07/20/2000   Tdap 06/06/2012   Varicella 10/25/2000, 06/01/2008    Health Maintenance  Topic Date Due   HIV Screening  Never done   Hepatitis C Screening  Never done   INFLUENZA VACCINE  07/28/2022   PAP-Cervical Cytology Screening  07/30/2022 (Originally 10/16/2020)   PAP SMEAR-Modifier  07/30/2022 (Originally 10/16/2020)   TETANUS/TDAP  07/30/2022 (Originally 06/06/2022)   HPV VACCINES  Discontinued   COVID-19 Vaccine  Discontinued    Discussed health benefits of physical activity, and encouraged her to engage in regular exercise appropriate for her age and condition.  Problem List Items Addressed This Visit   None Visit Diagnoses     Annual physical exam    -  Primary   Cervical cancer screening       Need for Tdap vaccination          No follow-ups on file.     Draco Malczewski, NP   

## 2021-12-11 ENCOUNTER — Encounter (HOSPITAL_BASED_OUTPATIENT_CLINIC_OR_DEPARTMENT_OTHER): Payer: Medicare Other | Admitting: Internal Medicine

## 2022-01-30 ENCOUNTER — Ambulatory Visit: Payer: Medicare Other | Admitting: Nurse Practitioner

## 2022-02-23 ENCOUNTER — Telehealth: Payer: Self-pay

## 2022-02-23 NOTE — Telephone Encounter (Signed)
Pt left vm stating that his shoes were starting to come apart and sometimes made him feel as if he was going to trip when walking. I offered him an appointment to come in to see Aaron Edelman but he stated that he will give me a call back at a later time to get scheduled.

## 2022-04-16 ENCOUNTER — Ambulatory Visit: Payer: Medicare Other | Admitting: Sports Medicine

## 2022-05-20 ENCOUNTER — Encounter (HOSPITAL_BASED_OUTPATIENT_CLINIC_OR_DEPARTMENT_OTHER): Payer: Medicare Other | Admitting: General Surgery

## 2022-06-22 ENCOUNTER — Encounter (HOSPITAL_BASED_OUTPATIENT_CLINIC_OR_DEPARTMENT_OTHER): Payer: Medicare Other | Attending: Internal Medicine | Admitting: Internal Medicine

## 2022-06-22 DIAGNOSIS — I872 Venous insufficiency (chronic) (peripheral): Secondary | ICD-10-CM | POA: Diagnosis not present

## 2022-06-22 DIAGNOSIS — G4733 Obstructive sleep apnea (adult) (pediatric): Secondary | ICD-10-CM | POA: Diagnosis not present

## 2022-06-22 DIAGNOSIS — E1122 Type 2 diabetes mellitus with diabetic chronic kidney disease: Secondary | ICD-10-CM | POA: Diagnosis not present

## 2022-06-22 DIAGNOSIS — I890 Lymphedema, not elsewhere classified: Secondary | ICD-10-CM | POA: Insufficient documentation

## 2022-06-22 DIAGNOSIS — Z794 Long term (current) use of insulin: Secondary | ICD-10-CM | POA: Insufficient documentation

## 2022-06-22 DIAGNOSIS — N184 Chronic kidney disease, stage 4 (severe): Secondary | ICD-10-CM | POA: Diagnosis not present

## 2022-07-21 ENCOUNTER — Ambulatory Visit: Payer: Medicare Other | Admitting: Internal Medicine

## 2022-07-21 ENCOUNTER — Telehealth: Payer: Self-pay

## 2022-07-21 NOTE — Telephone Encounter (Signed)
Open in error

## 2022-07-23 ENCOUNTER — Ambulatory Visit (INDEPENDENT_AMBULATORY_CARE_PROVIDER_SITE_OTHER): Payer: Medicare Other | Admitting: Allergy & Immunology

## 2022-07-23 ENCOUNTER — Other Ambulatory Visit: Payer: Self-pay

## 2022-07-23 ENCOUNTER — Encounter: Payer: Self-pay | Admitting: Allergy & Immunology

## 2022-07-23 VITALS — BP 140/78 | HR 90 | Temp 98.6°F | Resp 21 | Ht 61.5 in | Wt 224.8 lb

## 2022-07-23 DIAGNOSIS — J4540 Moderate persistent asthma, uncomplicated: Secondary | ICD-10-CM

## 2022-07-23 DIAGNOSIS — J310 Chronic rhinitis: Secondary | ICD-10-CM | POA: Diagnosis not present

## 2022-07-23 DIAGNOSIS — L2089 Other atopic dermatitis: Secondary | ICD-10-CM | POA: Diagnosis not present

## 2022-07-23 MED ORDER — CLOBETASOL PROPIONATE 0.05 % EX OINT: 1.0000 | TOPICAL_OINTMENT | Freq: Two times a day (BID) | CUTANEOUS | 5 refills | Status: DC

## 2022-07-23 MED ORDER — METHYLPREDNISOLONE ACETATE 80 MG/ML IJ SUSP
80.0000 mg | Freq: Once | INTRAMUSCULAR | Status: AC
  Administered 2022-07-23: 80 mg via INTRAMUSCULAR

## 2022-07-23 MED ORDER — TRELEGY ELLIPTA 100-62.5-25 MCG/ACT IN AEPB: 1.0000 | INHALATION_SPRAY | Freq: Every day | RESPIRATORY_TRACT | 5 refills | Status: AC

## 2022-07-23 MED ORDER — METHYLPREDNISOLONE ACETATE 80 MG/ML IJ SUSP: 80.0000 mg | Freq: Once | INTRAMUSCULAR | Status: DC

## 2022-07-23 NOTE — Patient Instructions (Addendum)
1. Moderate persistent asthma, uncomplicated - Your lung function was in the 40% range and it did improve with albuterol.  - It did improve with the albuterol treatment. - DepoMedrol provided with a prednisone taper. - Daily controller medication(s): Trelegy 100/62.5/25 one puff once daily - Prior to physical activity: Xopenex 2 puffs 10-15 minutes before physical activity. - Rescue medications: Xopenex 4 puffs every 4-6 hours as needed - Asthma control goals:  * Full participation in all desired activities (may need albuterol before activity) * Albuterol use two time or less a week on average (not counting use with activity) * Cough interfering with sleep two time or less a month * Oral steroids no more than once a year * No hospitalizations  2. Chronic rhinitis - We are going to get some environmental allergy testing via the blood. - We will call you in 1-2 weeks with the results of the testing. - Continue with Singulair (montelukast) 10 mg daily. - Add on Zyrtec (cetirizine) '10mg'$  daily.   3. Eczema on the left left - Add on clobetasol ointment to use 3 times daily for 1-2 weeks and then as needed.  4. Return in about 4 weeks (around 08/20/2022) in the [facility] office.    Please inform us of any Emergency Department visits, hospitalizations, or changes in symptoms. Call us before going to the ED for breathing or allergy symptoms since we might be able to fit you in for a sick visit. Feel free to contact us anytime with any questions, problems, or concerns.  It was a pleasure to meet you today!  Websites that have reliable patient information: 1. American Academy of Asthma, Allergy, and Immunology: www.aaaai.org 2. Food Allergy Research and Education (FARE): foodallergy.org 3. Mothers of Asthmatics: http://www.asthmacommunitynetwork.org 4. American College of Allergy, Asthma, and Immunology: www.acaai.org   COVID-19 Vaccine Information can be found at:  [email or web address].co.uk For questions related to vaccine distribution or appointments, please email [email or web address] or call [phone].   We realize that you might be concerned about having an allergic reaction to the COVID19 vaccines. To help with that concern, WE ARE OFFERING THE COVID19 VACCINES IN OUR OFFICE! Ask the front desk for dates!     "Like" [place] on Facebook and Instagram for our latest updates!      A healthy democracy works best when [employer] participate! Make sure you are registered to vote! If you have moved or changed any of your contact information, you will need to get this updated before voting!  In some cases, you MAY be able to register to vote online: [email or web address]

## 2022-07-23 NOTE — Progress Notes (Signed)
FOLLOW UP  Date of Service/Encounter:  07/23/22   Assessment:   Moderate persistent asthma, uncomplicated - with very poor FEV1 today   Allergic rhinitis - needs testing at some point (but sending blood work today)  Stage IV chronic kidney disease  Anemia of chronic disease - sees Dr. Marcy Panning  Plan/Recommendations:    1. Moderate persistent asthma, uncomplicated - Your lung function was in the 40% range and it did improve with albuterol.  - It did improve with the albuterol treatment. - DepoMedrol provided with a prednisone taper. - Daily controller medication(s): Trelegy 100/62.5/25 one puff once daily - Prior to physical activity: Xopenex 2 puffs 10-15 minutes before physical activity. - Rescue medications: Xopenex 4 puffs every 4-6 hours as needed - Asthma control goals:  * Full participation in all desired activities (may need albuterol before activity) * Albuterol use two time or less a week on average (not counting use with activity) * Cough interfering with sleep two time or less a month * Oral steroids no more than once a year * No hospitalizations  2. Chronic rhinitis - We are going to get some environmental allergy testing via the blood. - We will call you in 1-2 weeks with the results of the testing. - Continue with Singulair (montelukast) 10 mg daily. - Add on Zyrtec (cetirizine) '10mg'$  daily.   3. Eczema on the left left - Add on clobetasol ointment to use 3 times daily for 1-2 weeks and then as needed.  4. Return in about 4 weeks (around 08/20/2022) in the [facility] office.     Subjective:   Gary Buchanan is a 56 y.o. male presenting today for follow up of  Chief Complaint  Patient presents with   Asthma    Gary Buchanan has a history of the following: Patient Active Problem List   Diagnosis Date Noted   Low vision 11/15/2019   Bradycardia 08/24/2019   Other allergic rhinitis 08/24/2019   Non-compliance 04/26/2019   Acute on chronic  respiratory failure with hypoxia and hypercapnia ([place]) 05/31/2018   Asthma exacerbation 11/09/2017   Chest pain 10/11/2017   CHF (congestive heart failure) ([place]) 10/11/2017   Bilateral chronic serous otitis media 09/23/2017   SOB (shortness of breath) 07/15/2017   Asthma 06/21/2017   Blindness of left eye 06/21/2017   New onset a-fib ([place]) 06/21/2017   Type 2 diabetes mellitus with complication, without long-term current use of insulin ([place]) 06/21/2017   Hypertension 06/21/2017   Obstructive sleep apnea syndrome 06/21/2017   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 07/11/2016   Hypertensive retinopathy 01/28/2016   Nuclear sclerotic cataract 01/28/2016   Presbyopia 01/28/2016   Ptosis of eyelid 01/28/2016   Mood disorder ([place]) 01/07/2015   Abnormal weight gain 12/11/2014   Hypercalcemia 12/11/2014   Muscle cramping 11/19/2014   Hyperprolactinemia ([place]) 10/10/2014   Male hypogonadism 10/10/2014   Chronic gout due to renal impairment, left ankle and foot, without tophus (tophi) 10/02/2014   Chronic gout of left foot due to renal impairment 10/02/2014   Chronic gout of right foot due to renal impairment 10/02/2014   Other gouty nephropathy 10/02/2014   Adrenal adenoma 07/23/2014   Gynecomastia 01/09/2014   Arthritis, multiple joint involvement 11/10/2013   Disorder of joint 11/10/2013   Anemia due to chronic kidney disease 01/31/2013   Chronic kidney disease, stage III (moderate) ([place]) 01/31/2013   Disorder of optic  nerve 01/27/2013   Glaucoma suspect 01/27/2013   Nuclear senile cataract 01/27/2013   Optic atrophy 01/27/2013   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   Senile nuclear sclerosis 01/27/2013   Herpes dermatitis 11/30/2012   Herpes simplex complications 13/24/4010   Herpes simplex with complication 27/25/3664   Leg cramps 11/30/2012    Blepharitis 09/15/2012   Impairment level: one eye: total impairment: other eye: not specified 09/15/2012   Myogenic ptosis of eyelid 09/15/2012   Ptosis, myogenic 09/15/2012   Total impairment of one eye 09/15/2012   Visual loss, one eye, no light perception ([place]) 09/15/2012   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  Gary Buchanan is a 56 y.o. male presenting for a follow up visit.  He was last seen in August 2020 by Dr. Verlin Fester, who is no longer with the practice.  At that time, he was evaluated as a new patient.  His asthma is under poor control.  He was started on prednisone and started on Flovent as well as Spiriva.  For his allergic rhinitis, he did not have skin testing done because of his poor pulmonary function.   Since the last visit, he has not done well. He tells me that his apartment is "contaminated" and his apartment is next to the trash area. He has been there since January 2023. He apparently got some of his care at [facility] and [place].   Asthma/Respiratory Symptom History: He is only using albuterol to control his symptoms. He goes through albuterol around one inhaler every two months. He is currently on Qvar.  He reports that he got a new one around 3 months ago. I am confused by this, since Qvar has been off the market for around one year. He was on Breo and Trelegy, although he tells me that Medicare did not cover any of those medications. 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 There was a lot of dust in the air. This is literally the first time that he had an asthma  exacerbation.   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  He does not drive on his own.  He has a history of kidney issues as well, including stage 4 chronic kidney disease.  Allergic Rhinitis Symptom History: He never had any skin testing performed because his breathing was so terrible.  He does not have much in the way of allergic rhinitis symptoms, occasionally some throat drainage.  Skin Symptom History: He has a lesion on his left leg which is felt to be eczema.  It is quite pruritic.  He has not tried any creams on it as well. He also has an area behind his ear that is quite pruritic.  Otherwise, there have been no changes to his past medical history, surgical history, family history, or social history.    Review of Systems  Constitutional: Negative.  Negative for chills, fever, malaise/fatigue and weight loss.  HENT:  Positive for congestion. Negative for ear discharge, ear pain and sinus pain.  Eyes:  Negative for pain, discharge and redness.  Respiratory:  Positive for cough and shortness of breath. Negative for sputum production and wheezing.   Cardiovascular: Negative.  Negative for chest pain and palpitations.  Gastrointestinal:  Negative for abdominal pain, constipation, diarrhea, heartburn, nausea and vomiting.  Skin: Negative.  Negative for itching and rash.  Neurological:  Negative for dizziness and headaches.  Endo/Heme/Allergies:  Positive for environmental allergies. Does not bruise/bleed easily.       Objective:   Blood pressure 140/78, pulse 90, temperature 98.6 F (37 C), temperature source Temporal, resp. rate (!) 21, height 5' 1.5" (1.562 m), weight 224 lb 12.8 oz (102 kg), SpO2 95 %. Body mass index is 41.79 kg/m.    Physical Exam Vitals reviewed.  Constitutional:      Appearance: He is well-developed.  HENT:     Head: Normocephalic and atraumatic.     Right Ear: Tympanic membrane, ear canal and external ear normal.      Left Ear: Tympanic membrane, ear canal and external ear normal.     Nose: No nasal deformity, septal deviation, mucosal edema or rhinorrhea.     Right Turbinates: Enlarged. Not swollen.     Left Turbinates: Enlarged. Not swollen.     Right Sinus: No maxillary sinus tenderness or frontal sinus tenderness.     Left Sinus: No maxillary sinus tenderness or frontal sinus tenderness.     Comments: Enlarged turbinates.    Mouth/Throat:     Mouth: Mucous membranes are not pale and not dry.     Pharynx: Uvula midline.  Eyes:     General: Lids are normal. No allergic shiner.       Right eye: No discharge.        Left eye: No discharge.     Conjunctiva/sclera: Conjunctivae normal.     Right eye: Right conjunctiva is not injected. No chemosis.    Left eye: Left conjunctiva is not injected. No chemosis.    Pupils: Pupils are equal, round, and reactive to light.  Cardiovascular:     Rate and Rhythm: Normal rate and regular rhythm.     Heart sounds: Normal heart sounds.  Pulmonary:     Effort: Pulmonary effort is normal. No tachypnea, accessory muscle usage or respiratory distress.     Breath sounds: Decreased air movement present. No wheezing, rhonchi or rales.     Comments: Decreased air movement at the bases.  No wheezing or crackles. Chest:     Chest wall: No tenderness.  Lymphadenopathy:     Cervical: No cervical adenopathy.  Skin:    Coloration: Skin is not pale.     Findings: No abrasion, erythema, petechiae or rash. Rash is not papular, urticarial or vesicular.  Neurological:     Mental Status: He is alert.  Psychiatric:        Behavior: Behavior is cooperative.      Diagnostic studies:   Spirometry: results abnormal (FEV1: 0.91/40%, FVC: 3.40/118%, FEV1/FVC: 27%).    Spirometry consistent with severe obstructive disease. Xopenex four puffs via MDI treatment given in clinic with significant improvement in FEV1 per ATS criteria.  However, his FVC decreased from 119% to 59%.   His pattern became more restrictive.   Allergy Studies: none (labs sent instead)      Salvatore Marvel, MD  [facility]

## 2022-07-29 LAB — ALPHA-1-ANTITRYPSIN: A-1 Antitrypsin: 130 mg/dL (ref 101–187)

## 2022-07-29 LAB — ALLERGENS W/COMP RFLX AREA 2
Alternaria Alternata IgE: <0.1 kU/L
Aspergillus Fumigatus IgE: <0.1 kU/L
Bermuda Grass IgE: <0.1 kU/L
Cedar, Mountain IgE: <0.1 kU/L
Cladosporium Herbarum IgE: <0.1 kU/L
Cockroach, German IgE: <0.1 kU/L
Common Silver Birch IgE: <0.1 kU/L
Cottonwood IgE: <0.1 kU/L
D Farinae IgE: 0.2 kU/L — AB
D Pteronyssinus IgE: 0.17 kU/L — AB
E001-IgE Cat Dander: <0.1 kU/L
E005-IgE Dog Dander: <0.1 kU/L
Elm, American IgE: <0.1 kU/L
IgE (Immunoglobulin E), Serum: 2039 IU/mL — ABNORMAL HIGH (ref 6–495)
Johnson Grass IgE: 0.12 kU/L — AB
Maple/Box Elder IgE: <0.1 kU/L
Mouse Urine IgE: <0.1 kU/L
Oak, White IgE: <0.1 kU/L
Pecan, Hickory IgE: <0.1 kU/L
Penicillium Chrysogen IgE: <0.1 kU/L
Pigweed, Rough IgE: <0.1 kU/L
Ragweed, Short IgE: <0.1 kU/L
Sheep Sorrel IgE Qn: <0.1 kU/L
Timothy Grass IgE: <0.1 kU/L
White Mulberry IgE: <0.1 kU/L

## 2022-07-29 LAB — ANCA TITERS
Atypical pANCA: <1:20 {titer}
C-ANCA: <1:20 {titer}
P-ANCA: <1:20 {titer}

## 2022-07-29 LAB — ALLERGEN PROFILE, MOLD
Aureobasidi Pullulans IgE: <0.1 kU/L
Candida Albicans IgE: <0.1 kU/L
M009-IgE Fusarium proliferatum: <0.1 kU/L
M014-IgE Epicoccum purpur: <0.1 kU/L
Mucor Racemosus IgE: <0.1 kU/L
Phoma Betae IgE: 0.1 kU/L — AB
Setomelanomma Rostrat: <0.1 kU/L
Stemphylium Herbarum IgE: <0.1 kU/L

## 2022-07-29 LAB — CBC WITH DIFFERENTIAL/PLATELET
Basophils Absolute: 0 10*3/uL (ref 0.0–0.2)
Basos: 0 %
EOS (ABSOLUTE): 0.1 10*3/uL (ref 0.0–0.4)
Eos: 1 %
Hematocrit: 25.3 % — ABNORMAL LOW (ref 37.5–51.0)
Hemoglobin: 7.9 g/dL — ABNORMAL LOW (ref 13.0–17.7)
Immature Grans (Abs): 0.1 10*3/uL (ref 0.0–0.1)
Immature Granulocytes: 1 %
Lymphocytes Absolute: 1.1 10*3/uL (ref 0.7–3.1)
Lymphs: 9 %
MCH: 28 pg (ref 26.6–33.0)
MCHC: 31.2 g/dL — ABNORMAL LOW (ref 31.5–35.7)
MCV: 90 fL (ref 79–97)
Monocytes Absolute: 1.3 10*3/uL — ABNORMAL HIGH (ref 0.1–0.9)
Monocytes: 11 %
Neutrophils Absolute: 8.9 10*3/uL — ABNORMAL HIGH (ref 1.4–7.0)
Neutrophils: 78 %
Platelets: 305 10*3/uL (ref 150–450)
RBC: 2.82 x10E6/uL — ABNORMAL LOW (ref 4.14–5.80)
RDW: 17.4 % — ABNORMAL HIGH (ref 11.6–15.4)
WBC: 11.5 10*3/uL — ABNORMAL HIGH (ref 3.4–10.8)

## 2022-07-29 LAB — ASPERGILLUS PRECIPITINS
A.Fumigatus #1 Abs: NEGATIVE
Aspergillus Flavus Antibodies: NEGATIVE
Aspergillus Niger Antibodies: NEGATIVE
Aspergillus glaucus IgG: NEGATIVE
Aspergillus nidulans IgG: NEGATIVE
Aspergillus terreus IgG: NEGATIVE

## 2022-07-30 NOTE — Progress Notes (Signed)
Called patient & lvm to schedule follow up visit.

## 2022-08-04 ENCOUNTER — Ambulatory Visit: Payer: Medicare Other | Admitting: Internal Medicine

## 2022-08-14 ENCOUNTER — Ambulatory Visit: Payer: Medicare Other | Admitting: Internal Medicine

## 2022-09-09 NOTE — Progress Notes (Signed)
RYETT, HAMMAN ([id number]) Visit Report for 06/22/2022 Chief Complaint Document Details Patient Name: Date of Service: RA Gerri Spore 06/22/2022 8:00 A M Medical Record Number: [id number] Patient Account Number: [id number] Date of Birth/Sex: Treating RN: 1966/10/06 (56 y.o. Erie Noe Primary Care Provider: PCP, NO Other Clinician: Referring Provider: Treating Provider/Extender: Yaakov Guthrie in Treatment: 0 Information Obtained from: Patient Chief Complaint 06/22/2022; history of wounds to his left lower extremity Electronic Signature(s) Signed: 06/23/2022 10:01:53 AM By: Kalman Shan DO Entered By: Kalman Shan on 06/22/2022 16:43:19 -------------------------------------------------------------------------------- HPI Details Patient Name: Date of Service: RA TLIFF, Jaymes Graff 06/22/2022 8:00 A M Medical Record Number: [id number] Patient Account Number: [id number] Date of Birth/Sex: Treating RN: 04-Jul-1966 (56 y.o. Erie Noe Primary Care Provider: PCP, NO Other Clinician: Referring Provider: Treating Provider/Extender: Yaakov Guthrie in Treatment: 0 History of Present Illness HPI Description: 06/22/2022 Mr. Aleksi Brummet is a 56 year old male with a past medical history of OSA, insulin-dependent type 2 diabetes, CKD stage IV, venous insufficiency/lymphedema that presents to the clinic for history of open wounds to his left lower extremity. He has no open wounds today. He states he has compression stockings but does not wear these daily. He would like to know if there is anything further to do for his lower extremity swelling. Electronic Signature(s) Signed: 06/23/2022 10:01:53 AM By: Kalman Shan DO Entered By: Kalman Shan on 06/22/2022 16:44:40 -------------------------------------------------------------------------------- Physical Exam Details Patient Name: Date of Service: RA Gerri Spore 06/22/2022 8:00 A M Medical Record Number:  [id number] Patient Account Number: [id number] Date of Birth/Sex: Treating RN: [birth year] (56 y.o. Erie Noe Primary Care Provider: PCP, NO Other Clinician: Referring Provider: Treating Provider/Extender: Yaakov Guthrie in Treatment: 0 Constitutional respirations regular, non-labored and within target range for patient.. Cardiovascular 2+ dorsalis pedis/posterior tibialis pulses. Psychiatric pleasant and cooperative. Notes Lymphedema skin changes to the lower extremities bilaterally. 2+ pitting edema to the knee. No open wounds. No surrounding signs of infection. Electronic Signature(s) Signed: 06/23/2022 10:01:53 AM By: Kalman Shan DO Entered By: Kalman Shan on 06/22/2022 16:45:07 -------------------------------------------------------------------------------- Physician Orders Details Patient Name: Date of Service: RA TLIFF, Jaymes Graff 06/22/2022 8:00 A M Medical Record Number: [id number] Patient Account Number: [id number] Date of Birth/Sex: Treating RN: 1966/06/30 (55 y.o. Erie Noe Primary Care Provider: PCP, NO Other Clinician: Referring Provider: Treating Provider/Extender: Yaakov Guthrie in Treatment: 0 Verbal / Phone Orders: No Diagnosis Coding Discharge From [facility] Services Discharge from [place] Edema Control - Lymphedema / SCD / Other Elevate legs to the level of the heart or above for 30 minutes daily and/or when sitting, a frequency of: Avoid standing for long periods of time. Patient to wear own compression stockings every day. Moisturize legs daily. Custom Services Referral to lymphedema clinic Electronic Signature(s) Signed: 06/23/2022 10:01:53 AM By: Kalman Shan DO Entered By: Kalman Shan on 06/22/2022 16:45:15 Prescription 06/22/2022 -------------------------------------------------------------------------------- Tyminski, Manon Hilding DO Patient Name: Provider: Jan 08, 1966 [id number] Date of Birth:  NPI#Levi Aland Sex: DEA #: [id number] [phone] Phone #: License #: [place] Patient Address: [address] [address] , [address] [place], [place] [address] [phone] Allergies lisinopril; latex; ACE Inhibitors; gabapentin; tizanidine; ipratropium Provider's Orders Referral to lymphedema clinic Hand Signature: Date(s): Electronic Signature(s) Signed: 06/23/2022 10:01:53 AM By: Kalman Shan DO Entered By: Kalman Shan on 06/22/2022 16:45:15 -------------------------------------------------------------------------------- Problem List Details Patient Name: Date of Service: RA TLIFF, Jaymes Graff  06/22/2022 8:00 A M Medical Record Number: [id number] Patient Account Number: [id number] Date of Birth/Sex: Treating RN: Dec 06, 1966 (56 y.o. Erie Noe Primary Care Provider: PCP, NO Other Clinician: Referring Provider: Treating Provider/Extender: Yaakov Guthrie in Treatment: 0 Active Problems ICD-10 Encounter Code Description Active Date MDM Diagnosis I89.0 Lymphedema, not elsewhere classified 06/22/2022 No Yes I87.2 Venous insufficiency (chronic) (peripheral) 06/22/2022 No Yes N18.4 Chronic kidney disease, stage 4 (severe) 06/22/2022 No Yes Inactive Problems Resolved Problems Electronic Signature(s) Signed: 06/23/2022 10:01:53 AM By: Kalman Shan DO Entered By: Kalman Shan on 06/22/2022 16:42:50 -------------------------------------------------------------------------------- Progress Note Details Patient Name: Date of Service: RA TLIFF, Jaymes Graff 06/22/2022 8:00 A M Medical Record Number: [id number] Patient Account Number: [id number] Date of Birth/Sex: Treating RN: 12-17-1966 (56 y.o. Erie Noe Primary Care Provider: PCP, NO Other Clinician: Referring Provider: Treating Provider/Extender: Yaakov Guthrie in Treatment: 0 Subjective Chief  Complaint Information obtained from Patient 06/22/2022; history of wounds to his left lower extremity History of Present Illness (HPI) 06/22/2022 Mr. Jobin Montelongo is a 56 year old male with a past medical history of OSA, insulin-dependent type 2 diabetes, CKD stage IV, venous insufficiency/lymphedema that presents to the clinic for history of open wounds to his left lower extremity. He has no open wounds today. He states he has compression stockings but does not wear these daily. He would like to know if there is anything further to do for his lower extremity swelling. Patient History Information obtained from Patient, Chart. Allergies lisinopril, latex, ACE Inhibitors, gabapentin, tizanidine, ipratropium Family History Unknown History. Social History Never smoker, Marital Status - Single, Alcohol Use - Never, Drug Use - No History, Caffeine Use - Rarely. Medical History Eyes Denies history of Cataracts, Glaucoma, Optic Neuritis Ear/Nose/Mouth/Throat Denies history of Chronic sinus problems/congestion, Middle ear problems Respiratory Patient has history of Asthma Cardiovascular Patient has history of Arrhythmia - A-fibb, Hypertension, Peripheral Venous Disease Endocrine Patient has history of Type II Diabetes Hospitalization/Surgery History - sinus endo with fusion. - hernia repair. - adrenalectomy. Medical A Surgical History Notes nd Eyes blind left eye Review of Systems (ROS) Constitutional Symptoms (General Health) Denies complaints or symptoms of Fatigue, Fever, Chills, Marked Weight Change. Eyes Denies complaints or symptoms of Dry Eyes, Vision Changes, Glasses / Contacts. Ear/Nose/Mouth/Throat Denies complaints or symptoms of Chronic sinus problems or rhinitis. Genitourinary Denies complaints or symptoms of Frequent urination. Integumentary (Skin) Complains or has symptoms of Wounds. Musculoskeletal Denies complaints or symptoms of Muscle Pain, Muscle  Weakness. Neurologic Denies complaints or symptoms of Numbness/parasthesias. Psychiatric Denies complaints or symptoms of Claustrophobia, Suicidal. Objective Constitutional respirations regular, non-labored and within target range for patient.. Vitals Time Taken: 8:11 AM, Height: 62 in, Source: Stated, Weight: 225 lbs, Source: Stated, BMI: 41.1, Temperature: 98 F, Pulse: 87 bpm, Respiratory Rate: 17 breaths/min, Blood Pressure: 141/92 mmHg. Cardiovascular 2+ dorsalis pedis/posterior tibialis pulses. Psychiatric pleasant and cooperative. General Notes: Lymphedema skin changes to the lower extremities bilaterally. 2+ pitting edema to the knee. No open wounds. No surrounding signs of infection. Assessment Active Problems ICD-10 Lymphedema, not elsewhere classified Venous insufficiency (chronic) (peripheral) Chronic kidney disease, stage 4 (severe) Patient has a history of lymphedema secondary to venous insufficiency and worsened by his stage IV kidney disease. There are no open wounds today.At this time I recommended he wear daily compression stockings. I also recommended a referral to a lymphedema clinic and he was agreeable to this. He may follow-up as needed. Plan Discharge From [facility] Services: Discharge from [place] Edema Control - Lymphedema /  SCD / Other: Elevate legs to the level of the heart or above for 30 minutes daily and/or when sitting, a frequency of: Avoid standing for long periods of time. Patient to wear own compression stockings every day. Moisturize legs daily. ordered were: Referral to lymphedema clinic 1. Daily compression stockings 2. Referral to lymphedema clinic 3. Follow-up as needed Electronic Signature(s) Signed: 06/23/2022 10:01:53 AM By: Kalman Shan DO Entered By: Kalman Shan on 06/22/2022 16:46:29 -------------------------------------------------------------------------------- HxROS Details Patient Name: Date of Service: RA  TLIFF, LEO Delane Ginger 06/22/2022 8:00 A M Medical Record Number: [id number] Patient Account Number: [id number] Date of Birth/Sex: Treating RN: September 22, 1966 (56 y.o. Erie Noe Primary Care Provider: PCP, NO Other Clinician: Referring Provider: Treating Provider/Extender: Yaakov Guthrie in Treatment: 0 Information Obtained From Patient Chart Constitutional Symptoms (General Health) Complaints and Symptoms: Negative for: Fatigue; Fever; Chills; Marked Weight Change Eyes Complaints and Symptoms: Negative for: Dry Eyes; Vision Changes; Glasses / Contacts Medical History: Negative for: Cataracts; Glaucoma; Optic Neuritis Past Medical History Notes: blind left eye Ear/Nose/Mouth/Throat Complaints and Symptoms: Negative for: Chronic sinus problems or rhinitis Medical History: Negative for: Chronic sinus problems/congestion; Middle ear problems Genitourinary Complaints and Symptoms: Negative for: Frequent urination Integumentary (Skin) Complaints and Symptoms: Positive for: Wounds Musculoskeletal Complaints and Symptoms: Negative for: Muscle Pain; Muscle Weakness Neurologic Complaints and Symptoms: Negative for: Numbness/parasthesias Psychiatric Complaints and Symptoms: Negative for: Claustrophobia; Suicidal Hematologic/Lymphatic Respiratory Medical History: Positive for: Asthma Cardiovascular Medical History: Positive for: Arrhythmia - A-fibb; Hypertension; Peripheral Venous Disease Endocrine Medical History: Positive for: Type II Diabetes Immunological Oncologic Immunizations Pneumococcal Vaccine: Received Pneumococcal Vaccination: No Implantable Devices None Hospitalization / Surgery History Type of Hospitalization/Surgery sinus endo with fusion hernia repair adrenalectomy Family and Social History Unknown History: Yes; Never smoker; Marital Status - Single; Alcohol Use: Never; Drug Use: No History; Caffeine Use: Rarely; Financial Concerns: No; Food,  Clothing or Shelter Needs: No; Support System Lacking: No; Transportation Concerns: No Electronic Signature(s) Signed: 06/23/2022 10:01:53 AM By: Kalman Shan DO Signed: 06/24/2022 4:30:40 PM By: Rhae Hammock RN Entered By: Rhae Hammock on 06/22/2022 08:17:53 -------------------------------------------------------------------------------- SuperBill Details Patient Name: Date of Service: RA Gerri Spore 06/22/2022 Medical Record Number: [id number] Patient Account Number: [id number] Date of Birth/Sex: Treating RN: 13-Jul-1966 (56 y.o. Erie Noe Primary Care Provider: PCP, NO Other Clinician: Referring Provider: Treating Provider/Extender: Yaakov Guthrie in Treatment: 0 Diagnosis Coding ICD-10 Codes Code Description I89.0 Lymphedema, not elsewhere classified I87.2 Venous insufficiency (chronic) (peripheral) N18.4 Chronic kidney disease, stage 4 (severe) Facility Procedures CPT4 Code: [id number] Description: 76226 - WOUND CARE VISIT-LEV 3 EST PT Modifier: Quantity: 1 Physician Procedures : CPT4 Code Description Modifier [id number] [place] PHYS LEVEL 3 NEW PT ICD-10 Diagnosis Description I89.0 Lymphedema, not elsewhere classified I87.2 Venous insufficiency (chronic) (peripheral) N18.4 Chronic kidney disease, stage 4 (severe) Quantity: 1 Electronic Signature(s) Signed: 06/23/2022 10:01:53 AM By: Kalman Shan DO Entered By: Kalman Shan on 06/22/2022 16:47:10

## 2022-09-16 ENCOUNTER — Ambulatory Visit: Payer: Medicare Other | Admitting: Internal Medicine

## 2022-10-11 ENCOUNTER — Emergency Department (HOSPITAL_BASED_OUTPATIENT_CLINIC_OR_DEPARTMENT_OTHER): Payer: Medicare Other

## 2022-10-11 ENCOUNTER — Observation Stay (HOSPITAL_BASED_OUTPATIENT_CLINIC_OR_DEPARTMENT_OTHER)
Admission: EM | Admit: 2022-10-11 | Discharge: 2022-10-12 | Disposition: A | Discharge status: Home / Self Care | Payer: Medicare Other | Attending: Family Medicine | Admitting: Family Medicine

## 2022-10-11 ENCOUNTER — Other Ambulatory Visit: Payer: Self-pay

## 2022-10-11 ENCOUNTER — Encounter (HOSPITAL_COMMUNITY): Payer: Self-pay

## 2022-10-11 ENCOUNTER — Encounter (HOSPITAL_BASED_OUTPATIENT_CLINIC_OR_DEPARTMENT_OTHER): Payer: Self-pay | Admitting: Emergency Medicine

## 2022-10-11 DIAGNOSIS — I700 Atherosclerosis of aorta: Secondary | ICD-10-CM | POA: Diagnosis not present

## 2022-10-11 DIAGNOSIS — Z7901 Long term (current) use of anticoagulants: Secondary | ICD-10-CM | POA: Diagnosis not present

## 2022-10-11 DIAGNOSIS — H6693 Otitis media, unspecified, bilateral: Secondary | ICD-10-CM | POA: Diagnosis not present

## 2022-10-11 DIAGNOSIS — Z7984 Long term (current) use of oral hypoglycemic drugs: Secondary | ICD-10-CM | POA: Insufficient documentation

## 2022-10-11 DIAGNOSIS — Z1152 Encounter for screening for COVID-19: Secondary | ICD-10-CM | POA: Diagnosis not present

## 2022-10-11 DIAGNOSIS — J189 Pneumonia, unspecified organism: Secondary | ICD-10-CM | POA: Diagnosis present

## 2022-10-11 DIAGNOSIS — I480 Paroxysmal atrial fibrillation: Secondary | ICD-10-CM | POA: Diagnosis not present

## 2022-10-11 DIAGNOSIS — J9601 Acute respiratory failure with hypoxia: Secondary | ICD-10-CM | POA: Diagnosis not present

## 2022-10-11 DIAGNOSIS — I3139 Other pericardial effusion (noninflammatory): Secondary | ICD-10-CM | POA: Diagnosis not present

## 2022-10-11 DIAGNOSIS — E1122 Type 2 diabetes mellitus with diabetic chronic kidney disease: Secondary | ICD-10-CM | POA: Diagnosis not present

## 2022-10-11 DIAGNOSIS — Z6839 Body mass index (BMI) 39.0-39.9, adult: Secondary | ICD-10-CM | POA: Insufficient documentation

## 2022-10-11 DIAGNOSIS — I2510 Atherosclerotic heart disease of native coronary artery without angina pectoris: Secondary | ICD-10-CM | POA: Diagnosis not present

## 2022-10-11 DIAGNOSIS — N186 End stage renal disease: Secondary | ICD-10-CM | POA: Diagnosis not present

## 2022-10-11 DIAGNOSIS — K219 Gastro-esophageal reflux disease without esophagitis: Secondary | ICD-10-CM | POA: Diagnosis not present

## 2022-10-11 DIAGNOSIS — Z992 Dependence on renal dialysis: Secondary | ICD-10-CM | POA: Diagnosis not present

## 2022-10-11 DIAGNOSIS — E785 Hyperlipidemia, unspecified: Secondary | ICD-10-CM | POA: Diagnosis not present

## 2022-10-11 DIAGNOSIS — I120 Hypertensive chronic kidney disease with stage 5 chronic kidney disease or end stage renal disease: Secondary | ICD-10-CM | POA: Insufficient documentation

## 2022-10-11 DIAGNOSIS — E118 Type 2 diabetes mellitus with unspecified complications: Secondary | ICD-10-CM | POA: Diagnosis present

## 2022-10-11 DIAGNOSIS — I10 Essential (primary) hypertension: Secondary | ICD-10-CM | POA: Diagnosis present

## 2022-10-11 DIAGNOSIS — Z79899 Other long term (current) drug therapy: Secondary | ICD-10-CM | POA: Diagnosis not present

## 2022-10-11 LAB — BRAIN NATRIURETIC PEPTIDE: B Natriuretic Peptide: 97.1 pg/mL (ref 0.0–100.0)

## 2022-10-11 LAB — I-STAT VENOUS BLOOD GAS, ED
Acid-base deficit: 5 mmol/L — ABNORMAL HIGH (ref 0.0–2.0)
Bicarbonate: 21 mmol/L (ref 20.0–28.0)
Calcium, Ion: 1.22 mmol/L (ref 1.15–1.40)
HCT: 27 % — ABNORMAL LOW (ref 39.0–52.0)
Hemoglobin: 9.2 g/dL — ABNORMAL LOW (ref 13.0–17.0)
O2 Saturation: 52 %
Patient temperature: 97.6
Potassium: 3.3 mmol/L — ABNORMAL LOW (ref 3.5–5.1)
Sodium: 140 mmol/L (ref 135–145)
TCO2: 22 mmol/L (ref 22–32)
pCO2, Ven: 38.8 mmHg — ABNORMAL LOW (ref 44–60)
pH, Ven: 7.339 (ref 7.25–7.43)
pO2, Ven: 29 mmHg — CL (ref 32–45)

## 2022-10-11 LAB — CBC WITH DIFFERENTIAL/PLATELET
Abs Immature Granulocytes: 0.98 10*3/uL — ABNORMAL HIGH (ref 0.00–0.07)
Basophils Absolute: 0.1 10*3/uL (ref 0.0–0.1)
Basophils Relative: 1 %
Eosinophils Absolute: 0.4 10*3/uL (ref 0.0–0.5)
Eosinophils Relative: 2 %
HCT: 30 % — ABNORMAL LOW (ref 39.0–52.0)
Hemoglobin: 9.6 g/dL — ABNORMAL LOW (ref 13.0–17.0)
Immature Granulocytes: 5 %
Lymphocytes Relative: 8 %
Lymphs Abs: 1.6 10*3/uL (ref 0.7–4.0)
MCH: 28.2 pg (ref 26.0–34.0)
MCHC: 32 g/dL (ref 30.0–36.0)
MCV: 88.2 fL (ref 80.0–100.0)
Monocytes Absolute: 2.3 10*3/uL — ABNORMAL HIGH (ref 0.1–1.0)
Monocytes Relative: 12 %
Neutro Abs: 13.3 10*3/uL — ABNORMAL HIGH (ref 1.7–7.7)
Neutrophils Relative %: 72 %
Platelets: 491 10*3/uL — ABNORMAL HIGH (ref 150–400)
RBC: 3.4 MIL/uL — ABNORMAL LOW (ref 4.22–5.81)
RDW: 19.6 % — ABNORMAL HIGH (ref 11.5–15.5)
WBC: 18.6 10*3/uL — ABNORMAL HIGH (ref 4.0–10.5)
nRBC: 0.6 % — ABNORMAL HIGH (ref 0.0–0.2)

## 2022-10-11 LAB — COMPREHENSIVE METABOLIC PANEL
ALT: 16 U/L (ref 0–44)
AST: 19 U/L (ref 15–41)
Albumin: 3.2 g/dL — ABNORMAL LOW (ref 3.5–5.0)
Alkaline Phosphatase: 78 U/L (ref 38–126)
Anion gap: 9 (ref 5–15)
BUN: 62 mg/dL — ABNORMAL HIGH (ref 6–20)
CO2: 22 mmol/L (ref 22–32)
Calcium: 8.8 mg/dL — ABNORMAL LOW (ref 8.9–10.3)
Chloride: 109 mmol/L (ref 98–111)
Creatinine, Ser: 6.03 mg/dL — ABNORMAL HIGH (ref 0.61–1.24)
GFR, Estimated: 10 mL/min — ABNORMAL LOW (ref >60)
Glucose, Bld: 92 mg/dL (ref 70–99)
Potassium: 3.2 mmol/L — ABNORMAL LOW (ref 3.5–5.1)
Sodium: 140 mmol/L (ref 135–145)
Total Bilirubin: 0.5 mg/dL (ref 0.3–1.2)
Total Protein: 7.3 g/dL (ref 6.5–8.1)

## 2022-10-11 LAB — URINALYSIS, ROUTINE W REFLEX MICROSCOPIC
Bilirubin Urine: NEGATIVE
Glucose, UA: 100 mg/dL — AB
Ketones, ur: NEGATIVE mg/dL
Leukocytes,Ua: NEGATIVE
Nitrite: NEGATIVE
Protein, ur: >=300 mg/dL — AB
Specific Gravity, Urine: 1.02 (ref 1.005–1.030)
pH: 7 (ref 5.0–8.0)

## 2022-10-11 LAB — URINALYSIS, MICROSCOPIC (REFLEX)

## 2022-10-11 LAB — TROPONIN I (HIGH SENSITIVITY)
Troponin I (High Sensitivity): 21 ng/L — ABNORMAL HIGH (ref <18)
Troponin I (High Sensitivity): 21 ng/L — ABNORMAL HIGH (ref <18)

## 2022-10-11 MED ORDER — FUROSEMIDE 10 MG/ML IJ SOLN
40.0000 mg | Freq: Once | INTRAMUSCULAR | Status: AC
  Administered 2022-10-11: 40 mg via INTRAVENOUS
  Filled 2022-10-11: qty 4

## 2022-10-11 MED ORDER — IPRATROPIUM-ALBUTEROL 0.5-2.5 (3) MG/3ML IN SOLN
3.0000 mL | Freq: Four times a day (QID) | RESPIRATORY_TRACT | Status: DC
  Administered 2022-10-11 – 2022-10-12 (×3): 3 mL via RESPIRATORY_TRACT
  Filled 2022-10-11 (×5): qty 3

## 2022-10-11 MED ORDER — IPRATROPIUM-ALBUTEROL 0.5-2.5 (3) MG/3ML IN SOLN
RESPIRATORY_TRACT | Status: AC
  Administered 2022-10-11: 3 mL
  Filled 2022-10-11: qty 3

## 2022-10-11 MED ORDER — SODIUM CHLORIDE 0.9 % IV SOLN
1.0000 g | INTRAVENOUS | Status: DC
  Administered 2022-10-11: 1 g via INTRAVENOUS
  Filled 2022-10-11: qty 10

## 2022-10-11 MED ORDER — ALBUTEROL (5 MG/ML) CONTINUOUS INHALATION SOLN
INHALATION_SOLUTION | RESPIRATORY_TRACT | Status: AC
  Administered 2022-10-11: 2.5 mg
  Filled 2022-10-11: qty 0.5

## 2022-10-11 MED ORDER — CHLORHEXIDINE GLUCONATE CLOTH 2 % EX PADS: 6.0000 | MEDICATED_PAD | Freq: Every day | CUTANEOUS | Status: DC

## 2022-10-11 MED ORDER — ALBUTEROL SULFATE (2.5 MG/3ML) 0.083% IN NEBU
2.5000 mg | INHALATION_SOLUTION | RESPIRATORY_TRACT | Status: DC | PRN
  Administered 2022-10-12: 2.5 mg via RESPIRATORY_TRACT
  Filled 2022-10-11: qty 3

## 2022-10-11 MED ORDER — POTASSIUM CHLORIDE 20 MEQ PO PACK
20.0000 meq | PACK | Freq: Once | ORAL | Status: AC
  Administered 2022-10-11: 20 meq via ORAL
  Filled 2022-10-11: qty 1

## 2022-10-11 MED ORDER — SODIUM CHLORIDE 0.9 % IV SOLN
2.0000 g | INTRAVENOUS | Status: DC
  Filled 2022-10-11: qty 20

## 2022-10-11 MED ORDER — SODIUM CHLORIDE 0.9 % IV SOLN
1.0000 g | Freq: Once | INTRAVENOUS | Status: AC
  Administered 2022-10-11: 1 g via INTRAVENOUS
  Filled 2022-10-11: qty 10

## 2022-10-11 MED ORDER — SODIUM CHLORIDE 0.9 % IV SOLN
500.0000 mg | INTRAVENOUS | Status: DC
  Administered 2022-10-11: 500 mg via INTRAVENOUS
  Filled 2022-10-11 (×2): qty 5

## 2022-10-11 NOTE — Plan of Care (Signed)
Transfer from [facility] 56 year old male with pmh HTN, SVT, ESRD on HD (TTS), morbid obesity, and OSA who presented with complaints of worsening shortness of breath and cough.  Had been started on steroids yesterday and missed dialysis due to feeling unwell.  Found to have new oxygen requirement on 2 L with O2 saturations maintained.  CT scan of the chest noted bilateral pneumonia.  Started on azithromycin and Rocephin.  Orders placed for inpatient to a medical telemetry bed.

## 2022-10-11 NOTE — ED Notes (Signed)
One set blood cultures drawn and held with IV start

## 2022-10-11 NOTE — ED Notes (Signed)
Pt reports his home albuterol is expired

## 2022-10-11 NOTE — H&P (Signed)
History and Physical  Gary Buchanan OXB:[id number] DOB: Nov 29, 1966 DOA: 10/11/2022  Referring physician: Accepted by Dr. Tamala Julian, [facility]  PCP: Clarisse Gouge, PA-C  Outpatient Specialists: Nephrology Patient coming from: Home  Chief Complaint: Shortness of breath and cough   HPI: Gary Buchanan is a 57 y.o. male with medical history significant for ESRD on HD TTS, paroxysmal A-fib on Eliquis, asthma, type 2 diabetes, hyperlipidemia, obesity, who presented to [facility] hospital telemetry medical unit as a direct transfer from [facility] ED due to progressive shortness of breath associated with a productive cough.  Onset of symptoms 5 days ago.  No reported subjective fevers.  Endorses left-sided chest pain worse with coughing.  Presented at outside facility on 10/07/2022 for the same and diagnosed with asthma exacerbation.  Was prescribed steroid taper and was given ambulatory referral to pulmonary.  Was also diagnosed with otitis media during that visit and prescribed otic solution.  Due to persistent symptoms, he presented to the ED for further evaluation.  Upon presentation to [facility] ED, CT chest without contrast showed bilateral pulmonary infiltrates suggestive of multifocal pneumonia.  The patient was started on empiric IV antibiotics Rocephin and azithromycin.  Admitted by [facility], hospitalist service.  ED Course: Tmax 98.2.  BP 145/95, pulse 100, respiratory rate 20, O2 saturation 94% on 2 L.  Labs today significant for serum potassium 3.3.  BUN 62, creatinine 6.03.  Troponin 21, repeat 21.  Review of Systems: Review of systems as noted in the HPI. All other systems reviewed and are negative.   Past Medical History:  Diagnosis Date   A-fib ([place])    Asthma    Blindness of left eye    Chronic kidney disease    Diabetes mellitus without complication (HCC)    Dyspnea    ESRD (end stage renal disease) (HCC)    Gynecomastia    Hypertension    Left knee pain    SVT (supraventricular tachycardia)     Past Surgical History:  Procedure Laterality Date   ADRENALECTOMY     EYE SURGERY     HERNIA REPAIR     SINOSCOPY     SINUS ENDO WITH FUSION  10/17/2012   Procedure: SINUS ENDO WITH FUSION;  Surgeon: Ascencion Dike, MD;  Location: [place];  Service: ENT;  Laterality: Bilateral;  Bilateral Ethmoidectomy,  Bilateral Maxillary Antrostomy, Bilateral Frontal Recess Exploration,   W/ Fusion Protocol    Social History:  reports that he has never smoked. He has never used smokeless tobacco. He reports that he does not drink alcohol and does not use drugs.   Allergies  Allergen Reactions   Ace Inhibitors Swelling and Other (See Comments)    Lips swells Other reaction(s): Other (see comments) Other Reaction: LIP SWELLING Lips swelling Other reaction(s): Other Lip swelling Lips swells Other reaction(s): Other (see comments)   Ipratropium-Albuterol Palpitations   Latex Itching   Lisinopril Swelling    Other reaction(s): Other Lip swelling   Gabapentin Other (See Comments)    Other reaction(s): Other (See Comments) Rapid heartbeat  Other reaction(s): Other Rapid heartbeat  Other reaction(s): Other (See Comments) Rapid heartbeat  Rapid heartbeat  Rapid heartbeat; also head hurts Other reaction(s): Other (See Comments) Rapid heartbeat  Other reaction(s): Other Rapid heartbeat  Other reaction(s): Other (See Comments) Rapid heartbeat  Rapid heartbeat    Tizanidine Other (See Comments)    Other reaction(s): Other chest pain  Other reaction(s): Other (See Comments) Chest pain Other reaction(s): Other (See Comments)  Chest pain chest pain  chest pain  Other reaction(s): Other chest pain  Other reaction(s): Other (See Comments) Chest pain Other reaction(s): Other (See Comments) Chest pain chest pain     Family History  Problem Relation Age of Onset   Diabetes Mother    Hypertension Mother    Hyperlipidemia Mother    Diabetes Father    Hypertension  Father    Diabetes Sister    Hypertension Sister    Diabetes Brother    Hypertension Brother    Allergic rhinitis Neg Hx    Asthma Neg Hx    Eczema Neg Hx    Urticaria Neg Hx       Prior to Admission medications   Medication Sig Start Date End Date Taking? Authorizing Provider  albuterol (PROVENTIL) (2.5 MG/3ML) 0.083% nebulizer solution SMARTSIG:3 Milliliter(s) Via Nebulizer Every 4 Hours PRN Patient not taking: Reported on 07/23/2022 09/18/21   [provider]  allopurinol (ZYLOPRIM) 100 MG tablet Take 200 mg by mouth daily. Patient not taking: Reported on 07/23/2022 09/13/19   [provider]  allopurinol (ZYLOPRIM) 300 MG tablet Take 1 tablet (300 mg total) by mouth daily. Patient not taking: Reported on 07/23/2022 12/09/18   Ronnald Nian, DO  amiodarone (PACERONE) 200 MG tablet Take by mouth. Patient not taking: Reported on 07/23/2022 11/15/19   [provider]  amLODipine (NORVASC) 10 MG tablet Take 1 tablet (10 mg total) by mouth daily. 04/26/19   Cirigliano, Garvin Fila, DO  apixaban (ELIQUIS) 5 MG TABS tablet Take by mouth. Patient not taking: Reported on 07/23/2022 09/13/19   [provider]  aspirin EC 81 MG tablet Take 81 mg by mouth daily.    [provider]  atorvastatin (LIPITOR) 10 MG tablet Take by mouth. Patient not taking: Reported on 07/23/2022 03/31/21   [provider]  atorvastatin (LIPITOR) 40 MG tablet Take 40 mg by mouth daily. 06/18/22   [provider]  Blood Glucose Monitoring Suppl (ACCU-CHEK AVIVA PLUS) w/Device KIT USE DAILY AS DIRECTED 12/17/17   Mayo, Pete Pelt, MD  BREO ELLIPTA 100-25 MCG/INH AEPB 1 puff daily. Patient not taking: Reported on 07/23/2022 11/15/19   [provider]  Brinzolamide-Brimonidine 1-0.2 % SUSP Place 1 drop into the right eye 3 (three) times daily. Patient not taking: Reported on 07/23/2022    [provider]  budesonide (PULMICORT) 0.5 MG/2ML nebulizer  solution Take 2 mLs (0.5 mg total) by nebulization 2 (two) times daily. Patient not taking: Reported on 07/23/2022 05/20/17   [provider]  calcitRIOL (ROCALTROL) 0.5 MCG capsule Take 0.5 mcg by mouth daily. 07/06/22   [provider]  carvedilol (COREG) 12.5 MG tablet Take 12.5 mg by mouth 2 (two) times daily. 07/06/22   [provider]  clobetasol ointment (TEMOVATE) 0.62 % Apply 1 Application topically 2 (two) times daily. 07/23/22   Valentina Shaggy, MD  cloNIDine (CATAPRES) 0.3 MG tablet Take by mouth. Patient not taking: Reported on 07/23/2022 04/30/17   [provider]  colchicine 0.6 MG tablet Take 0.6 mg by mouth daily. Patient not taking: Reported on 07/23/2022 12/05/19   [provider]  diclofenac sodium (VOLTAREN) 1 % GEL Apply 2 g topically 4 (four) times daily. Patient not taking: Reported on 07/23/2022 05/19/19   Ronnald Nian, DO  diltiazem (CARDIZEM CD) 120 MG 24 hr capsule Take 120 mg by mouth daily. 07/06/22   [provider]  doxycycline (VIBRAMYCIN) 100 MG capsule Take 100 mg by  mouth 2 (two) times daily. Patient not taking: Reported on 07/23/2022 06/23/21   [provider]  ELIQUIS 5 MG TABS tablet Take 5 mg by mouth 2 (two) times daily. Patient not taking: Reported on 07/23/2022 11/21/19   [provider]  febuxostat (ULORIC) 40 MG tablet Take 40 mg by mouth daily. 12/05/19   [provider]  ferrous sulfate 325 (65 FE) MG tablet Take 325 mg by mouth daily with breakfast. Patient not taking: Reported on 07/23/2022    [provider]  fexofenadine (ALLEGRA) 180 MG tablet Take by mouth. Patient not taking: Reported on 07/23/2022    [provider]  FLOVENT HFA 220 MCG/ACT inhaler TAKE 2 PUFFS BY MOUTH TWICE A DAY 02/29/20   Bobbitt, Sedalia Muta, MD  fluticasone (FLONASE) 50 MCG/ACT nasal spray Place 2 sprays into both nostrils daily. Patient not taking: Reported on 07/23/2022  04/26/19   Cirigliano, Stanton Kidney K, DO  fluticasone furoate-vilanterol (BREO ELLIPTA) 100-25 MCG/INH AEPB Inhale into the lungs. Patient not taking: Reported on 07/23/2022 11/15/19   [provider]  folic acid (FOLVITE) 952 MCG tablet Take 400 mcg by mouth daily.    [provider]  furosemide (LASIX) 20 MG tablet TAKE 1 TABLET BY MOUTH EVERY MORNING FOR BLOOD PRESSURE AND FLUID RETENTION. Patient not taking: Reported on 07/23/2022 12/09/18   Letta Median K, DO  glucose blood [facility] BLOOD GLUCOSE TEST) test strip Use 1 strip to check blood sugar dailyProdigy voice test strips DX: E11.8 and H54.40 05/20/21   [provider]  ipratropium-albuterol (DUONEB) 0.5-2.5 (3) MG/3ML SOLN Take 3 mLs by nebulization every 6 (six) hours as needed. 04/26/19   Cirigliano, Garvin Fila, DO  ketoconazole (NIZORAL) 2 % cream Apply topically. 09/18/21   [provider]  Latanoprostene Bunod (VYZULTA) 0.024 % SOLN Place 1 drop into the right eye nightly.    [provider]  loratadine (CLARITIN) 10 MG tablet TAKE 1 TABLET BY MOUTH EVERY DAY Patient not taking: Reported on 07/23/2022 05/10/19   Ronnald Nian, DO  losartan (COZAAR) 50 MG tablet Take 1 tablet (50 mg total) by mouth daily. Patient not taking: Reported on 07/23/2022 05/05/19   Ronnald Nian, DO  magnesium gluconate (MAGONATE) 500 MG tablet Take 1 tablet (500 mg total) by mouth daily. Patient not taking: Reported on 07/23/2022 12/09/18   Letta Median K, DO  magnesium oxide (MAG-OX) 400 (240 Mg) MG tablet Take 1 tablet by mouth 2 (two) times daily. 07/06/22   [provider]  magnesium oxide (MAG-OX) 400 MG tablet TK 1 T PO HS Patient not taking: Reported on 07/23/2022 03/11/16   [provider]  metoprolol succinate (TOPROL-XL) 25 MG 24 hr tablet Take 1 tablet by mouth daily. Patient not taking: Reported on 07/23/2022 10/13/21 10/13/22  [provider]  metoprolol tartrate (LOPRESSOR) 50 MG  tablet Take 50 mg by mouth 2 (two) times daily. Patient not taking: Reported on 07/23/2022 11/15/19   [provider]  montelukast (SINGULAIR) 10 MG tablet Take 10 mg by mouth daily. 07/03/19   [provider]  Multiple Vitamins-Minerals (SENTRY SENIOR PO) Take 1 tablet by mouth daily.    [provider]  pantoprazole (PROTONIX) 40 MG tablet Take 40 mg by mouth daily. 09/18/21   [provider]  predniSONE (DELTASONE) 20 MG tablet 3 tabs po daily x 3 days, then 2 tabs x 3 days, then 1.5 tabs x 3 days, then 1 tab x 3 days, then 0.5 tabs  x 3 days 08/25/19   Sherwood Gambler, MD  pyridOXINE (VITAMIN B-6) 100 MG tablet Take 100 mg by mouth daily.    [provider]  silver sulfADIAZINE (SILVADENE) 1 % cream SMARTSIG:Sparingly Topical Daily 07/02/21   [provider]  sitaGLIPtin (JANUVIA) 50 MG tablet Take 1 tablet (50 mg total) by mouth daily. 04/26/19   Cirigliano, Garvin Fila, DO  timolol (BETIMOL) 0.25 % ophthalmic solution Place 1-2 drops into the right eye 2 (two) times daily.    [provider]  timolol (TIMOPTIC) 0.25 % ophthalmic solution Place 1 drop into the left eye 2 (two) times daily. 01/17/20   [provider]  Tiotropium Bromide Monohydrate (SPIRIVA RESPIMAT) 1.25 MCG/ACT AERS Inhale 2 puffs into the lungs daily. Patient not taking: Reported on 07/23/2022 08/24/19   Adelina Mings, MD  torsemide [facility]) 20 MG tablet Take by mouth. 08/25/21   [provider]  Travoprost, BAK Free, (TRAVATAN) 0.004 % SOLN ophthalmic solution Administer 1 drop to both eyes nightly.    [provider]  valACYclovir (VALTREX) 1000 MG tablet Take 1 tablet (1,000 mg total) by mouth daily. 04/26/19   Cirigliano, Garvin Fila, DO  Vitamin D, Ergocalciferol, (DRISDOL) 1.25 MG (50000 UNIT) CAPS capsule Take 50,000 Units by mouth once a week. 07/02/21   [provider]  vitamin E (VITAMIN E) 200 UNIT capsule Take 200 Units by mouth  daily. Patient not taking: Reported on 07/23/2022    [provider]  VYZULTA 0.024 % SOLN Apply 1 drop to eye at bedtime. 12/31/19   [provider]    Physical Exam: BP (!) 145/95 (BP Location: Right Arm)   Pulse 100   Temp 98.1 F (36.7 C) (Oral)   Resp 20   Ht 5' 3" (1.6 m)   Wt 102.1 kg   SpO2 93%   BMI 39.86 kg/m   General: 56 y.o. year-old male well developed well nourished in no acute distress.  Alert and oriented x3. Cardiovascular: Regular rate and rhythm with no rubs or gallops.  No thyromegaly or JVD noted.  2+ pitting edema in lower extremities bilaterally. Respiratory: Mild rales at bases.  No wheezing noted.  Poor inspiratory effort. Abdomen: Soft nontender nondistended with normal bowel sounds x4 quadrants. Muskuloskeletal: No cyanosis or clubbing.  2+ pitting edema lower extremities bilaterally. Neuro: CN II-XII intact, strength, sensation, reflexes Skin: No ulcerative lesions noted or rashes Psychiatry: Judgement and insight appear normal. Mood is appropriate for condition and setting          Labs on Admission:  Basic Metabolic Panel: Recent Labs  Lab 10/11/22 1046 10/11/22 1106  NA 140 140  K 3.2* 3.3*  CL 109  --   CO2 22  --   GLUCOSE 92  --   BUN 62*  --   CREATININE 6.03*  --   CALCIUM 8.8*  --    Liver Function Tests: Recent Labs  Lab 10/11/22 1046  AST 19  ALT 16  ALKPHOS 78  BILITOT 0.5  PROT 7.3  ALBUMIN 3.2*   No results for input(s): "LIPASE", "AMYLASE" in the last 168 hours. No results for input(s): "AMMONIA" in the last 168 hours. CBC: Recent Labs  Lab 10/11/22 1046 10/11/22 1106  WBC 18.6*  --   NEUTROABS 13.3*  --   HGB 9.6* 9.2*  HCT 30.0* 27.0*  MCV 88.2  --   PLT 491*  --    Cardiac Enzymes: No results for input(s): "CKTOTAL", "CKMB", "CKMBINDEX", "  TROPONINI" in the last 168 hours.  BNP (last 3 results) Recent Labs    10/11/22 1046  BNP 97.1    ProBNP (last 3 results) No results for  input(s): "PROBNP" in the last 8760 hours.  CBG: No results for input(s): "GLUCAP" in the last 168 hours.  Radiological Exams on Admission: CT Chest Wo Contrast  Result Date: 10/11/2022 CLINICAL DATA:  Pneumonia, complication suspected, xray done EXAM: CT CHEST WITHOUT CONTRAST TECHNIQUE: Multidetector CT imaging of the chest was performed following the standard protocol without IV contrast. RADIATION DOSE REDUCTION: This exam was performed according to the departmental dose-optimization program which includes automated exposure control, adjustment of the mA and/or kV according to patient size and/or use of iterative reconstruction technique. COMPARISON:  X-ray 10/11/2022.  CT 07/02/2022 FINDINGS: Cardiovascular: Stable mild cardiomegaly. Small-moderate pericardial effusion is unchanged. Thoracic aorta is nonaneurysmal. Scattered atherosclerotic vascular calcifications of the aorta and coronary arteries. Central pulmonary vasculature is nondilated. Right IJ approach central venous line terminates at the superior cavoatrial junction. Mediastinum/Nodes: No enlarged mediastinal or axillary lymph nodes. Thyroid gland, trachea, and esophagus demonstrate no significant findings. Lungs/Pleura: Patchy bibasilar atelectasis or consolidation. Multifocal scattered areas of tree-in-bud nodularity within both lungs, notably within the anterior aspects of the right upper lobe and left upper lobe. No pleural effusion or pneumothorax. Upper Abdomen: No acute abnormality. Musculoskeletal: Asymmetric right-sided gynecomastia, similar in appearance to prior and has been evaluated with dedicated diagnostic mammography and ultrasound. No acute bony findings. IMPRESSION: 1. Patchy bibasilar atelectasis or consolidation with multifocal scattered areas of tree-in-bud nodularity within both lungs, compatible with a multifocal infectious or inflammatory process. 2. Mild cardiomegaly with stable small-moderate pericardial effusion.  3. Aortic and coronary artery atherosclerosis (ICD10-I70.0). Electronically Signed   By: Davina Poke D.O.   On: 10/11/2022 11:41   DG Chest Port 1 View  Result Date: 10/11/2022 CLINICAL DATA:  56 year old male with cough, palpitations, chest pain, shortness of breath. EXAM: PORTABLE CHEST 1 VIEW COMPARISON:  Portable chest 07/31/2022 and earlier. FINDINGS: Portable AP upright view at 1043 hours. Mildly lower lung volumes. Stable cardiomegaly and mediastinal contours. Stable right chest dual lumen dialysis type catheter. Increased pulmonary vascular congestion. Mild blunting of both costophrenic angles. No pneumothorax or air bronchograms. Paucity of bowel gas in the upper abdomen. No acute osseous abnormality identified. IMPRESSION: Cardiomegaly with lower lung volumes and increased without vascular overt edema. But probable small bilateral pleural effusions. Electronically Signed   By: Genevie Ann M.D.   On: 10/11/2022 10:58    EKG: I independently viewed the EKG done and my findings are as followed: Sinus rhythm rate of 87.  Nonspecific ST-T changes.  QTc 429.  Assessment/Plan Present on Admission:  Acute respiratory failure with hypoxia (HCC)  Principal Problem:   Acute respiratory failure with hypoxia (HCC)  Multifocal pneumonia, POA Started on empiric antibiotic Rocephin and azithromycin, continue Obtain baseline procalcitonin Monitor fever curve and WBC.  Asthma, high risk for exacerbation in the setting of multifocal pneumonia Resume home bronchodilators DuoNeb every 6 hours  ESRD on HD TTS Missed hemodialysis on Saturday, 10/11/2022.   Consult nephrology in the morning to resume hemodialysis. Volume status and electrolytes managed with hemodialysis.  Hypertension Blood pressures are currently soft Resume home Coreg Hold off other home oral antihypertensives to avoid hypotension. Monitor vital signs  GERD Resume home PPI  Recently diagnosed otitis media Resume home  otic solution after home meds are reconciled   DVT prophylaxis: Subcu heparin 3 times daily  Code  Status: Full code  Family Communication: None at bedside  Disposition Plan: Admitted to telemetry medical unit  Consults called: None.  Admission status: Inpatient status.   Status is: Inpatient The patient requires at least 2 midnights for further evaluation and treatment of present condition.   Kayleen Memos MD [facility] Pager [phone]  If 7PM-7AM, please contact night-coverage [email or web address] Password [email or web address]  10/11/2022, 11:48 PM

## 2022-10-11 NOTE — ED Triage Notes (Addendum)
Pt reports 20 minute episode of palpitations yesterday which resolved. Last night began to have chest pain and shortness of breath. Pt reports there is swelling and pain on the L side of his chest. Wheezing noted by respiratory. Pt is a dialysis pt. Did not have scheduled dialysis yesterday.

## 2022-10-11 NOTE — Progress Notes (Addendum)
   10/11/22 2256  Vitals  Temp 98.1 F (36.7 C)  Temp Source Oral  BP (!) 145/95  BP Location Right Arm  BP Method Automatic  Patient Position (if appropriate) Sitting  Pulse Rate 100  Pulse Rate Source Monitor  ECG Heart Rate 100  Resp 20  MEWS COLOR  MEWS Score Color Green  Oxygen Therapy  SpO2 94 %  O2 Device Nasal Cannula  O2 Flow Rate (L/min) 2 L/min  Pain Assessment  Pain Scale 0-10  Pain Score 7  Pain Type Acute pain  Pain Location Chest  Pain Orientation Left;Upper  Pain Frequency Intermittent  Pain Onset With Activity (w/ coughing and movement)  Pain Intervention(s) RN made aware  MEWS Score  MEWS Temp 0  MEWS Systolic 0  MEWS Pulse 0  MEWS RR 0  MEWS LOC 0  MEWS Score 0   Pt arrived to 4E03 from [facility]. VSS. Pt very SOB w/ minimal exertion. Sats improved when some clear thick phlegm was coughed up. C/o left sided chest/shoulder pain worsened w/ cough and movement. Otherwise no other complaints.  Skin WDL, R chest HD cath present, dressing from outside facility present. Ordered supplies for dressing change and will change at earliest convenience.  Tele applied and verified x2. CHG bath done. Oriented to room and call light.

## 2022-10-11 NOTE — ED Provider Notes (Signed)
[facility] Provider Note  CSN: [id number] Arrival date & time: 10/11/22 1018  Chief Complaint(s) Chest Pain  HPI Gary Buchanan is a 56 y.o. male history of end-stage renal disease on dialysis Tuesday Thursday Saturday, diabetes, hypertension, A-fib on Eliquis presenting to the emergency department with shortness of breath.  Patient reports that he has felt short of breath since last Wednesday.  Initially went to his primary doctor and was started on steroids.  He reports he thinks his symptoms started after he got a flu shot on Monday.  His symptoms progressed until Saturday, he reports that he felt bad enough he did not go to dialysis because he was having trouble breathing.  He denies any fevers or chills.  Reports some productive cough.  Reports compliance with home medications inhalers and steroids.  Does report some left-sided chest pain.   Past Medical History Past Medical History:  Diagnosis Date   A-fib ([place])    Asthma    Blindness of left eye    Chronic kidney disease    Diabetes mellitus without complication (HCC)    Dyspnea    ESRD (end stage renal disease) ([place])    Gynecomastia    Hypertension    Left knee pain    SVT (supraventricular tachycardia)    Patient Active Problem List   Diagnosis Date Noted   Acute respiratory failure with hypoxia ([place]) 10/11/2022   Low vision 11/15/2019   Bradycardia 08/24/2019   Other allergic rhinitis 08/24/2019   Non-compliance 04/26/2019   Acute on chronic respiratory failure with hypoxia and hypercapnia (HCC) 05/31/2018   Asthma exacerbation 11/09/2017   Chest pain 10/11/2017   CHF (congestive heart failure) ([place]) 10/11/2017   Bilateral chronic serous otitis media 09/23/2017   SOB (shortness of breath) 07/15/2017   Asthma 06/21/2017   Blindness of left eye 06/21/2017   New onset a-fib ([place]) 06/21/2017   Type 2 diabetes mellitus with complication, without long-term current use of insulin ([place])  06/21/2017   Hypertension 06/21/2017   Obstructive sleep apnea syndrome 06/21/2017   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 07/11/2016   Hypertensive retinopathy 01/28/2016   Nuclear sclerotic cataract 01/28/2016   Presbyopia 01/28/2016   Ptosis of eyelid 01/28/2016   Mood disorder ([place]) 01/07/2015   Abnormal weight gain 12/11/2014   Hypercalcemia 12/11/2014   Muscle cramping 11/19/2014   Hyperprolactinemia ([place]) 10/10/2014   Male hypogonadism 10/10/2014   Chronic gout due to renal impairment, left ankle and foot, without tophus (tophi) 10/02/2014   Chronic gout of left foot due to renal impairment 10/02/2014   Chronic gout of right foot due to renal impairment 10/02/2014   Other gouty nephropathy 10/02/2014   Adrenal adenoma 07/23/2014   Gynecomastia 01/09/2014   Arthritis, multiple joint involvement 11/10/2013   Disorder of joint 11/10/2013   Anemia due to chronic kidney disease 01/31/2013   Chronic kidney disease, stage III (moderate) ([place]) 01/31/2013   Disorder of optic nerve 01/27/2013   Glaucoma suspect 01/27/2013   Nuclear senile cataract 01/27/2013   Optic atrophy 01/27/2013   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   Senile nuclear sclerosis 01/27/2013   Herpes dermatitis 11/30/2012   Herpes simplex complications 51/01/5851   Herpes simplex with complication 77/82/4235   Leg cramps 11/30/2012   Blepharitis 09/15/2012   Impairment level: one eye: total impairment: other eye: not specified  09/15/2012   Myogenic ptosis of eyelid 09/15/2012   Ptosis, myogenic 09/15/2012   Total impairment of one eye 09/15/2012   Visual loss, one eye, no light perception ([place]) 09/15/2012   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 Authorizing Provider  albuterol (PROVENTIL) (2.5 MG/3ML) 0.083% nebulizer solution SMARTSIG:3 Milliliter(s) Via Nebulizer Every 4 Hours PRN Patient not taking: Reported on 07/23/2022 09/18/21   [provider]  allopurinol (ZYLOPRIM) 100 MG tablet Take 200 mg by mouth daily. Patient not taking: Reported on 07/23/2022 09/13/19   [provider]  allopurinol (ZYLOPRIM) 300 MG tablet Take 1 tablet (300 mg total) by mouth daily. Patient not taking: Reported on 07/23/2022 12/09/18   Ronnald Nian, DO  amiodarone (PACERONE) 200 MG tablet Take by mouth. Patient not taking: Reported on 07/23/2022 11/15/19   [provider]  amLODipine (NORVASC) 10 MG tablet Take 1 tablet (10 mg total) by mouth daily. 04/26/19   Cirigliano, Garvin Fila, DO  apixaban (ELIQUIS) 5 MG TABS tablet Take by mouth. Patient not taking: Reported on 07/23/2022 09/13/19   [provider]  aspirin EC 81 MG tablet Take 81 mg by mouth daily.    [provider]  atorvastatin (LIPITOR) 10 MG tablet Take by mouth. Patient not taking: Reported on 07/23/2022 03/31/21   [provider]  atorvastatin (LIPITOR) 40 MG tablet Take 40 mg by mouth daily. 06/18/22   [provider]  Blood Glucose Monitoring Suppl (ACCU-CHEK AVIVA PLUS) w/Device KIT USE DAILY AS DIRECTED 12/17/17   Mayo, Pete Pelt, MD  BREO ELLIPTA 100-25 MCG/INH AEPB 1 puff daily. Patient not taking: Reported on 07/23/2022 11/15/19   [provider]  Brinzolamide-Brimonidine 1-0.2 % SUSP Place 1 drop into the right eye 3 (three) times daily. Patient not taking: Reported on 07/23/2022    [provider]  budesonide (PULMICORT) 0.5 MG/2ML nebulizer solution Take 2 mLs (0.5 mg total) by nebulization 2 (two) times daily. Patient not taking: Reported  on 07/23/2022 05/20/17   [provider]  calcitRIOL (ROCALTROL) 0.5 MCG capsule Take 0.5 mcg by mouth daily. 07/06/22   [provider]  carvedilol (COREG) 12.5 MG tablet Take 12.5 mg by mouth 2 (two) times daily. 07/06/22   [provider]  clobetasol ointment (TEMOVATE) 2.83 % Apply 1 Application topically 2 (two) times daily. 07/23/22   Valentina Shaggy, MD  cloNIDine (CATAPRES) 0.3 MG tablet Take by mouth. Patient not taking: Reported on 07/23/2022 04/30/17   [provider]  colchicine 0.6 MG tablet Take 0.6 mg by mouth daily. Patient not taking: Reported on 07/23/2022 12/05/19   [provider]  diclofenac sodium (VOLTAREN) 1 % GEL Apply 2 g topically 4 (four) times daily. Patient not taking: Reported on 07/23/2022 05/19/19   Ronnald Nian, DO  diltiazem (CARDIZEM CD) 120 MG 24 hr capsule Take 120 mg by mouth daily. 07/06/22   [provider]  doxycycline (VIBRAMYCIN) 100 MG capsule Take 100 mg by mouth 2 (two) times daily. Patient not taking: Reported on 07/23/2022 06/23/21   [provider]  ELIQUIS 5 MG TABS tablet Take 5 mg by mouth 2 (two) times daily. Patient not taking: Reported on 07/23/2022 11/21/19   [provider]  febuxostat (ULORIC) 40 MG tablet Take 40 mg by mouth daily. 12/05/19   [provider]  ferrous sulfate 325 (65 FE) MG tablet Take 325 mg by mouth daily with breakfast. Patient not taking: Reported on 07/23/2022    [provider]  fexofenadine (ALLEGRA) 180 MG tablet Take by mouth. Patient not taking: Reported on 07/23/2022    [provider]  FLOVENT HFA 220 MCG/ACT inhaler TAKE 2 PUFFS BY MOUTH TWICE A DAY 02/29/20   Bobbitt, [place] Muta, MD  fluticasone (FLONASE) 50 MCG/ACT nasal spray Place 2 sprays into both nostrils daily. Patient not taking: Reported on 07/23/2022 04/26/19   Cirigliano, Stanton Kidney K, DO  fluticasone furoate-vilanterol (BREO ELLIPTA) 100-25 MCG/INH AEPB Inhale  into the lungs. Patient not taking: Reported on 07/23/2022 11/15/19   [provider]  folic acid (FOLVITE) 712 MCG tablet Take 400 mcg by mouth daily.    [provider]  furosemide (LASIX) 20 MG tablet TAKE 1 TABLET BY MOUTH EVERY MORNING FOR BLOOD PRESSURE AND FLUID RETENTION. Patient not taking: Reported on 07/23/2022 12/09/18   Letta Median K, DO  glucose blood [facility] BLOOD GLUCOSE TEST) test strip Use 1 strip to check blood sugar dailyProdigy voice test strips DX: E11.8 and H54.40 05/20/21   [provider]  ipratropium-albuterol (DUONEB) 0.5-2.5 (3) MG/3ML SOLN Take 3 mLs by nebulization every 6 (six) hours as needed. 04/26/19   Cirigliano, Garvin Fila, DO  ketoconazole (NIZORAL) 2 % cream Apply topically. 09/18/21   [provider]  Latanoprostene Bunod (VYZULTA) 0.024 % SOLN Place 1 drop into the right eye nightly.    [provider]  loratadine (CLARITIN) 10 MG tablet TAKE 1 TABLET BY MOUTH EVERY DAY Patient not taking: Reported on 07/23/2022 05/10/19   Ronnald Nian, DO  losartan (COZAAR) 50 MG tablet Take 1 tablet (50 mg total) by mouth daily. Patient not taking: Reported on 07/23/2022 05/05/19   Ronnald Nian, DO  magnesium gluconate (MAGONATE) 500 MG tablet Take 1 tablet (500 mg total) by mouth daily. Patient not taking: Reported on 07/23/2022 12/09/18   Letta Median K, DO  magnesium oxide (MAG-OX) 400 (240 Mg) MG tablet Take 1 tablet by mouth 2 (two) times daily. 07/06/22   [provider]  magnesium oxide (MAG-OX) 400 MG tablet TK 1 T PO HS Patient not taking: Reported on 07/23/2022 03/11/16   [provider]  metoprolol succinate (TOPROL-XL) 25 MG 24 hr tablet Take 1 tablet by mouth daily. Patient not taking: Reported on 07/23/2022 10/13/21 10/13/22  [provider]  metoprolol tartrate (LOPRESSOR) 50 MG tablet Take 50 mg by mouth 2 (two) times daily. Patient not taking: Reported on 07/23/2022 11/15/19    [provider]  montelukast (SINGULAIR) 10 MG tablet Take 10 mg by mouth daily. 07/03/19   [provider]  Multiple Vitamins-Minerals (SENTRY SENIOR PO) Take 1 tablet by mouth daily.    [provider]  pantoprazole (PROTONIX) 40 MG tablet Take 40 mg by mouth daily. 09/18/21   [provider]  predniSONE (DELTASONE) 20 MG tablet 3 tabs po daily x 3 days, then 2 tabs x 3 days, then 1.5 tabs x 3 days, then 1 tab x 3 days, then 0.5 tabs x 3 days 08/25/19   Sherwood Gambler, MD  pyridOXINE (VITAMIN B-6) 100 MG tablet Take 100 mg by mouth daily.    [provider]  silver sulfADIAZINE (SILVADENE) 1 % cream SMARTSIG:Sparingly Topical Daily 07/02/21   [provider]  sitaGLIPtin (JANUVIA) 50 MG tablet Take 1 tablet (50 mg total) by mouth daily. 04/26/19   Cirigliano, Garvin Fila, DO  timolol (BETIMOL) 0.25 % ophthalmic solution Place 1-2 drops into the right eye 2 (two) times daily.    [provider]  timolol (TIMOPTIC) 0.25 % ophthalmic solution Place 1 drop into the left eye 2 (two) times daily. 01/17/20   [provider]  Tiotropium Bromide Monohydrate (SPIRIVA RESPIMAT) 1.25 MCG/ACT AERS Inhale 2 puffs into the lungs daily. Patient not taking: Reported on 07/23/2022 08/24/19   Adelina Mings, MD  torsemide [facility]) 20 MG tablet Take by mouth. 08/25/21   [provider]  Travoprost, BAK Free, (TRAVATAN) 0.004 % SOLN ophthalmic solution Administer 1 drop to both eyes nightly.    [provider]  valACYclovir (VALTREX) 1000 MG tablet Take 1 tablet (1,000 mg total) by mouth daily. 04/26/19   Cirigliano, Garvin Fila, DO  Vitamin D, Ergocalciferol, (DRISDOL) 1.25 MG (50000 UNIT) CAPS capsule Take 50,000 Units by mouth once a week. 07/02/21   [provider]  vitamin E (VITAMIN E) 200 UNIT capsule Take 200 Units by mouth daily. Patient not taking: Reported on 07/23/2022    [provider]  VYZULTA 0.024 % SOLN Apply  1 drop to eye at bedtime. 12/31/19   [provider]                                                                                                                                    Past Surgical History Past Surgical History:  Procedure Laterality Date   ADRENALECTOMY     EYE SURGERY     HERNIA REPAIR     SINOSCOPY     SINUS ENDO WITH FUSION  10/17/2012   Procedure: SINUS ENDO WITH FUSION;  Surgeon: Ascencion Dike, MD;  Location: [facility];  Service: ENT;  Laterality: Bilateral;  Bilateral Ethmoidectomy,  Bilateral Maxillary Antrostomy, Bilateral Frontal Recess Exploration,   W/ Fusion Protocol   Family History Family History  Problem Relation Age of Onset   Diabetes Mother    Hypertension Mother    Hyperlipidemia Mother    Diabetes Father    Hypertension Father    Diabetes Sister    Hypertension Sister    Diabetes Brother    Hypertension Brother    Allergic rhinitis Neg Hx    Asthma Neg Hx    Eczema Neg Hx    Urticaria Neg Hx     Social History Social History   Tobacco Use   Smoking status: Never   Smokeless tobacco: Never  Vaping Use   Vaping Use: Never used  Substance Use Topics   Alcohol use: No   Drug use: No   Allergies Ace inhibitors, Ipratropium-albuterol, Latex, Lisinopril, Gabapentin, and Tizanidine  Review of Systems Review of Systems  All other systems reviewed and are negative.   Physical Exam Vital Signs  I have reviewed the triage vital signs BP (!) 121/98   Pulse 89   Temp 98.2 F (36.8 C) (Oral)   Resp 18   Ht 5' 3" (1.6 m)   Wt 102.1 kg   SpO2 94%   BMI 39.86 kg/m  Physical Exam Vitals and nursing note reviewed.  Constitutional:      General: He is not in acute distress.    Appearance: Normal appearance.  HENT:     Mouth/Throat:     Mouth: Mucous membranes are moist.  Eyes:     Conjunctiva/sclera: Conjunctivae normal.  Cardiovascular:     Rate and Rhythm: Normal rate and regular rhythm.  Pulmonary:      Comments: Mild tachypnea, mild increased work of breathing, diffuse rhonchorous breath sounds with some wheezing Abdominal:     General: Abdomen is flat.     Palpations: Abdomen is soft.     Tenderness: There is no abdominal tenderness.  Musculoskeletal:     Right lower leg: No edema.     Left lower leg: No edema.  Skin:    General: Skin is warm and dry.     Capillary Refill: Capillary refill takes less than 2 seconds.  Neurological:     Mental Status: He is alert and oriented to person, place, and time. Mental status is at baseline.  Psychiatric:        Mood and Affect: Mood normal.        Behavior: Behavior normal.     ED Results and Treatments Labs (all labs ordered are listed, but only abnormal results are displayed) Labs Reviewed  COMPREHENSIVE METABOLIC PANEL - Abnormal; Notable for the following components:      Result Value   Potassium 3.2 (*)    BUN 62 (*)    Creatinine, Ser 6.03 (*)    Calcium 8.8 (*)    Albumin 3.2 (*)    GFR, Estimated 10 (*)    All other components within normal limits  CBC WITH DIFFERENTIAL/PLATELET - Abnormal; Notable for the following components:   WBC 18.6 (*)    RBC 3.40 (*)    Hemoglobin 9.6 (*)    HCT 30.0 (*)    RDW 19.6 (*)    Platelets 491 (*)    nRBC 0.6 (*)    Neutro Abs 13.3 (*)    Monocytes Absolute 2.3 (*)    Abs Immature Granulocytes 0.98 (*)    All other components within normal limits  URINALYSIS, ROUTINE W REFLEX MICROSCOPIC - Abnormal; Notable for the following components:   Glucose, UA 100 (*)    Hgb urine dipstick MODERATE (*)    Protein, ur >=300 (*)    All other components within normal limits  URINALYSIS, MICROSCOPIC (REFLEX) - Abnormal; Notable for the following components:   Bacteria, UA RARE (*)    All other components within normal limits  I-STAT VENOUS BLOOD GAS, ED - Abnormal; Notable for the following components:   pCO2, Ven 38.8 (*)    pO2, Ven 29 (*)    Acid-base deficit 5.0 (*)    Potassium 3.3  (*)    HCT 27.0 (*)    Hemoglobin 9.2 (*)    All other components within normal limits  TROPONIN I (HIGH SENSITIVITY) - Abnormal; Notable for the following components:   Troponin I (High Sensitivity) 21 (*)    All other components within normal limits  TROPONIN I (HIGH SENSITIVITY) - Abnormal; Notable for the following components:   Troponin I (High Sensitivity)  21 (*)    All other components within normal limits  BRAIN NATRIURETIC PEPTIDE                                                                                                                          Radiology CT Chest Wo Contrast  Result Date: 10/11/2022 CLINICAL DATA:  Pneumonia, complication suspected, xray done EXAM: CT CHEST WITHOUT CONTRAST TECHNIQUE: Multidetector CT imaging of the chest was performed following the standard protocol without IV contrast. RADIATION DOSE REDUCTION: This exam was performed according to the departmental dose-optimization program which includes automated exposure control, adjustment of the mA and/or kV according to patient size and/or use of iterative reconstruction technique. COMPARISON:  X-ray 10/11/2022.  CT 07/02/2022 FINDINGS: Cardiovascular: Stable mild cardiomegaly. Small-moderate pericardial effusion is unchanged. Thoracic aorta is nonaneurysmal. Scattered atherosclerotic vascular calcifications of the aorta and coronary arteries. Central pulmonary vasculature is nondilated. Right IJ approach central venous line terminates at the superior cavoatrial junction. Mediastinum/Nodes: No enlarged mediastinal or axillary lymph nodes. Thyroid gland, trachea, and esophagus demonstrate no significant findings. Lungs/Pleura: Patchy bibasilar atelectasis or consolidation. Multifocal scattered areas of tree-in-bud nodularity within both lungs, notably within the anterior aspects of the right upper lobe and left upper lobe. No pleural effusion or pneumothorax. Upper Abdomen: No acute abnormality. Musculoskeletal:  Asymmetric right-sided gynecomastia, similar in appearance to prior and has been evaluated with dedicated diagnostic mammography and ultrasound. No acute bony findings. IMPRESSION: 1. Patchy bibasilar atelectasis or consolidation with multifocal scattered areas of tree-in-bud nodularity within both lungs, compatible with a multifocal infectious or inflammatory process. 2. Mild cardiomegaly with stable small-moderate pericardial effusion. 3. Aortic and coronary artery atherosclerosis (ICD10-I70.0). Electronically Signed   By: Davina Poke D.O.   On: 10/11/2022 11:41   DG Chest Port 1 View  Result Date: 10/11/2022 CLINICAL DATA:  56 year old male with cough, palpitations, chest pain, shortness of breath. EXAM: PORTABLE CHEST 1 VIEW COMPARISON:  Portable chest 07/31/2022 and earlier. FINDINGS: Portable AP upright view at 1043 hours. Mildly lower lung volumes. Stable cardiomegaly and mediastinal contours. Stable right chest dual lumen dialysis type catheter. Increased pulmonary vascular congestion. Mild blunting of both costophrenic angles. No pneumothorax or air bronchograms. Paucity of bowel gas in the upper abdomen. No acute osseous abnormality identified. IMPRESSION: Cardiomegaly with lower lung volumes and increased without vascular overt edema. But probable small bilateral pleural effusions. Electronically Signed   By: Genevie Ann M.D.   On: 10/11/2022 10:58    Pertinent labs & imaging results that were available during my care of the patient were reviewed by me and considered in my medical decision making (see MDM for details).  Medications Ordered in ED Medications  azithromycin (ZITHROMAX) 500 mg in sodium chloride 0.9 % 250 mL IVPB (has no administration in time range)  cefTRIAXone (ROCEPHIN) 2 g in sodium chloride 0.9 % 100 mL IVPB (has no administration in time range)  ipratropium-albuterol (DUONEB) 0.5-2.5 (3) MG/3ML nebulizer solution (3 mLs  Given 10/11/22  1034)  albuterol (VENTOLIN) (5  MG/ML) 0.5% continuous inhalation solution (2.5 mg  Given 10/11/22 1034)  furosemide (LASIX) injection 40 mg (40 mg Intravenous Given 10/11/22 1133)  potassium chloride (KLOR-CON) packet 20 mEq (20 mEq Oral Given 10/11/22 1223)  cefTRIAXone (ROCEPHIN) 1 g in sodium chloride 0.9 % 100 mL IVPB (0 g Intravenous Stopped 10/11/22 1342)                                                                                                                                     Procedures .Critical Care  Performed by: Cristie Hem, MD Authorized by: Cristie Hem, MD   Critical care provider statement:    Critical care time (minutes):  30   Critical care time was exclusive of:  Separately billable procedures and treating other patients   Critical care was necessary to treat or prevent imminent or life-threatening deterioration of the following conditions:  Respiratory failure and sepsis   Critical care was time spent personally by me on the following activities:  Development of treatment plan with patient or surrogate, discussions with consultants, evaluation of patient's response to treatment, examination of patient, ordering and review of laboratory studies, ordering and review of radiographic studies, ordering and performing treatments and interventions, pulse oximetry, re-evaluation of patient's condition and review of old charts   Care discussed with: admitting provider     (including critical care time)  Medical Decision Making / ED Course   MDM:  56 year old male presenting to the emergency department with shortness of breath.  Patient overall in mild respiratory distress.  He was hypoxic on room air.  He has diffuse rhonchorous breath sounds.  Differential includes acute exacerbation of asthma, hypervolemia, CHF, hypercapnic respiratory failure, pneumonia, less likely pulmonary embolism as patient is on chronic home Eliquis, less likely ACS as patient chest pain is pleuritic and not  exertional, EKG not suggestive of ACS.  Will obtain lab testing, chest x-ray without evidence of clear pneumonia, given breath sounds will obtain CT chest without contrast to further evaluate for pneumonia.  Still hypoxic he will need admission for likely dialysis and further management Clinical Course as of 10/11/22 1616  Sun Oct 11, 2022  1153 Resp(!): 31 Patient with persistent tachypnea and unstable vital signs. [WS]  0254 Discussed with hospitalist, Dr. Tamala Julian, who agrees with admission, requests addional dose of antibiotics.  [WS]    Clinical Course User Index [WS] Cristie Hem, MD     Additional history obtained: -External records from outside source obtained and reviewed including: Chart review including previous notes, labs, imaging, consultation notes including pmd visit 10/07/22   Lab Tests: -I ordered, reviewed, and interpreted labs.   The pertinent results include:   Labs Reviewed  COMPREHENSIVE METABOLIC PANEL - Abnormal; Notable for the following components:      Result Value   Potassium 3.2 (*)    BUN 62 (*)  Creatinine, Ser 6.03 (*)    Calcium 8.8 (*)    Albumin 3.2 (*)    GFR, Estimated 10 (*)    All other components within normal limits  CBC WITH DIFFERENTIAL/PLATELET - Abnormal; Notable for the following components:   WBC 18.6 (*)    RBC 3.40 (*)    Hemoglobin 9.6 (*)    HCT 30.0 (*)    RDW 19.6 (*)    Platelets 491 (*)    nRBC 0.6 (*)    Neutro Abs 13.3 (*)    Monocytes Absolute 2.3 (*)    Abs Immature Granulocytes 0.98 (*)    All other components within normal limits  URINALYSIS, ROUTINE W REFLEX MICROSCOPIC - Abnormal; Notable for the following components:   Glucose, UA 100 (*)    Hgb urine dipstick MODERATE (*)    Protein, ur >=300 (*)    All other components within normal limits  URINALYSIS, MICROSCOPIC (REFLEX) - Abnormal; Notable for the following components:   Bacteria, UA RARE (*)    All other components within normal limits   I-STAT VENOUS BLOOD GAS, ED - Abnormal; Notable for the following components:   pCO2, Ven 38.8 (*)    pO2, Ven 29 (*)    Acid-base deficit 5.0 (*)    Potassium 3.3 (*)    HCT 27.0 (*)    Hemoglobin 9.2 (*)    All other components within normal limits  TROPONIN I (HIGH SENSITIVITY) - Abnormal; Notable for the following components:   Troponin I (High Sensitivity) 21 (*)    All other components within normal limits  TROPONIN I (HIGH SENSITIVITY) - Abnormal; Notable for the following components:   Troponin I (High Sensitivity) 21 (*)    All other components within normal limits  BRAIN NATRIURETIC PEPTIDE    Notable for leukocytosis  EKG   EKG Interpretation  Date/Time:  Sunday October 11 2022 10:28:19 EDT Ventricular Rate:  87 PR Interval:  197 QRS Duration: 98 QT Interval:  356 QTC Calculation: 429 R Axis:   77 Text Interpretation: Sinus rhythm Probable left atrial enlargement Confirmed by Garnette Gunner [phone]) on 10/11/2022 11:23:25 AM         Imaging Studies ordered: I ordered imaging studies including CXR/CT chest On my interpretation imaging demonstrates pneumonia I independently visualized and interpreted imaging. I agree with the radiologist interpretation   Medicines ordered and prescription drug management: Meds ordered this encounter  Medications   ipratropium-albuterol (DUONEB) 0.5-2.5 (3) MG/3ML nebulizer solution    Gaynelle Arabian B: cabinet override   albuterol (VENTOLIN) (5 MG/ML) 0.5% continuous inhalation solution    Gaynelle Arabian B: cabinet override   furosemide (LASIX) injection 40 mg   DISCONTD: cefTRIAXone (ROCEPHIN) 1 g in sodium chloride 0.9 % 100 mL IVPB    Order Specific Question:   Antibiotic Indication:    Answer:   CAP   azithromycin (ZITHROMAX) 500 mg in sodium chloride 0.9 % 250 mL IVPB   potassium chloride (KLOR-CON) packet 20 mEq   cefTRIAXone (ROCEPHIN) 2 g in sodium chloride 0.9 % 100 mL IVPB    Order Specific Question:    Antibiotic Indication:    Answer:   CAP   cefTRIAXone (ROCEPHIN) 1 g in sodium chloride 0.9 % 100 mL IVPB    Order Specific Question:   Antibiotic Indication:    Answer:   CAP    -I have reviewed the patients home medicines and have made adjustments as needed   Consultations Obtained: I requested consultation  with the hospitalist,  and discussed lab and imaging findings as well as pertinent plan - they recommend: admission   Cardiac Monitoring: The patient was maintained on a cardiac monitor.  I personally viewed and interpreted the cardiac monitored which showed an underlying rhythm of: sins     Reevaluation: After the interventions noted above, I reevaluated the patient and found that they have improved  Co morbidities that complicate the patient evaluation  Past Medical History:  Diagnosis Date   A-fib ([place])    Asthma    Blindness of left eye    Chronic kidney disease    Diabetes mellitus without complication (HCC)    Dyspnea    ESRD (end stage renal disease) ([place])    Gynecomastia    Hypertension    Left knee pain    SVT (supraventricular tachycardia)       Dispostion: Disposition decision including need for hospitalization was considered, and patient admitted to the hospital.    Final Clinical Impression(s) / ED Diagnoses Final diagnoses:  Community acquired pneumonia, unspecified laterality     This chart was dictated using voice recognition software.  Despite best efforts to proofread,  errors can occur which can change the documentation meaning.    Cristie Hem, MD 10/11/22 1616

## 2022-10-12 DIAGNOSIS — N186 End stage renal disease: Secondary | ICD-10-CM

## 2022-10-12 DIAGNOSIS — J9601 Acute respiratory failure with hypoxia: Secondary | ICD-10-CM | POA: Diagnosis not present

## 2022-10-12 DIAGNOSIS — I10 Essential (primary) hypertension: Secondary | ICD-10-CM

## 2022-10-12 DIAGNOSIS — J189 Pneumonia, unspecified organism: Secondary | ICD-10-CM | POA: Diagnosis not present

## 2022-10-12 DIAGNOSIS — E118 Type 2 diabetes mellitus with unspecified complications: Secondary | ICD-10-CM

## 2022-10-12 LAB — CBC WITH DIFFERENTIAL/PLATELET
Abs Immature Granulocytes: 0.84 10*3/uL — ABNORMAL HIGH (ref 0.00–0.07)
Basophils Absolute: 0.1 10*3/uL (ref 0.0–0.1)
Basophils Relative: 1 %
Eosinophils Absolute: 0.3 10*3/uL (ref 0.0–0.5)
Eosinophils Relative: 2 %
HCT: 27.2 % — ABNORMAL LOW (ref 39.0–52.0)
Hemoglobin: 8.8 g/dL — ABNORMAL LOW (ref 13.0–17.0)
Immature Granulocytes: 5 %
Lymphocytes Relative: 9 %
Lymphs Abs: 1.6 10*3/uL (ref 0.7–4.0)
MCH: 28.6 pg (ref 26.0–34.0)
MCHC: 32.4 g/dL (ref 30.0–36.0)
MCV: 88.3 fL (ref 80.0–100.0)
Monocytes Absolute: 2 10*3/uL — ABNORMAL HIGH (ref 0.1–1.0)
Monocytes Relative: 12 %
Neutro Abs: 11.7 10*3/uL — ABNORMAL HIGH (ref 1.7–7.7)
Neutrophils Relative %: 71 %
Platelets: 485 10*3/uL — ABNORMAL HIGH (ref 150–400)
RBC: 3.08 MIL/uL — ABNORMAL LOW (ref 4.22–5.81)
RDW: 19.4 % — ABNORMAL HIGH (ref 11.5–15.5)
WBC: 16.5 10*3/uL — ABNORMAL HIGH (ref 4.0–10.5)
nRBC: 0.2 % (ref 0.0–0.2)

## 2022-10-12 LAB — COMPREHENSIVE METABOLIC PANEL
ALT: 15 U/L (ref 0–44)
AST: 14 U/L — ABNORMAL LOW (ref 15–41)
Albumin: 2.7 g/dL — ABNORMAL LOW (ref 3.5–5.0)
Alkaline Phosphatase: 60 U/L (ref 38–126)
Anion gap: 9 (ref 5–15)
BUN: 63 mg/dL — ABNORMAL HIGH (ref 6–20)
CO2: 20 mmol/L — ABNORMAL LOW (ref 22–32)
Calcium: 8.6 mg/dL — ABNORMAL LOW (ref 8.9–10.3)
Chloride: 111 mmol/L (ref 98–111)
Creatinine, Ser: 6.05 mg/dL — ABNORMAL HIGH (ref 0.61–1.24)
GFR, Estimated: 10 mL/min — ABNORMAL LOW (ref >60)
Glucose, Bld: 162 mg/dL — ABNORMAL HIGH (ref 70–99)
Potassium: 3.2 mmol/L — ABNORMAL LOW (ref 3.5–5.1)
Sodium: 140 mmol/L (ref 135–145)
Total Bilirubin: 0.4 mg/dL (ref 0.3–1.2)
Total Protein: 6 g/dL — ABNORMAL LOW (ref 6.5–8.1)

## 2022-10-12 LAB — MAGNESIUM: Magnesium: 1.7 mg/dL (ref 1.7–2.4)

## 2022-10-12 LAB — PHOSPHORUS: Phosphorus: 4.1 mg/dL (ref 2.5–4.6)

## 2022-10-12 LAB — SARS CORONAVIRUS 2 BY RT PCR: SARS Coronavirus 2 by RT PCR: NEGATIVE

## 2022-10-12 MED ORDER — ADULT MULTIVITAMIN W/MINERALS CH
ORAL_TABLET | Freq: Every day | ORAL | Status: DC
  Administered 2022-10-12: 1 via ORAL
  Filled 2022-10-12 (×2): qty 1

## 2022-10-12 MED ORDER — CARVEDILOL 3.125 MG PO TABS
3.1250 mg | ORAL_TABLET | Freq: Two times a day (BID) | ORAL | Status: DC
  Administered 2022-10-12: 3.125 mg via ORAL
  Filled 2022-10-12 (×2): qty 1

## 2022-10-12 MED ORDER — AZITHROMYCIN 500 MG PO TABS: 500.0000 mg | ORAL_TABLET | Freq: Every day | ORAL | 0 refills | Status: AC

## 2022-10-12 MED ORDER — CEFDINIR 300 MG PO CAPS
300.0000 mg | ORAL_CAPSULE | Freq: Once | ORAL | Status: AC
  Administered 2022-10-12: 300 mg via ORAL
  Filled 2022-10-12: qty 1

## 2022-10-12 MED ORDER — PROCHLORPERAZINE EDISYLATE 10 MG/2ML IJ SOLN: 5.0000 mg | Freq: Four times a day (QID) | INTRAMUSCULAR | Status: DC | PRN

## 2022-10-12 MED ORDER — ORAL CARE MOUTH RINSE: 15.0000 mL | OROMUCOSAL | Status: DC | PRN

## 2022-10-12 MED ORDER — FEBUXOSTAT 40 MG PO TABS
40.0000 mg | ORAL_TABLET | Freq: Every day | ORAL | Status: DC
  Administered 2022-10-12: 40 mg via ORAL
  Filled 2022-10-12: qty 1

## 2022-10-12 MED ORDER — CEFDINIR 300 MG PO CAPS: ORAL_CAPSULE | ORAL | 0 refills | Status: DC

## 2022-10-12 MED ORDER — POLYETHYLENE GLYCOL 3350 17 G PO PACK: 17.0000 g | PACK | Freq: Every day | ORAL | Status: DC | PRN

## 2022-10-12 MED ORDER — ATORVASTATIN CALCIUM 40 MG PO TABS
40.0000 mg | ORAL_TABLET | Freq: Every day | ORAL | Status: DC
  Administered 2022-10-12: 40 mg via ORAL
  Filled 2022-10-12: qty 1

## 2022-10-12 MED ORDER — HEPARIN SODIUM (PORCINE) 5000 UNIT/ML IJ SOLN: 5000.0000 [IU] | Freq: Three times a day (TID) | INTRAMUSCULAR | Status: DC

## 2022-10-12 MED ORDER — PANTOPRAZOLE SODIUM 40 MG PO TBEC
40.0000 mg | DELAYED_RELEASE_TABLET | Freq: Every day | ORAL | Status: DC
  Administered 2022-10-12: 40 mg via ORAL
  Filled 2022-10-12: qty 1

## 2022-10-12 MED ORDER — ASPIRIN 81 MG PO TBEC
81.0000 mg | DELAYED_RELEASE_TABLET | Freq: Every day | ORAL | Status: DC
  Administered 2022-10-12: 81 mg via ORAL
  Filled 2022-10-12: qty 1

## 2022-10-12 MED ORDER — MELATONIN 5 MG PO TABS: 5.0000 mg | ORAL_TABLET | Freq: Every evening | ORAL | Status: DC | PRN

## 2022-10-12 MED ORDER — AZITHROMYCIN 500 MG PO TABS
500.0000 mg | ORAL_TABLET | Freq: Once | ORAL | Status: AC
  Administered 2022-10-12: 500 mg via ORAL
  Filled 2022-10-12: qty 1

## 2022-10-12 MED ORDER — ACETAMINOPHEN 325 MG PO TABS: 650.0000 mg | ORAL_TABLET | Freq: Four times a day (QID) | ORAL | Status: DC | PRN

## 2022-10-12 MED ORDER — LIDOCAINE 5 % EX PTCH
1.0000 | MEDICATED_PATCH | CUTANEOUS | Status: DC
  Administered 2022-10-12: 1 via TRANSDERMAL
  Filled 2022-10-12: qty 1

## 2022-10-12 MED ORDER — LIDOCAINE 5 % EX PTCH: 1.0000 | MEDICATED_PATCH | CUTANEOUS | Status: DC

## 2022-10-12 MED ORDER — MONTELUKAST SODIUM 10 MG PO TABS
10.0000 mg | ORAL_TABLET | Freq: Every day | ORAL | Status: DC
  Administered 2022-10-12: 10 mg via ORAL
  Filled 2022-10-12: qty 1

## 2022-10-12 MED ORDER — NEOMYCIN-POLYMYXIN-HC 3.5-10000-1 OT SOLN
3.0000 [drp] | Freq: Three times a day (TID) | OTIC | Status: DC
  Administered 2022-10-12: 3 [drp] via OTIC
  Filled 2022-10-12: qty 10

## 2022-10-12 NOTE — TOC Transition Note (Signed)
Transition of Care (TOC) - CM/SW Discharge Note Marvetta Gibbons RN, BSN Transitions of Care Unit 4E- RN Case Manager See Treatment Team for direct phone #    Patient Details  Name: Nicholis Stepanek MRN: [id number] Date of Birth: [birth year]  Transition of Care [facility] CM/SW Contact:  Dawayne Patricia, RN Phone Number: 10/12/2022, 1:27 PM   Clinical Narrative:    Pt stable for transition home today, Pt ESRD w/ HD TTS.  Transition of Care Department [facility]) has reviewed patient and no TOC needs have been identified at this time.  Pt has PCP, insurance and medication coverage, No barriers identified.   Final next level of care: Home/Self Care Barriers to Discharge: No Barriers Identified   Patient Goals and CMS Choice     Choice offered to / list presented to : NA  Discharge Placement               Home        Discharge Plan and Services In-house Referral: NA Discharge Planning Services: NA Post Acute Care Choice: NA          DME Arranged: N/A DME Agency: NA       HH Arranged: NA HH Agency: NA        Social Determinants of Health (SDOH) Interventions     Readmission Risk Interventions    10/12/2022    1:26 PM  Readmission Risk Prevention Plan  Transportation Screening Complete  HRI or [place] Complete  Social Work Consult for [place] Planning/Counseling Complete  Palliative Care Screening Not Applicable  Medication Review [employer]) Complete

## 2022-10-12 NOTE — Care Management CC44 (Signed)
Condition Code 44 Documentation Completed  Patient Details  Name: Gary Buchanan MRN: [id number] Date of Birth: Dec 17, 1966   Condition Code 44 given:  Yes Patient signature on Condition Code 44 notice:  Yes Documentation of 2 MD's agreement:  Yes Code 44 added to claim:  Yes    Dawayne Patricia, RN 10/12/2022, 2:41 PM

## 2022-10-12 NOTE — Progress Notes (Signed)
SATURATION QUALIFICATIONS: (This note is used to comply with regulatory documentation for home oxygen)  Patient Saturations on Room Air at Rest = 97%  Patient Saturations on Room Air while Ambulating = 90%  Patient Saturations on n/a Liters of oxygen while Ambulating = n/a%  Please briefly explain why patient needs home oxygen:

## 2022-10-12 NOTE — Discharge Summary (Signed)
Physician Discharge Summary   Patient: Gary Buchanan MRN: [id number] DOB: Oct 17, 1966  Admit date:     10/11/2022  Discharge date: 10/12/22  Discharge Physician: Cordelia Poche, MD   PCP: Clarisse Gouge, PA-C   Recommendations at discharge:  Resume HD schedule Follow-up with PCP  Discharge Diagnoses: Principal Problem:   Multifocal pneumonia Active Problems:   Type 2 diabetes mellitus with complication, without long-term current use of insulin ([place])   Hypertension   Morbid obesity ([place])   Acute respiratory failure with hypoxia ([place])   ESRD (end stage renal disease) ([place])  Resolved Problems:   * No resolved hospital problems. *  Hospital Course: Darin Arndt is a 56 y.o. male with a history of ESRD on HD, paroxysmal atrial fibrillation, asthma, diabetes mellitus, hyperlipidemia and obesity. Patient presented secondary to shortness of breath and cough with evidence of likely multifocal pneumonia on chest x-ray. Although patient missed most recent HD, no evidence of pulmonary edema on imaging. Patient treated empirically with Ceftriaxone and Azithromycin. No hypoxia. Patient transitioned to Cefdinir and azithromycin on discharge.  Assessment and Plan:  Multifocal pneumonia Noted multifocal infiltrates concerning for possible infectious source. Patient started empirically on Ceftriaxone and Azithromycin and transitioned to Cefdinir and Azithromycin. Patient initially requiring oxygen which was discontinued without hypoxia.  Asthma Controlled. No exacerbation at this time. Does not appear to have outpatient medications for management.  Diabetes mellitus, type 2 Continue Januvia. Most recent hemoglobin A1C from 2020 of 6.5%.  ESRD on HD Noted. Patient missed most recent hemodialysis session. No urgent indicator for HD at this time. Patient to resume home hemodialysis on discharge.  Primary hypertension Continue Coreg.  Otitis media Previously diagnosed. Continue  Cortisporin.  Obesity Estimated body mass index is 39.86 kg/m as calculated from the following:   Height as of this encounter: '5\' 3"'$  (1.6 m).   Weight as of this encounter: 102.1 kg.   Consultants: None Procedures performed: None  Disposition: Home Diet recommendation: Renal diet   DISCHARGE MEDICATION: Allergies as of 10/12/2022       Reactions   Ace Inhibitors Swelling   Lips swelling   Combivent Respimat [ipratropium-albuterol] Palpitations   Latex Itching   Zestril [lisinopril] Swelling   Lip swelling   Neurontin [gabapentin] Other (See Comments)   Rapid heartbeat   Zanaflex [tizanidine] Other (See Comments)   Chest pain        Medication List     STOP taking these medications    clobetasol ointment 0.05 % Commonly known as: TEMOVATE   predniSONE 10 MG tablet Commonly known as: DELTASONE       TAKE these medications    amLODipine 10 MG tablet Commonly known as: NORVASC Take 1 tablet (10 mg total) by mouth daily.   aspirin EC 81 MG tablet Take 81 mg by mouth daily.   atorvastatin 40 MG tablet Commonly known as: LIPITOR Take 40 mg by mouth daily.   azithromycin 500 MG tablet Commonly known as: Zithromax Take 1 tablet (500 mg total) by mouth daily for 3 days. Start taking on: October 13, 2022   calcitRIOL 0.5 MCG capsule Commonly known as: ROCALTROL Take 0.5 mcg by mouth daily.   carvedilol 3.125 MG tablet Commonly known as: COREG Take 3.125 mg by mouth 2 (two) times daily with a meal.   cefdinir 300 MG capsule Commonly known as: OMNICEF Take 1 capsule (300 mg total) by mouth after each hemodialysis session on Tuesday, Thursday and Saturday.   FOLIC ACID PO Take  1 tablet by mouth daily.   magnesium oxide 400 MG tablet Commonly known as: MAG-OX Take 400 mg by mouth 2 (two) times daily.   multivitamin tablet Take 1 tablet by mouth daily.   neomycin-polymyxin-hydrocortisone OTIC solution Commonly known as: CORTISPORIN Place 3  drops into the left ear 3 (three) times daily. 10 day course.   Simbrinza 1-0.2 % Susp Generic drug: Brinzolamide-Brimonidine Place 1 drop into both eyes daily.   sitaGLIPtin 25 MG tablet Commonly known as: JANUVIA Take 25 mg by mouth daily.   timolol 0.25 % ophthalmic solution Commonly known as: TIMOPTIC Place 1 drop into the right eye daily.   torsemide 20 MG tablet Commonly known as: DEMADEX Take 20 mg by mouth daily.   valACYclovir 1000 MG tablet Commonly known as: VALTREX Take 1 tablet (1,000 mg total) by mouth daily.   VITAMIN B-6 PO Take 1 tablet by mouth daily.   Vitamin D (Ergocalciferol) 1.25 MG (50000 UNIT) Caps capsule Commonly known as: DRISDOL Take 50,000 Units by mouth once a week.   Vyzulta 0.024 % Soln Generic drug: Latanoprostene Bunod Apply 1 drop to eye at bedtime.        Follow-up Information     Clarisse Gouge, PA-C. Schedule an appointment as soon as possible for a visit in 1 week(s).   Specialty: Physician Assistant Why: For hospital follow-up Contact information: [place] [address] [phone]                Discharge Exam: BP 139/87 (BP Location: Left Arm)   Pulse 91   Temp 98.1 F (36.7 C) (Oral)   Resp (!) 21   Ht '5\' 3"'$  (1.6 m)   Wt 102.1 kg   SpO2 96%   BMI 39.86 kg/m   General exam: Appears calm and comfortable Respiratory system: Clear to auscultation. Respiratory effort normal. Cardiovascular system: S1 & S2 heard, RRR. No murmurs, rubs, gallops or clicks. Gastrointestinal system: Abdomen is nondistended, soft and nontender. Normal bowel sounds heard. Central nervous system: Alert and oriented. No focal neurological deficits. Musculoskeletal: No edema. No calf tenderness Skin: No cyanosis. No rashes Psychiatry: Judgement and insight appear normal. Mood & affect appropriate.   Condition at discharge: stable  The results of significant diagnostics from this hospitalization  (including imaging, microbiology, ancillary and laboratory) are listed below for reference.   Imaging Studies: CT Chest Wo Contrast  Result Date: 10/11/2022 CLINICAL DATA:  Pneumonia, complication suspected, xray done EXAM: CT CHEST WITHOUT CONTRAST TECHNIQUE: Multidetector CT imaging of the chest was performed following the standard protocol without IV contrast. RADIATION DOSE REDUCTION: This exam was performed according to the departmental dose-optimization program which includes automated exposure control, adjustment of the mA and/or kV according to patient size and/or use of iterative reconstruction technique. COMPARISON:  X-ray 10/11/2022.  CT 07/02/2022 FINDINGS: Cardiovascular: Stable mild cardiomegaly. Small-moderate pericardial effusion is unchanged. Thoracic aorta is nonaneurysmal. Scattered atherosclerotic vascular calcifications of the aorta and coronary arteries. Central pulmonary vasculature is nondilated. Right IJ approach central venous line terminates at the superior cavoatrial junction. Mediastinum/Nodes: No enlarged mediastinal or axillary lymph nodes. Thyroid gland, trachea, and esophagus demonstrate no significant findings. Lungs/Pleura: Patchy bibasilar atelectasis or consolidation. Multifocal scattered areas of tree-in-bud nodularity within both lungs, notably within the anterior aspects of the right upper lobe and left upper lobe. No pleural effusion or pneumothorax. Upper Abdomen: No acute abnormality. Musculoskeletal: Asymmetric right-sided gynecomastia, similar in appearance to prior and has been evaluated with  dedicated diagnostic mammography and ultrasound. No acute bony findings. IMPRESSION: 1. Patchy bibasilar atelectasis or consolidation with multifocal scattered areas of tree-in-bud nodularity within both lungs, compatible with a multifocal infectious or inflammatory process. 2. Mild cardiomegaly with stable small-moderate pericardial effusion. 3. Aortic and coronary artery  atherosclerosis (ICD10-I70.0). Electronically Signed   By: Davina Poke D.O.   On: 10/11/2022 11:41   DG Chest Port 1 View  Result Date: 10/11/2022 CLINICAL DATA:  56 year old male with cough, palpitations, chest pain, shortness of breath. EXAM: PORTABLE CHEST 1 VIEW COMPARISON:  Portable chest 07/31/2022 and earlier. FINDINGS: Portable AP upright view at 1043 hours. Mildly lower lung volumes. Stable cardiomegaly and mediastinal contours. Stable right chest dual lumen dialysis type catheter. Increased pulmonary vascular congestion. Mild blunting of both costophrenic angles. No pneumothorax or air bronchograms. Paucity of bowel gas in the upper abdomen. No acute osseous abnormality identified. IMPRESSION: Cardiomegaly with lower lung volumes and increased without vascular overt edema. But probable small bilateral pleural effusions. Electronically Signed   By: Genevie Ann M.D.   On: 10/11/2022 10:58    Microbiology: No results found for this or any previous visit.  Labs: CBC: Recent Labs  Lab 10/11/22 1046 10/11/22 1106 10/12/22 0117  WBC 18.6*  --  16.5*  NEUTROABS 13.3*  --  11.7*  HGB 9.6* 9.2* 8.8*  HCT 30.0* 27.0* 27.2*  MCV 88.2  --  88.3  PLT 491*  --  161*   Basic Metabolic Panel: Recent Labs  Lab 10/11/22 1046 10/11/22 1106 10/12/22 0117  NA 140 140 140  K 3.2* 3.3* 3.2*  CL 109  --  111  CO2 22  --  20*  GLUCOSE 92  --  162*  BUN 62*  --  63*  CREATININE 6.03*  --  6.05*  CALCIUM 8.8*  --  8.6*  MG  --   --  1.7  PHOS  --   --  4.1   Liver Function Tests: Recent Labs  Lab 10/11/22 1046 10/12/22 0117  AST 19 14*  ALT 16 15  ALKPHOS 78 60  BILITOT 0.5 0.4  PROT 7.3 6.0*  ALBUMIN 3.2* 2.7*    Discharge time spent: 35 minutes.  Signed: Cordelia Poche, MD [facility] 10/12/2022

## 2022-10-12 NOTE — Care Management Obs Status (Signed)
[place] NOTIFICATION   Patient Details  Name: Gary Buchanan MRN: [id number] Date of Birth: 1966/12/09   Medicare Observation Status Notification Given:  Yes    Dawayne Patricia, RN 10/12/2022, 2:41 PM

## 2022-10-12 NOTE — Progress Notes (Signed)
COVID negative. Per Dr. Lonny Prude, okay to discharge to home.  IV and telemetry removed, CCMD notified. Discharge education reviewed. Pt transported to home by sister.

## 2022-10-14 DIAGNOSIS — N186 End stage renal disease: Secondary | ICD-10-CM

## 2022-10-14 NOTE — Hospital Course (Signed)
Gary Buchanan is a 56 y.o. male with a history of ESRD on HD, paroxysmal atrial fibrillation, asthma, diabetes mellitus, hyperlipidemia and obesity. Patient presented secondary to shortness of breath and cough with evidence of likely multifocal pneumonia on chest x-ray. Although patient missed most recent HD, no evidence of pulmonary edema on imaging. Patient treated empirically with Ceftriaxone and Azithromycin. No hypoxia. Patient transitioned to Cefdinir and azithromycin on discharge.

## 2022-10-19 ENCOUNTER — Emergency Department (HOSPITAL_COMMUNITY): Payer: Medicare Other

## 2022-10-19 ENCOUNTER — Other Ambulatory Visit: Payer: Self-pay

## 2022-10-19 ENCOUNTER — Encounter (HOSPITAL_COMMUNITY): Payer: Self-pay

## 2022-10-19 ENCOUNTER — Emergency Department (HOSPITAL_COMMUNITY)
Admission: EM | Admit: 2022-10-19 | Discharge: 2022-10-19 | Disposition: A | Discharge status: Home / Self Care | Payer: Medicare Other | Attending: Emergency Medicine | Admitting: Emergency Medicine

## 2022-10-19 DIAGNOSIS — Z7984 Long term (current) use of oral hypoglycemic drugs: Secondary | ICD-10-CM | POA: Diagnosis not present

## 2022-10-19 DIAGNOSIS — E1122 Type 2 diabetes mellitus with diabetic chronic kidney disease: Secondary | ICD-10-CM | POA: Insufficient documentation

## 2022-10-19 DIAGNOSIS — Z79899 Other long term (current) drug therapy: Secondary | ICD-10-CM | POA: Diagnosis not present

## 2022-10-19 DIAGNOSIS — J45909 Unspecified asthma, uncomplicated: Secondary | ICD-10-CM | POA: Diagnosis not present

## 2022-10-19 DIAGNOSIS — R0602 Shortness of breath: Secondary | ICD-10-CM | POA: Diagnosis present

## 2022-10-19 DIAGNOSIS — R531 Weakness: Secondary | ICD-10-CM | POA: Diagnosis not present

## 2022-10-19 DIAGNOSIS — R5383 Other fatigue: Secondary | ICD-10-CM | POA: Insufficient documentation

## 2022-10-19 DIAGNOSIS — Z91040 Latex allergy status: Secondary | ICD-10-CM | POA: Diagnosis not present

## 2022-10-19 DIAGNOSIS — N186 End stage renal disease: Secondary | ICD-10-CM | POA: Insufficient documentation

## 2022-10-19 DIAGNOSIS — Z992 Dependence on renal dialysis: Secondary | ICD-10-CM | POA: Insufficient documentation

## 2022-10-19 DIAGNOSIS — I471 Supraventricular tachycardia, unspecified: Secondary | ICD-10-CM | POA: Diagnosis not present

## 2022-10-19 DIAGNOSIS — Z7982 Long term (current) use of aspirin: Secondary | ICD-10-CM | POA: Insufficient documentation

## 2022-10-19 DIAGNOSIS — Z7951 Long term (current) use of inhaled steroids: Secondary | ICD-10-CM | POA: Diagnosis not present

## 2022-10-19 LAB — CBC WITH DIFFERENTIAL/PLATELET
Abs Immature Granulocytes: 0.75 10*3/uL — ABNORMAL HIGH (ref 0.00–0.07)
Basophils Absolute: 0.1 10*3/uL (ref 0.0–0.1)
Basophils Relative: 1 %
Eosinophils Absolute: 0.3 10*3/uL (ref 0.0–0.5)
Eosinophils Relative: 2 %
HCT: 28.8 % — ABNORMAL LOW (ref 39.0–52.0)
Hemoglobin: 9 g/dL — ABNORMAL LOW (ref 13.0–17.0)
Immature Granulocytes: 5 %
Lymphocytes Relative: 13 %
Lymphs Abs: 1.8 10*3/uL (ref 0.7–4.0)
MCH: 28.4 pg (ref 26.0–34.0)
MCHC: 31.3 g/dL (ref 30.0–36.0)
MCV: 90.9 fL (ref 80.0–100.0)
Monocytes Absolute: 1.4 10*3/uL — ABNORMAL HIGH (ref 0.1–1.0)
Monocytes Relative: 10 %
Neutro Abs: 9.8 10*3/uL — ABNORMAL HIGH (ref 1.7–7.7)
Neutrophils Relative %: 69 %
Platelets: 429 10*3/uL — ABNORMAL HIGH (ref 150–400)
RBC: 3.17 MIL/uL — ABNORMAL LOW (ref 4.22–5.81)
RDW: 20.3 % — ABNORMAL HIGH (ref 11.5–15.5)
WBC: 14.1 10*3/uL — ABNORMAL HIGH (ref 4.0–10.5)
nRBC: 0.6 % — ABNORMAL HIGH (ref 0.0–0.2)

## 2022-10-19 LAB — BASIC METABOLIC PANEL
Anion gap: 10 (ref 5–15)
BUN: 40 mg/dL — ABNORMAL HIGH (ref 6–20)
CO2: 21 mmol/L — ABNORMAL LOW (ref 22–32)
Calcium: 8.6 mg/dL — ABNORMAL LOW (ref 8.9–10.3)
Chloride: 113 mmol/L — ABNORMAL HIGH (ref 98–111)
Creatinine, Ser: 4.44 mg/dL — ABNORMAL HIGH (ref 0.61–1.24)
GFR, Estimated: 15 mL/min — ABNORMAL LOW (ref >60)
Glucose, Bld: 73 mg/dL (ref 70–99)
Potassium: 3.7 mmol/L (ref 3.5–5.1)
Sodium: 144 mmol/L (ref 135–145)

## 2022-10-19 LAB — TSH: TSH: 1.464 u[IU]/mL (ref 0.350–4.500)

## 2022-10-19 LAB — MAGNESIUM: Magnesium: 1.7 mg/dL (ref 1.7–2.4)

## 2022-10-19 MED ORDER — CALCITRIOL 0.5 MCG PO CAPS: 0.5000 ug | ORAL_CAPSULE | Freq: Every day | ORAL | 1 refills | Status: AC

## 2022-10-19 MED ORDER — CARVEDILOL 3.125 MG PO TABS: 3.1250 mg | ORAL_TABLET | Freq: Two times a day (BID) | ORAL | 1 refills | Status: DC

## 2022-10-19 NOTE — ED Triage Notes (Signed)
Pt bib [facility] from [facility] after going there for weakness, fatigue, and shob/cp. Upon arrival, pt found to be in SVT at a rate of 190. Pt given '6mg'$  adenosine and then '12mg'$  more adenosine. After the second dose, the pt converted with a rate of 90-100. Pt arrives with no complaints. Last dialysis treatment was Saturday.  EMS vitals  130/90, 90HR,

## 2022-10-19 NOTE — Discharge Instructions (Signed)
You were seen in the emergency department after having an episode of irregularly fast heart rate and irregular heart rhythm which was controlled with medication given by EMS.  Overall your work-up including x-ray, labs and EKG were reassuring.  Make sure to take your Coreg as prescribed and follow-up with your primary care physician and cardiologist regarding your visit to the ER today.  If your heart rate becomes irregularly fast again or you have developed severe chest pain, worsening shortness of breath or any other concerning symptoms, come back to the ER or call 911.

## 2022-10-19 NOTE — ED Provider Notes (Signed)
[place] EMERGENCY DEPARTMENT Provider Note   CSN: [id number] Arrival date & time: 10/19/22  1341     History {Add pertinent medical, surgical, social history, OB history to HPI:1} No chief complaint on file.   Gary Buchanan is a 56 y.o. male.  HPI     Home Medications Prior to Admission medications   Medication Sig Start Date End Date Taking? Authorizing Provider  amLODipine (NORVASC) 10 MG tablet Take 1 tablet (10 mg total) by mouth daily. 04/26/19   Cirigliano, Garvin Fila, DO  aspirin EC 81 MG tablet Take 81 mg by mouth daily.    [provider]  atorvastatin (LIPITOR) 40 MG tablet Take 40 mg by mouth daily. 06/18/22   [provider]  Brinzolamide-Brimonidine (SIMBRINZA) 1-0.2 % SUSP Place 1 drop into both eyes daily.    [provider]  calcitRIOL (ROCALTROL) 0.5 MCG capsule Take 0.5 mcg by mouth daily. 07/06/22   [provider]  carvedilol (COREG) 3.125 MG tablet Take 3.125 mg by mouth 2 (two) times daily with a meal.    [provider]  cefdinir (OMNICEF) 300 MG capsule Take 1 capsule (300 mg total) by mouth after each hemodialysis session on Tuesday, Thursday and Saturday. 10/12/22   Mariel Aloe, MD  FOLIC ACID PO Take 1 tablet by mouth daily.    [provider]  magnesium oxide (MAG-OX) 400 MG tablet Take 400 mg by mouth 2 (two) times daily. 03/11/16   [provider]  Multiple Vitamin (MULTIVITAMIN) tablet Take 1 tablet by mouth daily.    [provider]  Pyridoxine HCl (VITAMIN B-6 PO) Take 1 tablet by mouth daily.    [provider]  sitaGLIPtin (JANUVIA) 25 MG tablet Take 25 mg by mouth daily.    [provider]  timolol (TIMOPTIC) 0.25 % ophthalmic solution Place 1 drop into the right eye daily. 01/17/20   [provider]  torsemide (DEMADEX) 20 MG tablet Take 20 mg by mouth daily. 08/25/21   [provider]  valACYclovir (VALTREX) 1000 MG  tablet Take 1 tablet (1,000 mg total) by mouth daily. 04/26/19   Cirigliano, Garvin Fila, DO  Vitamin D, Ergocalciferol, (DRISDOL) 1.25 MG (50000 UNIT) CAPS capsule Take 50,000 Units by mouth once a week. 07/02/21   [provider]  VYZULTA 0.024 % SOLN Apply 1 drop to eye at bedtime. 12/31/19   [provider]      Allergies    Ace inhibitors, Combivent respimat [ipratropium-albuterol], Latex, Zestril [lisinopril], Neurontin [gabapentin], and Zanaflex [tizanidine]    Review of Systems   Review of Systems  Physical Exam Updated Vital Signs BP (!) 140/85 (BP Location: Right Arm)   Pulse 94   Temp 98.3 F (36.8 C) (Oral)   Resp 20   SpO2 98%  Physical Exam  ED Results / Procedures / Treatments   Labs (all labs ordered are listed, but only abnormal results are displayed) Labs Reviewed - No data to display  EKG None  Radiology No results found.  Procedures Procedures  {Document cardiac monitor, telemetry assessment procedure when appropriate:1}  Medications Ordered in ED Medications - No data to display  ED Course/ Medical Decision Making/ A&P                           Medical Decision Making  ***  {Document critical care time when appropriate:1} {Document review of labs and clinical decision tools ie heart score,  Chads2Vasc2 etc:1}  {Document your independent review of radiology images, and any outside records:1} {Document your discussion with family members, caretakers, and with consultants:1} {Document social determinants of health affecting pt's care:1} {Document your decision making why or why not admission, treatments were needed:1} Final Clinical Impression(s) / ED Diagnoses Final diagnoses:  None    Rx / DC Orders ED Discharge Orders     None

## 2022-10-20 NOTE — ED Provider Notes (Signed)
  Physical Exam  BP (!) 145/110   Pulse 91   Temp 98.3 F (36.8 C) (Oral)   Resp (!) 23   SpO2 100%   Physical Exam Constitutional: Alert and oriented. Well appearing and in no distress. Cardiovascular: S1, S2,  RRR Respiratory: O2 sat 100 on RA Musculoskeletal: Normal range of motion in all extremities. Psychiatric: Mood and affect are normal. Speech and behavior are normal.  Procedures  Procedures  ED Course / MDM   Clinical Course as of 10/20/22 1140  Mon Oct 23, 667  760 56 year old male with history of ESRD on HD last HD this past Saturday and history of paroxysmal SVT brought in by EMS for SVT.  Converted after 6 mg adenosine followed by 12 mg adenosine.  He was recently admitted to the hospital for multifocal pneumonia.  Follow-up on labs, EKG and on chest x-ray [VB]  1729 Reassessed patient, he has no complaints.  No further episodes of SVT and EKG obtained here normal sinus rhythm with no acute ST/T changes concerning for acute ischemia.  Chest x-ray looks improved proved from prior.  He is not hypoxic and satting 100% on room air.  Potassium 3.7.  White blood cell count 14.1 and downtrending from prior in the setting of recent pneumonia.  Hemoglobin 9.0 at baseline.  Magnesium 1.7.  Advise close follow-up with PCP and cardiology and strict return precautions.  He is in agreement with plan and safe for discharge. [VB]    Clinical Course User Index [VB] Elgie Congo, MD   Medical Decision Making Amount and/or Complexity of Data Reviewed Labs: ordered. Radiology: ordered.  Risk Prescription drug management.         Elgie Congo, MD 10/20/22 1141

## 2023-01-11 ENCOUNTER — Emergency Department (HOSPITAL_BASED_OUTPATIENT_CLINIC_OR_DEPARTMENT_OTHER): Payer: Medicare HMO

## 2023-01-11 ENCOUNTER — Other Ambulatory Visit: Payer: Self-pay

## 2023-01-11 ENCOUNTER — Emergency Department (HOSPITAL_BASED_OUTPATIENT_CLINIC_OR_DEPARTMENT_OTHER)
Admission: EM | Admit: 2023-01-11 | Discharge: 2023-01-11 | Disposition: A | Discharge status: Home / Self Care | Payer: Medicare HMO | Attending: Emergency Medicine | Admitting: Emergency Medicine

## 2023-01-11 DIAGNOSIS — Z91040 Latex allergy status: Secondary | ICD-10-CM | POA: Insufficient documentation

## 2023-01-11 DIAGNOSIS — R112 Nausea with vomiting, unspecified: Secondary | ICD-10-CM | POA: Insufficient documentation

## 2023-01-11 DIAGNOSIS — Z7982 Long term (current) use of aspirin: Secondary | ICD-10-CM | POA: Diagnosis not present

## 2023-01-11 DIAGNOSIS — J069 Acute upper respiratory infection, unspecified: Secondary | ICD-10-CM | POA: Insufficient documentation

## 2023-01-11 DIAGNOSIS — I4891 Unspecified atrial fibrillation: Secondary | ICD-10-CM | POA: Insufficient documentation

## 2023-01-11 DIAGNOSIS — N186 End stage renal disease: Secondary | ICD-10-CM | POA: Insufficient documentation

## 2023-01-11 DIAGNOSIS — Z7984 Long term (current) use of oral hypoglycemic drugs: Secondary | ICD-10-CM | POA: Diagnosis not present

## 2023-01-11 DIAGNOSIS — R0602 Shortness of breath: Secondary | ICD-10-CM | POA: Diagnosis present

## 2023-01-11 DIAGNOSIS — I120 Hypertensive chronic kidney disease with stage 5 chronic kidney disease or end stage renal disease: Secondary | ICD-10-CM | POA: Diagnosis not present

## 2023-01-11 DIAGNOSIS — Z79899 Other long term (current) drug therapy: Secondary | ICD-10-CM | POA: Diagnosis not present

## 2023-01-11 DIAGNOSIS — E1122 Type 2 diabetes mellitus with diabetic chronic kidney disease: Secondary | ICD-10-CM | POA: Insufficient documentation

## 2023-01-11 DIAGNOSIS — Z992 Dependence on renal dialysis: Secondary | ICD-10-CM | POA: Diagnosis not present

## 2023-01-11 DIAGNOSIS — Z1152 Encounter for screening for COVID-19: Secondary | ICD-10-CM | POA: Diagnosis not present

## 2023-01-11 DIAGNOSIS — J4541 Moderate persistent asthma with (acute) exacerbation: Secondary | ICD-10-CM | POA: Diagnosis not present

## 2023-01-11 LAB — CBC WITH DIFFERENTIAL/PLATELET
Abs Immature Granulocytes: 0.03 10*3/uL (ref 0.00–0.07)
Basophils Absolute: 0.1 10*3/uL (ref 0.0–0.1)
Basophils Relative: 1 %
Eosinophils Absolute: 0.6 10*3/uL — ABNORMAL HIGH (ref 0.0–0.5)
Eosinophils Relative: 8 %
HCT: 32.3 % — ABNORMAL LOW (ref 39.0–52.0)
Hemoglobin: 10.2 g/dL — ABNORMAL LOW (ref 13.0–17.0)
Immature Granulocytes: 0 %
Lymphocytes Relative: 17 %
Lymphs Abs: 1.4 10*3/uL (ref 0.7–4.0)
MCH: 27.6 pg (ref 26.0–34.0)
MCHC: 31.6 g/dL (ref 30.0–36.0)
MCV: 87.3 fL (ref 80.0–100.0)
Monocytes Absolute: 0.8 10*3/uL (ref 0.1–1.0)
Monocytes Relative: 9 %
Neutro Abs: 5.5 10*3/uL (ref 1.7–7.7)
Neutrophils Relative %: 65 %
Platelets: 275 10*3/uL (ref 150–400)
RBC: 3.7 MIL/uL — ABNORMAL LOW (ref 4.22–5.81)
RDW: 22.6 % — ABNORMAL HIGH (ref 11.5–15.5)
Smear Review: NORMAL
WBC: 8.4 10*3/uL (ref 4.0–10.5)
nRBC: 0 % (ref 0.0–0.2)

## 2023-01-11 LAB — COMPREHENSIVE METABOLIC PANEL
ALT: 11 U/L (ref 0–44)
AST: 15 U/L (ref 15–41)
Albumin: 3.6 g/dL (ref 3.5–5.0)
Alkaline Phosphatase: 81 U/L (ref 38–126)
Anion gap: 6 (ref 5–15)
BUN: 54 mg/dL — ABNORMAL HIGH (ref 6–20)
CO2: 18 mmol/L — ABNORMAL LOW (ref 22–32)
Calcium: 9.2 mg/dL (ref 8.9–10.3)
Chloride: 115 mmol/L — ABNORMAL HIGH (ref 98–111)
Creatinine, Ser: 5.46 mg/dL — ABNORMAL HIGH (ref 0.61–1.24)
GFR, Estimated: 11 mL/min — ABNORMAL LOW (ref >60)
Glucose, Bld: 101 mg/dL — ABNORMAL HIGH (ref 70–99)
Potassium: 5.1 mmol/L (ref 3.5–5.1)
Sodium: 139 mmol/L (ref 135–145)
Total Bilirubin: 0.5 mg/dL (ref 0.3–1.2)
Total Protein: 6.6 g/dL (ref 6.5–8.1)

## 2023-01-11 LAB — RESP PANEL BY RT-PCR (RSV, FLU A&B, COVID)  RVPGX2
Influenza A by PCR: NEGATIVE
Influenza B by PCR: NEGATIVE
Resp Syncytial Virus by PCR: NEGATIVE
SARS Coronavirus 2 by RT PCR: NEGATIVE

## 2023-01-11 LAB — LIPASE, BLOOD: Lipase: 57 U/L — ABNORMAL HIGH (ref 11–51)

## 2023-01-11 MED ORDER — METHYLPREDNISOLONE SODIUM SUCC 125 MG IJ SOLR
125.0000 mg | Freq: Once | INTRAMUSCULAR | Status: AC
  Administered 2023-01-11: 125 mg via INTRAVENOUS
  Filled 2023-01-11: qty 2

## 2023-01-11 MED ORDER — ALBUTEROL SULFATE (2.5 MG/3ML) 0.083% IN NEBU
5.0000 mg | INHALATION_SOLUTION | Freq: Once | RESPIRATORY_TRACT | Status: AC
  Administered 2023-01-11: 5 mg via RESPIRATORY_TRACT
  Filled 2023-01-11: qty 6

## 2023-01-11 MED ORDER — ONDANSETRON HCL 4 MG PO TABS: 4.0000 mg | ORAL_TABLET | Freq: Four times a day (QID) | ORAL | 0 refills | Status: DC

## 2023-01-11 MED ORDER — ONDANSETRON HCL 4 MG/2ML IJ SOLN
4.0000 mg | Freq: Once | INTRAMUSCULAR | Status: AC
  Administered 2023-01-11: 4 mg via INTRAVENOUS
  Filled 2023-01-11: qty 2

## 2023-01-11 MED ORDER — PREDNISONE 20 MG PO TABS: ORAL_TABLET | ORAL | 0 refills | Status: DC

## 2023-01-11 NOTE — ED Triage Notes (Signed)
Patient presents to ED via POV from home. Here with nausea. Denies abdominal pain. Dialysis patient but has missed 2 weeks of treatment.

## 2023-01-11 NOTE — ED Provider Notes (Signed)
[facility]ERGENCY DEPARTMENT Provider Note   CSN: [id number] Arrival date & time: 01/11/23  1221     History  Chief Complaint  Patient presents with   Nausea    Gary Buchanan is a 57 y.o. male.  Patient is a 57 year old male with a history of hypertension, diabetes, A-fib, end-stage renal disease on dialysis who presents with nausea vomiting and shortness of breath.  He states over the last couple days he has had some shortness of breath and increased wheezing.  He is does use nebulizer treatments at home.  He has had a cough which is mostly nonproductive.  Has had some nasal congestion.  He is also had some nausea and dry heaves.  No abdominal pain.  No changes in stools.  He is a dialysis patient and has not been to dialysis in 2 weeks because he says one of the nurses there made him mad.  He started dialysis last summer per his report.  He usually goes Tuesday Thursday Saturday.       Home Medications Prior to Admission medications   Medication Sig Start Date End Date Taking? Authorizing Provider  ondansetron (ZOFRAN) 4 MG tablet Take 1 tablet (4 mg total) by mouth every 6 (six) hours. 01/11/23  Yes Malvin Johns, MD  predniSONE (DELTASONE) 20 MG tablet 2 tabs po daily x 4 days 01/11/23  Yes Malvin Johns, MD  amLODipine (NORVASC) 10 MG tablet Take 1 tablet (10 mg total) by mouth daily. 04/26/19   Cirigliano, Garvin Fila, DO  aspirin EC 81 MG tablet Take 81 mg by mouth daily.    [provider]  atorvastatin (LIPITOR) 40 MG tablet Take 40 mg by mouth daily. 06/18/22   [provider]  Brinzolamide-Brimonidine (SIMBRINZA) 1-0.2 % SUSP Place 1 drop into both eyes daily.    [provider]  carvedilol (COREG) 3.125 MG tablet Take 1 tablet (3.125 mg total) by mouth 2 (two) times daily with a meal. 10/19/22 12/18/22  Elgie Congo, MD  cefdinir (OMNICEF) 300 MG capsule Take 1 capsule (300 mg total) by mouth after each hemodialysis session on  Tuesday, Thursday and Saturday. 10/12/22   Mariel Aloe, MD  FOLIC ACID PO Take 1 tablet by mouth daily.    [provider]  magnesium oxide (MAG-OX) 400 MG tablet Take 400 mg by mouth 2 (two) times daily. 03/11/16   [provider]  Multiple Vitamin (MULTIVITAMIN) tablet Take 1 tablet by mouth daily.    [provider]  Pyridoxine HCl (VITAMIN B-6 PO) Take 1 tablet by mouth daily.    [provider]  sitaGLIPtin (JANUVIA) 25 MG tablet Take 25 mg by mouth daily.    [provider]  timolol (TIMOPTIC) 0.25 % ophthalmic solution Place 1 drop into the right eye daily. 01/17/20   [provider]  torsemide (DEMADEX) 20 MG tablet Take 20 mg by mouth daily. 08/25/21   [provider]  valACYclovir (VALTREX) 1000 MG tablet Take 1 tablet (1,000 mg total) by mouth daily. 04/26/19   Cirigliano, Garvin Fila, DO  Vitamin D, Ergocalciferol, (DRISDOL) 1.25 MG (50000 UNIT) CAPS capsule Take 50,000 Units by mouth once a week. 07/02/21   [provider]  VYZULTA 0.024 % SOLN Apply 1 drop to eye at bedtime. 12/31/19   [provider]      Allergies    Ace inhibitors, Combivent respimat [ipratropium-albuterol], Latex, Zestril [lisinopril], Neurontin [gabapentin], and Zanaflex [tizanidine]    Review of Systems  Review of Systems  Constitutional:  Negative for chills, diaphoresis, fatigue and fever.  HENT:  Positive for congestion and rhinorrhea. Negative for sneezing.   Eyes: Negative.   Respiratory:  Positive for cough, shortness of breath and wheezing. Negative for chest tightness.   Cardiovascular:  Negative for chest pain and leg swelling.  Gastrointestinal:  Positive for nausea and vomiting. Negative for abdominal pain, blood in stool and diarrhea.  Genitourinary:  Negative for difficulty urinating, flank pain, frequency and hematuria.  Musculoskeletal:  Negative for arthralgias and back pain.  Skin:  Negative for rash.   Neurological:  Negative for dizziness, speech difficulty, weakness, numbness and headaches.    Physical Exam Updated Vital Signs BP 138/86   Pulse 80   Temp 98.3 F (36.8 C) (Oral)   Resp 19   Wt 96.2 kg   SpO2 94%   BMI 37.55 kg/m  Physical Exam Constitutional:      Appearance: He is well-developed.  HENT:     Head: Normocephalic and atraumatic.  Eyes:     Pupils: Pupils are equal, round, and reactive to light.  Cardiovascular:     Rate and Rhythm: Normal rate and regular rhythm.     Heart sounds: Normal heart sounds.  Pulmonary:     Effort: Pulmonary effort is normal. Tachypnea present. No respiratory distress.     Breath sounds: Wheezing present. No rales.  Chest:     Chest wall: No tenderness.  Abdominal:     General: Bowel sounds are normal.     Palpations: Abdomen is soft.     Tenderness: There is no abdominal tenderness. There is no guarding or rebound.  Musculoskeletal:        General: Normal range of motion.     Cervical back: Normal range of motion and neck supple.     Comments: 2+ pitting edema to lower extremities bilaterally  Lymphadenopathy:     Cervical: No cervical adenopathy.  Skin:    General: Skin is warm and dry.     Findings: No rash.  Neurological:     Mental Status: He is alert and oriented to person, place, and time.     ED Results / Procedures / Treatments   Labs (all labs ordered are listed, but only abnormal results are displayed) Labs Reviewed  CBC WITH DIFFERENTIAL/PLATELET - Abnormal; Notable for the following components:      Result Value   RBC 3.70 (*)    Hemoglobin 10.2 (*)    HCT 32.3 (*)    RDW 22.6 (*)    Eosinophils Absolute 0.6 (*)    All other components within normal limits  COMPREHENSIVE METABOLIC PANEL - Abnormal; Notable for the following components:   Chloride 115 (*)    CO2 18 (*)    Glucose, Bld 101 (*)    BUN 54 (*)    Creatinine, Ser 5.46 (*)    GFR, Estimated 11 (*)    All other components within  normal limits  LIPASE, BLOOD - Abnormal; Notable for the following components:   Lipase 57 (*)    All other components within normal limits  RESP PANEL BY RT-PCR (RSV, FLU A&B, COVID)  RVPGX2    EKG EKG Interpretation  Date/Time:  Monday January 11 2023 12:45:05 EST Ventricular Rate:  80 PR Interval:  228 QRS Duration: 90 QT Interval:  355 QTC Calculation: 410 R Axis:   68 Text Interpretation: Sinus rhythm Prolonged PR interval Low voltage, precordial leads Probable anteroseptal infarct, old Confirmed  by Malvin Johns [phone]) on 01/11/2023 2:09:50 PM  Radiology DG Chest 2 View  Result Date: 01/11/2023 CLINICAL DATA:  Nausea. Has not been to dialysis in 2wks. Sob. Recd breathing tx in er today. EXAM: CHEST - 2 VIEW COMPARISON:  11/10/2022 FINDINGS: Lungs are clear. Stable tunneled left IJ hemodialysis catheter to the distal SVC. Stable mild cardiomegaly. No effusion. Visualized bones unremarkable. IMPRESSION: Mild cardiomegaly. No acute findings. Electronically Signed   By: Lucrezia Europe M.D.   On: 01/11/2023 13:41    Procedures Procedures    Medications Ordered in ED Medications  albuterol (PROVENTIL) (2.5 MG/3ML) 0.083% nebulizer solution 5 mg (5 mg Nebulization Given 01/11/23 1316)  ondansetron (ZOFRAN) injection 4 mg (4 mg Intravenous Given 01/11/23 1419)  methylPREDNISolone sodium succinate (SOLU-MEDROL) 125 mg/2 mL injection 125 mg (125 mg Intravenous Given 01/11/23 1435)  albuterol (PROVENTIL) (2.5 MG/3ML) 0.083% nebulizer solution 5 mg (5 mg Nebulization Given 01/11/23 1446)    ED Course/ Medical Decision Making/ A&P                             Medical Decision Making Amount and/or Complexity of Data Reviewed Labs: ordered. Radiology: ordered.  Risk Prescription drug management.   Patient is a 57 year old who presents with URI symptoms associated with some wheezing and shortness of breath.  Is also had some nausea and vomiting.  His lungs initially sounded very wheezy  and tight.  He was given nebulizer treatments and feels much better after this.  He currently has no increased work of breathing.  No accessory muscle use.  He is talking in full sentences.  No hypoxia.  Chest x-ray two-view was done and shows no evidence of pneumonia.  No pulmonary edema.  This was interpreted by me and confirmed by the radiologist.  He has no associated chest pain or signs of ACS.  No ischemic changes on EKG.  Even despite missing dialysis, his labs look okay.  His potassium is normal.  His creatinine is elevated as expected.  His lipase is mildly elevated but he has no associate abdominal pain or symptoms that would be more consistent with acute pancreatitis.  He overall feels much better after treatment in the ED.  Will try a p.o. challenge.  If this goes well, he will be discharged home.  He overall is well-appearing.  His COVID/flu was negative.  He likely has a viral syndrome and associated with asthma exacerbation.  Will be started on a prednisone burst.  He was given dose of Solu-Medrol here in the ED.  Was also given a prescription for Zofran.  Was encouraged to have close follow-up with his PCP.  He is going to dialysis tomorrow.  Return precautions were given.  Final Clinical Impression(s) / ED Diagnoses Final diagnoses:  Nausea and vomiting, unspecified vomiting type  Viral URI  Moderate persistent asthma with exacerbation    Rx / DC Orders ED Discharge Orders          Ordered    ondansetron (ZOFRAN) 4 MG tablet  Every 6 hours        01/11/23 1441    predniSONE (DELTASONE) 20 MG tablet        01/11/23 1441              Malvin Johns, MD 01/11/23 1450

## 2023-01-11 NOTE — ED Notes (Signed)
Pt reports he has not been to dialysis x2 weeks d/t being upset w/ the staff at the facility.  He has missed 14 sessions.  C/o nausea x "a couple days" and SOB starting this morning.  Denies abdominal pain.  Pt reports his apartment [place] near a trash shoot and they have been putting a chemical in the shoot and it's causing his asthma to flare.      Pt reports he has met w/ management at the dialysis center and he is agreeable to go to his session tomorrow.

## 2023-01-11 NOTE — ED Notes (Signed)
Dark green, blue, and yellow drawn and sent to lab.

## 2023-01-11 NOTE — Discharge Instructions (Addendum)
Make sure that you go to dialysis tomorrow.  Make an appointment to follow-up with your primary care doctor for recheck on your asthma.  Return to the emergency room if you have any worsening symptoms.

## 2023-01-19 ENCOUNTER — Ambulatory Visit (INDEPENDENT_AMBULATORY_CARE_PROVIDER_SITE_OTHER): Payer: Medicare HMO | Admitting: Podiatry

## 2023-01-19 DIAGNOSIS — Z91199 Patient's noncompliance with other medical treatment and regimen due to unspecified reason: Secondary | ICD-10-CM

## 2023-01-20 NOTE — Progress Notes (Signed)
1. No-show for appointment

## 2023-02-23 ENCOUNTER — Emergency Department (HOSPITAL_BASED_OUTPATIENT_CLINIC_OR_DEPARTMENT_OTHER): Payer: Medicare HMO

## 2023-02-23 ENCOUNTER — Other Ambulatory Visit: Payer: Self-pay

## 2023-02-23 ENCOUNTER — Emergency Department (HOSPITAL_BASED_OUTPATIENT_CLINIC_OR_DEPARTMENT_OTHER)
Admission: EM | Admit: 2023-02-23 | Discharge: 2023-02-23 | Disposition: A | Discharge status: Home / Self Care | Payer: Medicare HMO | Attending: Emergency Medicine | Admitting: Emergency Medicine

## 2023-02-23 DIAGNOSIS — D72829 Elevated white blood cell count, unspecified: Secondary | ICD-10-CM | POA: Diagnosis not present

## 2023-02-23 DIAGNOSIS — D649 Anemia, unspecified: Secondary | ICD-10-CM | POA: Insufficient documentation

## 2023-02-23 DIAGNOSIS — J45901 Unspecified asthma with (acute) exacerbation: Secondary | ICD-10-CM | POA: Insufficient documentation

## 2023-02-23 DIAGNOSIS — Z7984 Long term (current) use of oral hypoglycemic drugs: Secondary | ICD-10-CM | POA: Diagnosis not present

## 2023-02-23 DIAGNOSIS — E1122 Type 2 diabetes mellitus with diabetic chronic kidney disease: Secondary | ICD-10-CM | POA: Diagnosis not present

## 2023-02-23 DIAGNOSIS — Z992 Dependence on renal dialysis: Secondary | ICD-10-CM | POA: Insufficient documentation

## 2023-02-23 DIAGNOSIS — Z91040 Latex allergy status: Secondary | ICD-10-CM | POA: Diagnosis not present

## 2023-02-23 DIAGNOSIS — Z7982 Long term (current) use of aspirin: Secondary | ICD-10-CM | POA: Diagnosis not present

## 2023-02-23 DIAGNOSIS — N186 End stage renal disease: Secondary | ICD-10-CM | POA: Insufficient documentation

## 2023-02-23 DIAGNOSIS — I509 Heart failure, unspecified: Secondary | ICD-10-CM | POA: Diagnosis not present

## 2023-02-23 DIAGNOSIS — Z20822 Contact with and (suspected) exposure to covid-19: Secondary | ICD-10-CM | POA: Diagnosis not present

## 2023-02-23 DIAGNOSIS — I132 Hypertensive heart and chronic kidney disease with heart failure and with stage 5 chronic kidney disease, or end stage renal disease: Secondary | ICD-10-CM | POA: Insufficient documentation

## 2023-02-23 DIAGNOSIS — Z79899 Other long term (current) drug therapy: Secondary | ICD-10-CM | POA: Insufficient documentation

## 2023-02-23 DIAGNOSIS — R0602 Shortness of breath: Secondary | ICD-10-CM | POA: Diagnosis present

## 2023-02-23 LAB — CBC WITH DIFFERENTIAL/PLATELET
Abs Immature Granulocytes: 0.27 10*3/uL — ABNORMAL HIGH (ref 0.00–0.07)
Basophils Absolute: 0.1 10*3/uL (ref 0.0–0.1)
Basophils Relative: 1 %
Eosinophils Absolute: 0.4 10*3/uL (ref 0.0–0.5)
Eosinophils Relative: 4 %
HCT: 38.8 % — ABNORMAL LOW (ref 39.0–52.0)
Hemoglobin: 12.4 g/dL — ABNORMAL LOW (ref 13.0–17.0)
Immature Granulocytes: 3 %
Lymphocytes Relative: 12 %
Lymphs Abs: 1.2 10*3/uL (ref 0.7–4.0)
MCH: 27.7 pg (ref 26.0–34.0)
MCHC: 32 g/dL (ref 30.0–36.0)
MCV: 86.8 fL (ref 80.0–100.0)
Monocytes Absolute: 1.4 10*3/uL — ABNORMAL HIGH (ref 0.1–1.0)
Monocytes Relative: 13 %
Neutro Abs: 7.3 10*3/uL (ref 1.7–7.7)
Neutrophils Relative %: 67 %
Platelets: 243 10*3/uL (ref 150–400)
RBC: 4.47 MIL/uL (ref 4.22–5.81)
RDW: 21.4 % — ABNORMAL HIGH (ref 11.5–15.5)
Smear Review: NORMAL
WBC: 10.6 10*3/uL — ABNORMAL HIGH (ref 4.0–10.5)
nRBC: 0.3 % — ABNORMAL HIGH (ref 0.0–0.2)

## 2023-02-23 LAB — COMPREHENSIVE METABOLIC PANEL
ALT: 14 U/L (ref 0–44)
AST: 18 U/L (ref 15–41)
Albumin: 3.1 g/dL — ABNORMAL LOW (ref 3.5–5.0)
Alkaline Phosphatase: 72 U/L (ref 38–126)
Anion gap: 5 (ref 5–15)
BUN: 57 mg/dL — ABNORMAL HIGH (ref 6–20)
CO2: 17 mmol/L — ABNORMAL LOW (ref 22–32)
Calcium: 8.3 mg/dL — ABNORMAL LOW (ref 8.9–10.3)
Chloride: 113 mmol/L — ABNORMAL HIGH (ref 98–111)
Creatinine, Ser: 6.04 mg/dL — ABNORMAL HIGH (ref 0.61–1.24)
GFR, Estimated: 10 mL/min — ABNORMAL LOW (ref >60)
Glucose, Bld: 86 mg/dL (ref 70–99)
Potassium: 4.4 mmol/L (ref 3.5–5.1)
Sodium: 135 mmol/L (ref 135–145)
Total Bilirubin: 0.5 mg/dL (ref 0.3–1.2)
Total Protein: 6.8 g/dL (ref 6.5–8.1)

## 2023-02-23 LAB — TROPONIN I (HIGH SENSITIVITY)
Troponin I (High Sensitivity): 20 ng/L — ABNORMAL HIGH (ref <18)
Troponin I (High Sensitivity): 18 ng/L — ABNORMAL HIGH (ref <18)

## 2023-02-23 LAB — RESP PANEL BY RT-PCR (RSV, FLU A&B, COVID)  RVPGX2
Influenza A by PCR: NEGATIVE
Influenza B by PCR: NEGATIVE
Resp Syncytial Virus by PCR: NEGATIVE
SARS Coronavirus 2 by RT PCR: NEGATIVE

## 2023-02-23 LAB — BRAIN NATRIURETIC PEPTIDE: B Natriuretic Peptide: 52.2 pg/mL (ref 0.0–100.0)

## 2023-02-23 LAB — CBG MONITORING, ED: Glucose-Capillary: 73 mg/dL (ref 70–99)

## 2023-02-23 MED ORDER — PREDNISONE 20 MG PO TABS: 40.0000 mg | ORAL_TABLET | Freq: Every day | ORAL | 0 refills | Status: DC

## 2023-02-23 MED ORDER — ALBUTEROL SULFATE (2.5 MG/3ML) 0.083% IN NEBU
2.5000 mg | INHALATION_SOLUTION | Freq: Once | RESPIRATORY_TRACT | Status: AC
  Administered 2023-02-23: 2.5 mg via RESPIRATORY_TRACT
  Filled 2023-02-23: qty 3

## 2023-02-23 MED ORDER — PREDNISONE 20 MG PO TABS
40.0000 mg | ORAL_TABLET | Freq: Once | ORAL | Status: AC
  Administered 2023-02-23: 40 mg via ORAL
  Filled 2023-02-23: qty 2

## 2023-02-23 MED ORDER — ALBUTEROL SULFATE (2.5 MG/3ML) 0.083% IN NEBU: 2.5000 mg | INHALATION_SOLUTION | Freq: Four times a day (QID) | RESPIRATORY_TRACT | 12 refills | Status: AC | PRN

## 2023-02-23 NOTE — ED Notes (Signed)
Pt requesting blood sugar check. 73 in triage. 120 ml Apple juice provided

## 2023-02-23 NOTE — Discharge Instructions (Signed)
Note the workup today overall reassuring.  As discussed, symptoms most likely secondary to asthma exacerbation.  Will send in refills for albuterol nebulized solution as well as short course of steroids.  As discussed, given your history of diabetes, steroids increased risk of elevating her blood sugar and causing complications.  Make sure to keep track of your blood sugar at home while taking medication via glucometer.  Please do not hesitate to return to emergency department for worrisome signs and symptoms we discussed become apparent.

## 2023-02-23 NOTE — ED Triage Notes (Addendum)
Presents with complaints of cough, sinus congestion. Pain in abdomen from coughing. Feels like he can't hear well . Symptoms began on Saturday. Pt is on dialysis but reports he has not been but only once weekly and didn't go this week because of illness. Used nebs at 6am this am SOB which worsens with exertion

## 2023-02-23 NOTE — ED Provider Notes (Signed)
[facility] Provider Note   CSN: [id number] Arrival date & time: 02/23/23  1320     History  Chief Complaint  Patient presents with   Cough    Gary Buchanan is a 57 y.o. male.   Cough   57 year old male presents emergency department with complaints of cough, sinus congestion, shortness of breath.  Patient states that symptoms been present since this past Saturday.  Notes worsening of symptoms since onset.  Has been using at home nebulizer which has helped some.  Denies fever, chills.  Reports some right-sided chest pain described as pressure with physical exertion that is relieved with rest. Reports mild pain in epigastric area of abdomen associated with coughing episodes but not otherwise.  Denies nausea, vomiting, urinary symptoms, change in bowel habits.  Patient with history of ESRD on dialysis but states he has been unable to make it to most of his appointments due to difficulties with "insurance covering cost of transportation."  States that he typically gets dialysis Tuesday Thursday Saturday but his most recent dialysis session was on Tuesday of last week.  Past medical history significant for diabetes mellitus, SP TEE, hypertension, ESRD on dialysis, atrial fibrillation, OSA, gout, Constant syndrome, CHF  Home Medications Prior to Admission medications   Medication Sig Start Date End Date Taking? Authorizing Provider  albuterol (PROVENTIL) (2.5 MG/3ML) 0.083% nebulizer solution Take 3 mLs (2.5 mg total) by nebulization every 6 (six) hours as needed for wheezing or shortness of breath. 02/23/23  Yes Wilnette Kales, PA  predniSONE (DELTASONE) 20 MG tablet Take 2 tablets (40 mg total) by mouth daily with breakfast. 02/24/23  Yes Dion Saucier A, PA  amLODipine (NORVASC) 10 MG tablet Take 1 tablet (10 mg total) by mouth daily. 04/26/19   Cirigliano, Garvin Fila, DO  aspirin EC 81 MG tablet Take 81 mg by mouth daily.    [provider]  atorvastatin (LIPITOR) 40 MG tablet Take 40 mg by mouth daily. 06/18/22   [provider]  Brinzolamide-Brimonidine (SIMBRINZA) 1-0.2 % SUSP Place 1 drop into both eyes daily.    [provider]  carvedilol (COREG) 3.125 MG tablet Take 1 tablet (3.125 mg total) by mouth 2 (two) times daily with a meal. 10/19/22 12/18/22  Elgie Congo, MD  cefdinir (OMNICEF) 300 MG capsule Take 1 capsule (300 mg total) by mouth after each hemodialysis session on Tuesday, Thursday and Saturday. 10/12/22   Mariel Aloe, MD  FOLIC ACID PO Take 1 tablet by mouth daily.    [provider]  magnesium oxide (MAG-OX) 400 MG tablet Take 400 mg by mouth 2 (two) times daily. 03/11/16   [provider]  Multiple Vitamin (MULTIVITAMIN) tablet Take 1 tablet by mouth daily.    [provider]  ondansetron (ZOFRAN) 4 MG tablet Take 1 tablet (4 mg total) by mouth every 6 (six) hours. 01/11/23   Malvin Johns, MD  Pyridoxine HCl (VITAMIN B-6 PO) Take 1 tablet by mouth daily.    [provider]  sitaGLIPtin (JANUVIA) 25 MG tablet Take 25 mg by mouth daily.    [provider]  timolol (TIMOPTIC) 0.25 % ophthalmic solution Place 1 drop into the right eye daily. 01/17/20   [provider]  torsemide (DEMADEX) 20 MG tablet Take 20 mg by mouth daily. 08/25/21   [provider]  valACYclovir (VALTREX) 1000 MG tablet Take 1 tablet (1,000 mg total) by mouth daily. 04/26/19   Cirigliano,  Garvin Fila, DO  Vitamin D, Ergocalciferol, (DRISDOL) 1.25 MG (50000 UNIT) CAPS capsule Take 50,000 Units by mouth once a week. 07/02/21   [provider]  VYZULTA 0.024 % SOLN Apply 1 drop to eye at bedtime. 12/31/19   [provider]      Allergies    Ace inhibitors, Combivent respimat [ipratropium-albuterol], Latex, Zestril [lisinopril], Neurontin [gabapentin], and Zanaflex [tizanidine]    Review of Systems   Review of Systems  Respiratory:  Positive for  cough.   All other systems reviewed and are negative.   Physical Exam Updated Vital Signs BP (!) 141/84 (BP Location: Left Arm)   Pulse 87   Temp 98.2 F (36.8 C) (Oral)   Resp (!) 22   Ht '5\' 3"'$  (1.6 m)   Wt 97.5 kg   SpO2 96%   BMI 38.09 kg/m  Physical Exam Vitals and nursing note reviewed.  Constitutional:      General: He is not in acute distress.    Appearance: He is well-developed.  HENT:     Head: Normocephalic and atraumatic.     Nose: Congestion and rhinorrhea present.  Eyes:     Conjunctiva/sclera: Conjunctivae normal.  Cardiovascular:     Rate and Rhythm: Normal rate and regular rhythm.     Heart sounds: No murmur heard. Pulmonary:     Effort: Pulmonary effort is normal. No respiratory distress.     Breath sounds: Wheezing and rhonchi present.     Comments: Diffuse wheeze/rhonchi auscultated in bilateral lung fields.  Patient appreciably tachypneic. Abdominal:     Palpations: Abdomen is soft.     Tenderness: There is no abdominal tenderness.  Musculoskeletal:        General: No swelling.     Cervical back: Neck supple.     Right lower leg: Edema present.     Left lower leg: Edema present.     Comments: 2+ bilateral lower extremity edema.  Skin:    General: Skin is warm and dry.     Capillary Refill: Capillary refill takes less than 2 seconds.  Neurological:     Mental Status: He is alert.  Psychiatric:        Mood and Affect: Mood normal.     ED Results / Procedures / Treatments   Labs (all labs ordered are listed, but only abnormal results are displayed) Labs Reviewed  COMPREHENSIVE METABOLIC PANEL - Abnormal; Notable for the following components:      Result Value   Chloride 113 (*)    CO2 17 (*)    BUN 57 (*)    Creatinine, Ser 6.04 (*)    Calcium 8.3 (*)    Albumin 3.1 (*)    GFR, Estimated 10 (*)    All other components within normal limits  CBC WITH DIFFERENTIAL/PLATELET - Abnormal; Notable for the following components:   WBC 10.6 (*)     Hemoglobin 12.4 (*)    HCT 38.8 (*)    RDW 21.4 (*)    nRBC 0.3 (*)    Monocytes Absolute 1.4 (*)    Abs Immature Granulocytes 0.27 (*)    All other components within normal limits  TROPONIN I (HIGH SENSITIVITY) - Abnormal; Notable for the following components:   Troponin I (High Sensitivity) 20 (*)    All other components within normal limits  TROPONIN I (HIGH SENSITIVITY) - Abnormal; Notable for the following components:   Troponin I (High Sensitivity) 18 (*)    All other components within normal  limits  RESP PANEL BY RT-PCR (RSV, FLU A&B, COVID)  RVPGX2  BRAIN NATRIURETIC PEPTIDE  CBG MONITORING, ED    EKG None  Radiology DG Chest 2 View  Result Date: 02/23/2023 CLINICAL DATA:  Cough, sinus congestion, abdominal pain from coughing, dialysis patient EXAM: CHEST - 2 VIEW COMPARISON:  [id number] FINDINGS: LEFT jugular central venous catheter with tip projecting over cavoatrial junction. Enlargement of cardiac silhouette with pulmonary vascular congestion. Lungs clear. No acute infiltrate, pleural effusion, or pneumothorax. IMPRESSION: Enlargement of cardiac silhouette with pulmonary vascular congestion. No acute abnormalities. Electronically Signed   By: Lavonia Dana M.D.   On: 02/23/2023 14:11    Procedures Procedures    Medications Ordered in ED Medications  albuterol (PROVENTIL) (2.5 MG/3ML) 0.083% nebulizer solution 2.5 mg (2.5 mg Nebulization Given 02/23/23 1413)  albuterol (PROVENTIL) (2.5 MG/3ML) 0.083% nebulizer solution 2.5 mg (2.5 mg Nebulization Given 02/23/23 1518)  predniSONE (DELTASONE) tablet 40 mg (40 mg Oral Given 02/23/23 1548)    ED Course/ Medical Decision Making/ A&P Clinical Course as of 02/23/23 1732  Tue Feb 23, 2023  1428 Last Tuesday dialysis. Once for past 2 weeks due to transportation [CR]  1509 BP(!): 145/92 [CR]    Clinical Course User Index [CR] Wilnette Kales, PA                             Medical Decision Making Amount and/or Complexity  of Data Reviewed Labs: ordered. Radiology: ordered.  Risk Prescription drug management.   This patient presents to the ED for concern of shortness of breath, this involves an extensive number of treatment options, and is a complaint that carries with it a high risk of complications and morbidity.  The differential diagnosis includes The causes for shortness of breath include but are not limited to Cardiac (AHF, pericardial effusion and tamponade, arrhythmias, ischemia, etc) Respiratory (COPD, asthma, pneumonia, pneumothorax, primary pulmonary hypertension, PE/VQ mismatch) Hematological (anemia)  Co morbidities that complicate the patient evaluation  See HPI   Additional history obtained:  Additional history obtained from EMR External records from outside source obtained and reviewed including hospital records   Lab Tests:  I Ordered, and personally interpreted labs.  The pertinent results include: Mild leukocytosis of 10.6.  Evidence of anemia with hemoglobin of 12.4 platelets within normal range.  Decrease in chloride and bicarb of 113 and 17 respectively of which seems to be chronic in nature from prior laboratory studies performed.  Patient at baseline renal dysfunction with creatinine of 6.04, BUN of 57 and GFR of 10.  Being P within normal limits.  Respiratory viral panel negative.  Initial troponin of 20 with repeat 18 -patient has baseline elevation of troponin due to renal dysfunction; EKG concerning for sinus rhythm without acute ischemic changes so doubt ACS   Imaging Studies ordered:  I ordered imaging studies including chest x-ray I independently visualized and interpreted imaging which showed no acute cardiopulmonary abnormalities I agree with the radiologist interpretation   Cardiac Monitoring: / EKG:  The patient was maintained on a cardiac monitor.  I personally viewed and interpreted the cardiac monitored which showed an underlying rhythm of: Sinus rhythm  without acute ischemic changes   Consultations Obtained:  N/a   Problem List / ED Course / Critical interventions / Medication management  Asthma exacerbation I ordered medication including albuterol/prednisone   Reevaluation of the patient after these medicines showed that the patient improved I have reviewed the  patients home medicines and have made adjustments as needed   Social Determinants of Health:  Denies tobacco/illicit drug use   Test / Admission - Considered:  Asthma exacerbation Vitals signs significant for mild hypertension with blood pressure 141/84.  Recommend follow-up with primary care regarding elevation blood pressure. Otherwise within normal range and stable throughout visit. Laboratory/imaging studies significant for: See above Patient with overall reassuring workup.  Doubt ACS given delta negative troponin with no acute ischemic changes on EKG.  Doubt PE.  Doubt CHF exacerbation.  Given clinical exam findings of diffuse wheeze/rhonchi responding to albuterol and prednisone, symptoms most consistent with asthma exacerbation.  Will treat at home with similar medications.  Discussed at length with patient regarding prednisones likelihood of increasing blood sugar but patient has at home glucometer and will check blood sugar regularly and adjust medicine as needed.  Upon further history, patient without albuterol nebulizers at home for a few weeks most likely contributing to current clinical presentation.  Patient recommended follow-up with primary care regarding current asthma exacerbation for reassessment.  Treatment plan discussed at length with patient and he acknowledged understanding was agreeable to said plan. Worrisome signs and symptoms were discussed with the patient, and the patient acknowledged understanding to return to the ED if noticed. Patient was stable upon discharge.          Final Clinical Impression(s) / ED Diagnoses Final diagnoses:   Exacerbation of asthma, unspecified asthma severity, unspecified whether persistent    Rx / DC Orders ED Discharge Orders          Ordered    predniSONE (DELTASONE) 20 MG tablet  Daily with breakfast        02/23/23 1659    albuterol (PROVENTIL) (2.5 MG/3ML) 0.083% nebulizer solution  Every 6 hours PRN        02/23/23 1657              Wilnette Kales, [place] 02/23/23 1732    Cristie Hem, MD 02/24/23 [phone]

## 2023-02-26 IMAGING — CR DG CHEST 2V
2 series · 2 of 2 positions shown · non-contrast
Comparison: Chest x-ray 08/19/2021, CT chest 04/25/2020

CLINICAL DATA: Shortness of breath.  Palpitations.

EXAM:
CHEST - 2 VIEW

[w chest pa]
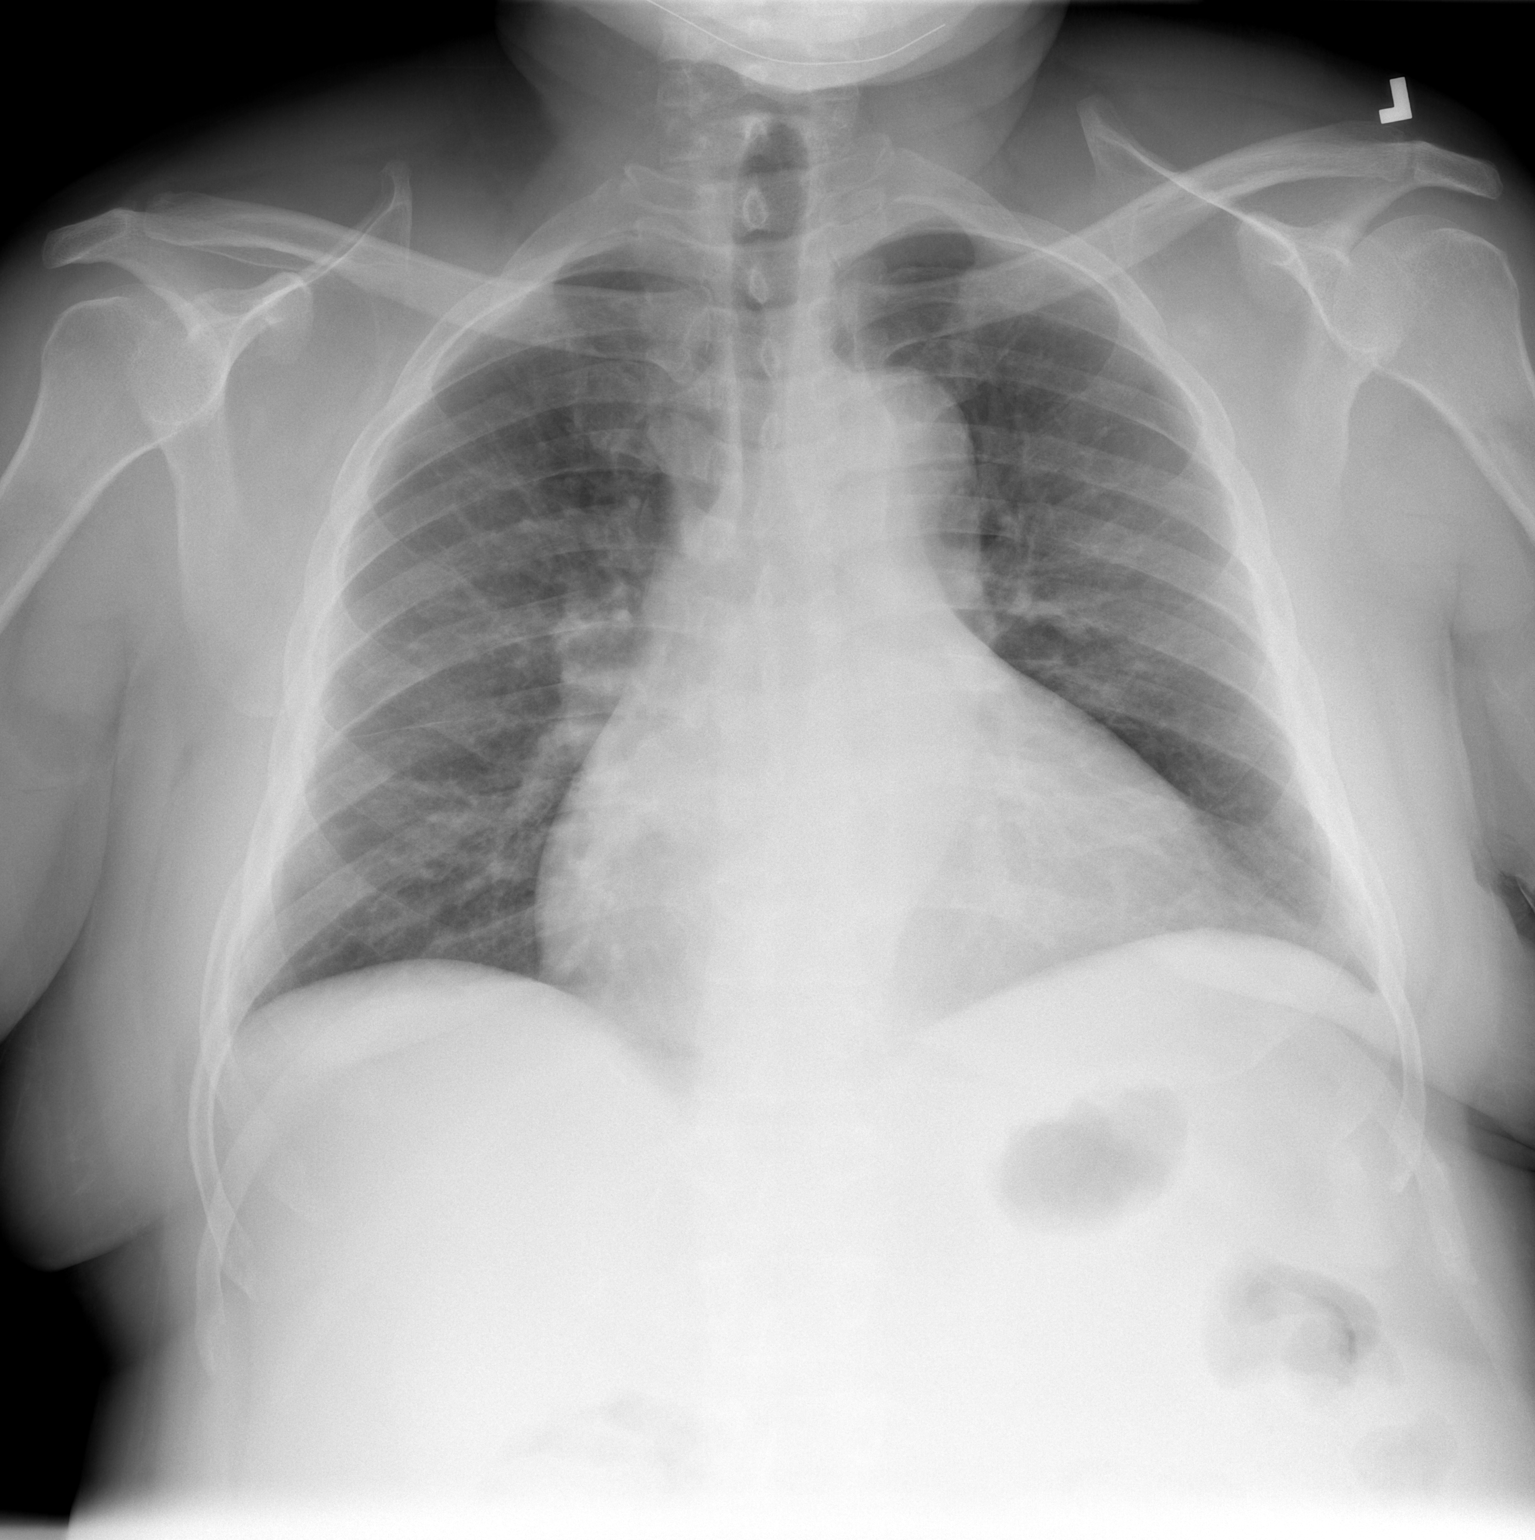

[w chest lat]
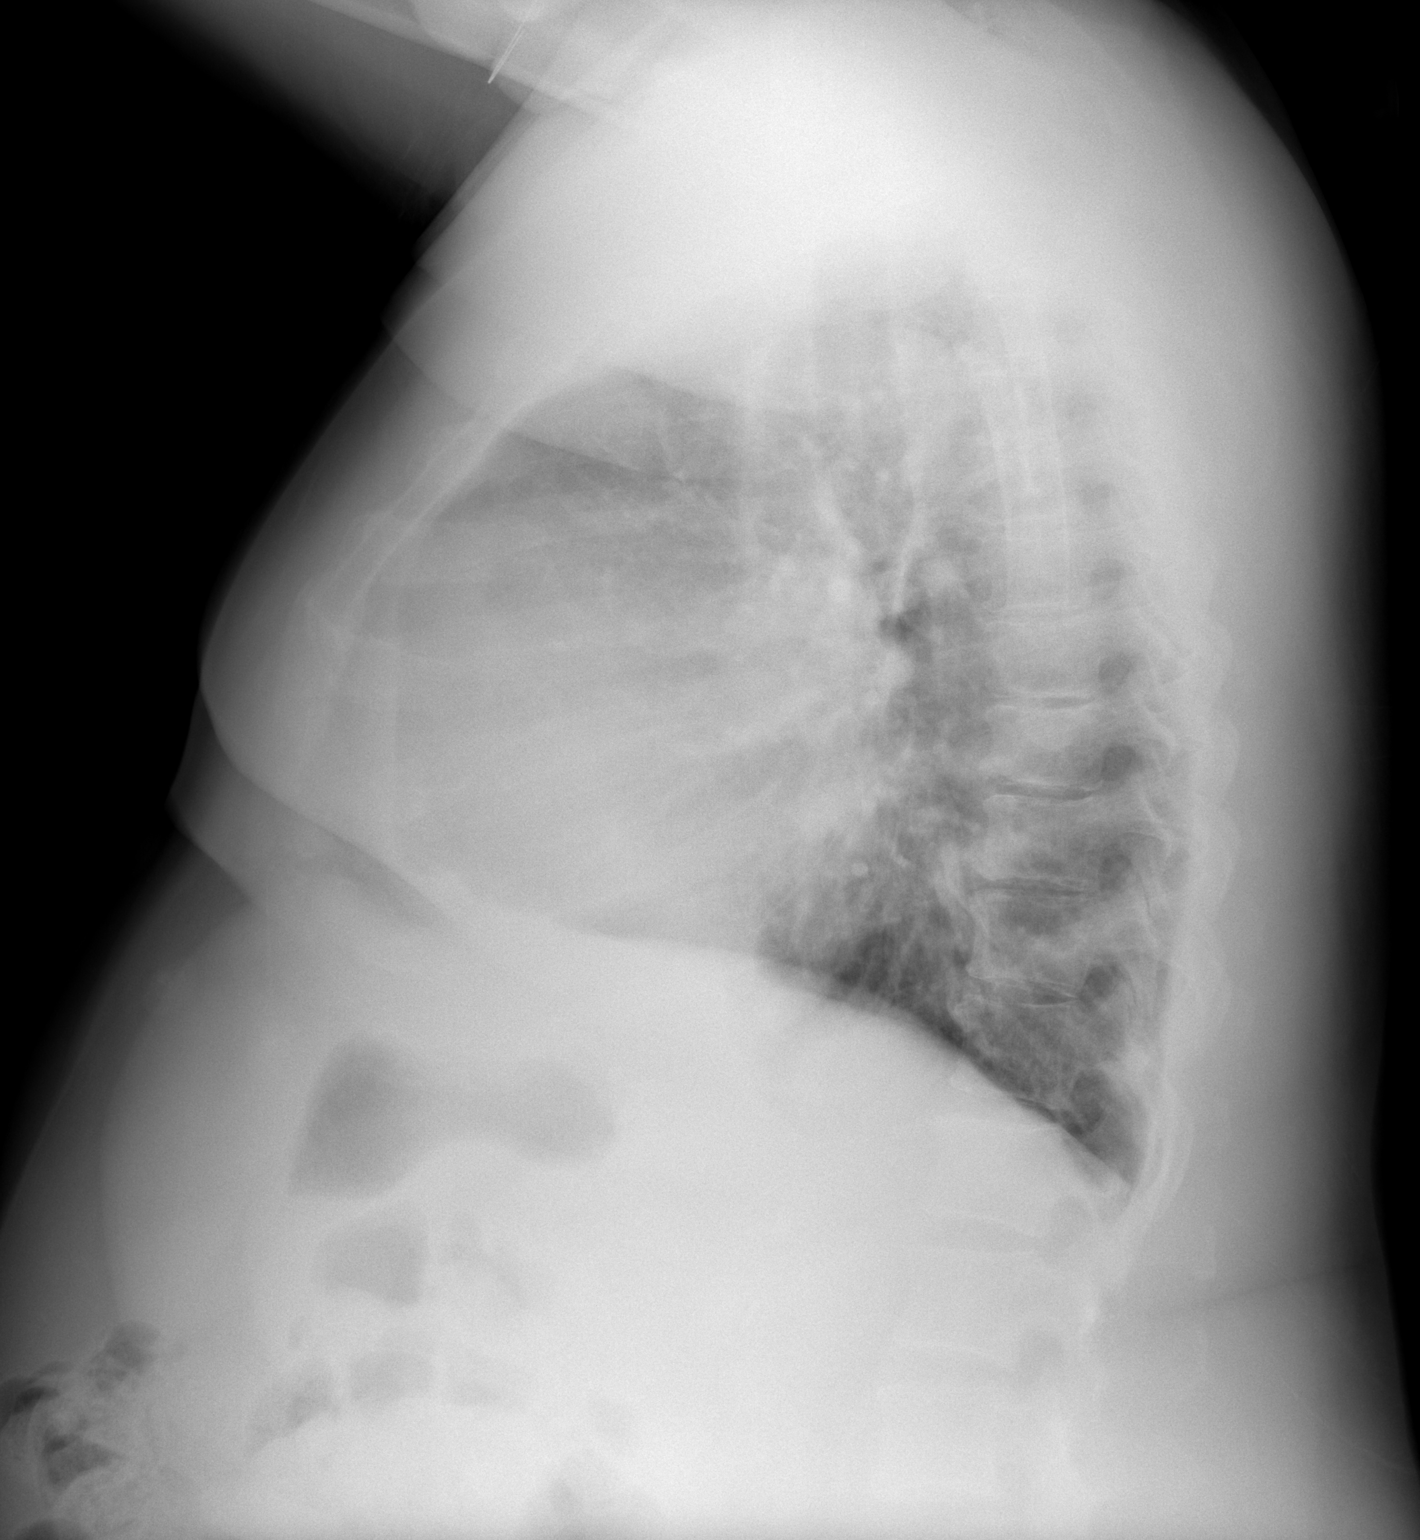

[2 of 2 positions shown; findings below may reference images not displayed]

FINDINGS: Prominent cardiac silhouette. The heart and mediastinal contours are
unchanged.

No focal consolidation. No pulmonary edema. No pleural effusion. No
pneumothorax.

No acute osseous abnormality.
IMPRESSION: No active cardiopulmonary disease.

## 2023-04-16 ENCOUNTER — Encounter: Payer: Self-pay | Admitting: Podiatry

## 2023-04-16 ENCOUNTER — Ambulatory Visit (INDEPENDENT_AMBULATORY_CARE_PROVIDER_SITE_OTHER): Payer: Medicare HMO | Admitting: Podiatry

## 2023-04-16 VITALS — BP 115/75

## 2023-04-16 DIAGNOSIS — E119 Type 2 diabetes mellitus without complications: Secondary | ICD-10-CM

## 2023-04-16 DIAGNOSIS — M79674 Pain in right toe(s): Secondary | ICD-10-CM | POA: Diagnosis not present

## 2023-04-16 DIAGNOSIS — M79675 Pain in left toe(s): Secondary | ICD-10-CM

## 2023-04-16 DIAGNOSIS — N186 End stage renal disease: Secondary | ICD-10-CM

## 2023-04-16 DIAGNOSIS — B351 Tinea unguium: Secondary | ICD-10-CM

## 2023-04-16 DIAGNOSIS — M2141 Flat foot [pes planus] (acquired), right foot: Secondary | ICD-10-CM

## 2023-04-16 DIAGNOSIS — M2142 Flat foot [pes planus] (acquired), left foot: Secondary | ICD-10-CM

## 2023-04-18 NOTE — Progress Notes (Signed)
ANNUAL DIABETIC FOOT EXAM  Subjective: Gary Buchanan presents today for annual diabetic foot examination.  Chief Complaint  Patient presents with   Nail Problem    DFC BS-do not know A1C-do not know PCP-[name] PCP VST 2 or 3 weeks   Patient confirms h/o diabetes.  Patient denies any h/o foot wounds.  Risk factors: diabetes, history of gout, HTN, CHF, CKD, ESRD on hemodialysis.  Susann Givens, PA-C is patient's PCP.  Past Medical History:  Diagnosis Date   A-fib    Asthma    Blindness of left eye    Chronic kidney disease    Diabetes mellitus without complication    Dyspnea    ESRD (end stage renal disease)    Gynecomastia    Hypertension    Left knee pain    SVT (supraventricular tachycardia)    Patient Active Problem List   Diagnosis Date Noted   ESRD (end stage renal disease) 10/14/2022   Multifocal pneumonia 10/12/2022   Acute respiratory failure with hypoxia 10/11/2022   Low vision 11/15/2019   Bradycardia 08/24/2019   Other allergic rhinitis 08/24/2019   Non-compliance 04/26/2019   Acute on chronic respiratory failure with hypoxia and hypercapnia 05/31/2018   Asthma exacerbation 11/09/2017   Chest pain 10/11/2017   CHF (congestive heart failure) 10/11/2017   Bilateral chronic serous otitis media 09/23/2017   SOB (shortness of breath) 07/15/2017   Asthma 06/21/2017   Blindness of left eye 06/21/2017   New onset a-fib 06/21/2017   Type 2 diabetes mellitus with complication, without long-term current use of insulin 06/21/2017   Hypertension 06/21/2017   Obstructive sleep apnea syndrome 06/21/2017   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 07/11/2016   Hypertensive retinopathy 01/28/2016   Nuclear sclerotic cataract 01/28/2016   Presbyopia 01/28/2016   Ptosis of eyelid 01/28/2016   Mood disorder 01/07/2015   Abnormal weight gain 12/11/2014   Hypercalcemia  12/11/2014   Muscle cramping 11/19/2014   Hyperprolactinemia 10/10/2014   Male hypogonadism 10/10/2014   Chronic gout due to renal impairment, left ankle and foot, without tophus (tophi) 10/02/2014   Chronic gout of left foot due to renal impairment 10/02/2014   Chronic gout of right foot due to renal impairment 10/02/2014   Other gouty nephropathy 10/02/2014   Adrenal adenoma 07/23/2014   Gynecomastia 01/09/2014   Arthritis, multiple joint involvement 11/10/2013   Disorder of joint 11/10/2013   Anemia due to chronic kidney disease 01/31/2013   Chronic kidney disease, stage III (moderate) 01/31/2013   Disorder of optic nerve 01/27/2013   Glaucoma suspect 01/27/2013   Nuclear senile cataract 01/27/2013   Optic atrophy 01/27/2013   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   Senile nuclear sclerosis 01/27/2013   Herpes dermatitis 11/30/2012   Herpes simplex complications 11/30/2012   Herpes simplex with complication 11/30/2012   Leg cramps 11/30/2012   Blepharitis 09/15/2012   Impairment level: one eye: total impairment: other eye: not specified 09/15/2012   Myogenic ptosis of eyelid 09/15/2012   Ptosis, myogenic 09/15/2012   Total impairment of one eye 09/15/2012   Visual loss, one eye, no light perception (NLP) 09/15/2012   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   Conn's syndrome 08/19/2012   Primary aldosteronism 08/19/2012   Chronic sinusitis 07/06/2012   Hypokalemia 07/06/2012   Hyperaldosteronism 04/28/2012   Morbid obesity 04/25/2012   Past Surgical History:  Procedure  Laterality Date   ADRENALECTOMY     EYE SURGERY     HERNIA REPAIR     SINOSCOPY     SINUS ENDO WITH FUSION  10/17/2012   Procedure: SINUS ENDO WITH FUSION;  Surgeon: Darletta Moll, MD;  Location: [facility];  Service: ENT;  Laterality: Bilateral;  Bilateral Ethmoidectomy,  Bilateral Maxillary Antrostomy, Bilateral Frontal Recess Exploration,   W/ Fusion Protocol    Current Outpatient Medications on File Prior to Visit  Medication Sig Dispense Refill   albuterol (PROVENTIL) (2.5 MG/3ML) 0.083% nebulizer solution Take 3 mLs (2.5 mg total) by nebulization every 6 (six) hours as needed for wheezing or shortness of breath. 75 mL 12   amLODipine (NORVASC) 10 MG tablet Take 1 tablet (10 mg total) by mouth daily. 90 tablet 3   aspirin EC 81 MG tablet Take 81 mg by mouth daily.     atorvastatin (LIPITOR) 40 MG tablet Take 40 mg by mouth daily.     Brinzolamide-Brimonidine (SIMBRINZA) 1-0.2 % SUSP Place 1 drop into both eyes daily.     carvedilol (COREG) 3.125 MG tablet Take 1 tablet (3.125 mg total) by mouth 2 (two) times daily with a meal. 60 tablet 1   cefdinir (OMNICEF) 300 MG capsule Take 1 capsule (300 mg total) by mouth after each hemodialysis session on Tuesday, Thursday and Saturday. 3 capsule 0   FOLIC ACID PO Take 1 tablet by mouth daily.     magnesium oxide (MAG-OX) 400 MG tablet Take 400 mg by mouth 2 (two) times daily.     Multiple Vitamin (MULTIVITAMIN) tablet Take 1 tablet by mouth daily.     ondansetron (ZOFRAN) 4 MG tablet Take 1 tablet (4 mg total) by mouth every 6 (six) hours. 10 tablet 0   predniSONE (DELTASONE) 20 MG tablet Take 2 tablets (40 mg total) by mouth daily with breakfast. 8 tablet 0   Pyridoxine HCl (VITAMIN B-6 PO) Take 1 tablet by mouth daily.     sitaGLIPtin (JANUVIA) 25 MG tablet Take 25 mg by mouth daily.     timolol (TIMOPTIC) 0.25 % ophthalmic solution Place 1 drop into the right eye daily.     torsemide (DEMADEX) 20 MG tablet Take 20 mg by mouth daily.     valACYclovir (VALTREX) 1000 MG tablet Take 1 tablet (1,000 mg total) by mouth daily. 90 tablet 3   Vitamin D, Ergocalciferol, (DRISDOL) 1.25 MG (50000 UNIT) CAPS capsule Take 50,000 Units by mouth once a week.     VYZULTA 0.024 % SOLN Apply 1 drop to eye at bedtime.     No current facility-administered medications on file prior to visit.    Allergies  Allergen  Reactions   Ace Inhibitors Swelling    Lips swelling   Combivent Respimat [Ipratropium-Albuterol] Palpitations   Latex Itching   Zestril [Lisinopril] Swelling    Lip swelling   Neurontin [Gabapentin] Other (See Comments)    Rapid heartbeat   Zanaflex [Tizanidine] Other (See Comments)    Chest pain   Social History   Occupational History   Not on file  Tobacco Use   Smoking status: Never   Smokeless tobacco: Never  Vaping Use   Vaping Use: Never used  Substance and Sexual Activity   Alcohol use: No   Drug use: No   Sexual activity: Not Currently    Birth control/protection: Abstinence   Family History  Problem Relation Age of Onset   Diabetes Mother    Hypertension Mother  Hyperlipidemia Mother    Diabetes Father    Hypertension Father    Diabetes Sister    Hypertension Sister    Diabetes Brother    Hypertension Brother    Allergic rhinitis Neg Hx    Asthma Neg Hx    Eczema Neg Hx    Urticaria Neg Hx    Immunization History  Administered Date(s) Administered   PFIZER(Purple Top)SARS-COV-2 Vaccination 05/20/2020, 06/17/2020, 12/16/2020   Pneumococcal Polysaccharide-23 12/09/2018   Tdap 12/09/2018     Review of Systems: Negative except as noted in the HPI.   Objective: Vitals:   04/16/23 0936  BP: 115/75    Anquan Azzarello is a pleasant 57 y.o. male in NAD. AAO X 3.  Vascular Examination: CFT <3 seconds b/l LE. Palpable DP pulse(s) b/l LE. Palpable PT pulse(s) b/l LE. Pedal hair sparse. No pain with calf compression b/l. Lower extremity skin temperature gradient within normal limits. Trace edema noted BLE. No cyanosis or clubbing noted b/l LE.  Dermatological Examination: Pedal skin is warm and supple b/l LE. No open wounds b/l LE. No interdigital macerations noted b/l LE. Toenails 1-5 b/l elongated, discolored, dystrophic, thickened, crumbly with subungual debris and tenderness to dorsal palpation. No hyperkeratotic nor porokeratotic lesions present on  today's visit.  Neurological Examination: Protective sensation intact 5/5 intact bilaterally with 10g monofilament b/l. Vibratory sensation intact b/l.  Musculoskeletal Examination: Muscle strength 5/5 to all lower extremity muscle groups bilaterally. Pes planus deformity noted bilateral LE. Patient ambulates independent of any assistive aids.  Footwear Assessment: Does the patient wear appropriate shoes? Yes. Does the patient need inserts/orthotics? No.  Lab Results  Component Value Date   HGBA1C 6.5 05/03/2019   ADA Risk Categorization: Low Risk :  Patient has all of the following: Intact protective sensation No prior foot ulcer  No severe deformity Pedal pulses present  Assessment: 1. Pain due to onychomycosis of toenails of both feet   2. Pes planus of both feet   3. Controlled type 2 diabetes mellitus without complication, without long-term current use of insulin   4. ESRD (end stage renal disease)   5. Encounter for diabetic foot exam     Plan: -Patient was evaluated and treated. All patient's and/or POA's questions/concerns answered on today's visit. -Diabetic foot examination performed today. -Discussed and educated patient on diabetic foot care, especially with  regards to the vascular, neurological and musculoskeletal systems. -Patient to continue soft, supportive shoe gear daily. -Toenails 1-5 b/l were debrided in length and girth with sterile nail nippers and dremel without iatrogenic bleeding.  -Patient/POA to call should there be question/concern in the interim. Return in about 3 months (around 07/16/2023).  Freddie Breech, DPM

## 2023-05-08 ENCOUNTER — Emergency Department (HOSPITAL_COMMUNITY)
Admission: EM | Admit: 2023-05-08 | Discharge: 2023-05-08 | Disposition: A | Discharge status: Home / Self Care | Payer: Medicare HMO | Attending: Emergency Medicine | Admitting: Emergency Medicine

## 2023-05-08 ENCOUNTER — Other Ambulatory Visit: Payer: Self-pay

## 2023-05-08 ENCOUNTER — Emergency Department (HOSPITAL_COMMUNITY): Payer: Medicare HMO

## 2023-05-08 ENCOUNTER — Encounter (HOSPITAL_COMMUNITY): Payer: Self-pay

## 2023-05-08 DIAGNOSIS — N186 End stage renal disease: Secondary | ICD-10-CM | POA: Diagnosis present

## 2023-05-08 DIAGNOSIS — Z992 Dependence on renal dialysis: Secondary | ICD-10-CM | POA: Diagnosis not present

## 2023-05-08 DIAGNOSIS — Z91040 Latex allergy status: Secondary | ICD-10-CM | POA: Diagnosis not present

## 2023-05-08 LAB — CBC WITH DIFFERENTIAL/PLATELET
Abs Immature Granulocytes: 0.09 10*3/uL — ABNORMAL HIGH (ref 0.00–0.07)
Basophils Absolute: 0.1 10*3/uL (ref 0.0–0.1)
Basophils Relative: 1 %
Eosinophils Absolute: 0.5 10*3/uL (ref 0.0–0.5)
Eosinophils Relative: 4 %
HCT: 28.7 % — ABNORMAL LOW (ref 39.0–52.0)
Hemoglobin: 8.9 g/dL — ABNORMAL LOW (ref 13.0–17.0)
Immature Granulocytes: 1 %
Lymphocytes Relative: 10 %
Lymphs Abs: 1.2 10*3/uL (ref 0.7–4.0)
MCH: 29 pg (ref 26.0–34.0)
MCHC: 31 g/dL (ref 30.0–36.0)
MCV: 93.5 fL (ref 80.0–100.0)
Monocytes Absolute: 1 10*3/uL (ref 0.1–1.0)
Monocytes Relative: 8 %
Neutro Abs: 9.7 10*3/uL — ABNORMAL HIGH (ref 1.7–7.7)
Neutrophils Relative %: 76 %
Platelets: 370 10*3/uL (ref 150–400)
RBC: 3.07 MIL/uL — ABNORMAL LOW (ref 4.22–5.81)
RDW: 21.7 % — ABNORMAL HIGH (ref 11.5–15.5)
WBC: 12.5 10*3/uL — ABNORMAL HIGH (ref 4.0–10.5)
nRBC: 0 % (ref 0.0–0.2)

## 2023-05-08 LAB — COMPREHENSIVE METABOLIC PANEL
ALT: 13 U/L (ref 0–44)
AST: 14 U/L — ABNORMAL LOW (ref 15–41)
Albumin: 3 g/dL — ABNORMAL LOW (ref 3.5–5.0)
Alkaline Phosphatase: 61 U/L (ref 38–126)
Anion gap: 10 (ref 5–15)
BUN: 69 mg/dL — ABNORMAL HIGH (ref 6–20)
CO2: 17 mmol/L — ABNORMAL LOW (ref 22–32)
Calcium: 8.8 mg/dL — ABNORMAL LOW (ref 8.9–10.3)
Chloride: 110 mmol/L (ref 98–111)
Creatinine, Ser: 7.79 mg/dL — ABNORMAL HIGH (ref 0.61–1.24)
GFR, Estimated: 7 mL/min — ABNORMAL LOW (ref >60)
Glucose, Bld: 93 mg/dL (ref 70–99)
Potassium: 5.5 mmol/L — ABNORMAL HIGH (ref 3.5–5.1)
Sodium: 137 mmol/L (ref 135–145)
Total Bilirubin: 0.5 mg/dL (ref 0.3–1.2)
Total Protein: 6.5 g/dL (ref 6.5–8.1)

## 2023-05-08 MED ORDER — SODIUM ZIRCONIUM CYCLOSILICATE 10 G PO PACK
10.0000 g | PACK | Freq: Once | ORAL | Status: AC
  Administered 2023-05-08: 10 g via ORAL
  Filled 2023-05-08: qty 1

## 2023-05-08 MED ORDER — SODIUM BICARBONATE 650 MG PO TABS: 650.0000 mg | ORAL_TABLET | Freq: Two times a day (BID) | ORAL | 0 refills | Status: AC

## 2023-05-08 MED ORDER — LOKELMA 10 G PO PACK: 10.0000 g | PACK | Freq: Every day | ORAL | 0 refills | Status: AC

## 2023-05-08 NOTE — ED Notes (Addendum)
While wheeling pt out for discharge, pt reports that security officers came into his room and he gave them $4400 to store while he was in ED but did not have any belongings tags given to him by said security. Security able to find belongings and give to him before discharge.

## 2023-05-08 NOTE — Progress Notes (Signed)
Brief nephrology note  57 year old who previously has been getting dialysis at [facility] presents after not having dialysis for 9 days.  Patient states that there was a miscommunication at his dialysis unit and they asked him not to come back.  We do not know the details of his dismissal.  He states that he has overall felt okay.  Maybe notices some mild volume overload and mild shortness of breath.  Labs overall reassuring with only mild acidosis and mild hyperkalemia.  I discussed with the patient that he has no acute needs for dialysis today.  I recommended that he follow-up with his previous dialysis provider so they can arrange further outpatient dialysis plans for him.  He should return to the hospital if he has further symptoms that would be indicative of needs for acute dialysis.  We discussed with the symptoms would be.  I would recommend discharge from the emergency department with Lokelma 10 g daily for 3 days and sodium bicarbonate 650 mg twice daily 30-day supply.

## 2023-05-08 NOTE — ED Provider Notes (Signed)
[place] EMERGENCY DEPARTMENT AT [facility] Provider Note   CSN: [id number] Arrival date & time: 05/08/23  6213     History  Chief Complaint  Patient presents with   hemodialysis    Gary Buchanan is a 57 y.o. male.  57 yo M with a significant past medical history of ESRD gets dialysis Tuesday Thursday and Saturday has no longer access to his dialysis clinic.  Has not had dialysis he thinks about 9 days.  He denies any specific symptoms.  Feels like it is time to get his dialysis.  Does feel little bit swollen.  Some difficulty breathing.        Home Medications Prior to Admission medications   Medication Sig Start Date End Date Taking? Authorizing Provider  sodium bicarbonate 650 MG tablet Take 1 tablet (650 mg total) by mouth 2 (two) times daily. 05/08/23 06/07/23 Yes Melene Plan, DO  sodium zirconium cyclosilicate (LOKELMA) 10 g PACK packet Take 10 g by mouth daily for 3 doses. 05/08/23 05/11/23 Yes Melene Plan, DO  albuterol (PROVENTIL) (2.5 MG/3ML) 0.083% nebulizer solution Take 3 mLs (2.5 mg total) by nebulization every 6 (six) hours as needed for wheezing or shortness of breath. 02/23/23   Peter Garter, PA  amLODipine (NORVASC) 10 MG tablet Take 1 tablet (10 mg total) by mouth daily. 04/26/19   Cirigliano, Jearld Lesch, DO  aspirin EC 81 MG tablet Take 81 mg by mouth daily.    [provider]  atorvastatin (LIPITOR) 40 MG tablet Take 40 mg by mouth daily. 06/18/22   [provider]  Brinzolamide-Brimonidine (SIMBRINZA) 1-0.2 % SUSP Place 1 drop into both eyes daily.    [provider]  carvedilol (COREG) 3.125 MG tablet Take 1 tablet (3.125 mg total) by mouth 2 (two) times daily with a meal. 10/19/22 12/18/22  Mardene Sayer, MD  cefdinir (OMNICEF) 300 MG capsule Take 1 capsule (300 mg total) by mouth after each hemodialysis session on Tuesday, Thursday and Saturday. 10/12/22   Narda Bonds, MD  FOLIC ACID PO Take 1 tablet by mouth daily.     [provider]  magnesium oxide (MAG-OX) 400 MG tablet Take 400 mg by mouth 2 (two) times daily. 03/11/16   [provider]  Multiple Vitamin (MULTIVITAMIN) tablet Take 1 tablet by mouth daily.    [provider]  ondansetron (ZOFRAN) 4 MG tablet Take 1 tablet (4 mg total) by mouth every 6 (six) hours. 01/11/23   Rolan Bucco, MD  predniSONE (DELTASONE) 20 MG tablet Take 2 tablets (40 mg total) by mouth daily with breakfast. 02/24/23   Sherian Maroon A, PA  Pyridoxine HCl (VITAMIN B-6 PO) Take 1 tablet by mouth daily.    [provider]  sitaGLIPtin (JANUVIA) 25 MG tablet Take 25 mg by mouth daily.    [provider]  timolol (TIMOPTIC) 0.25 % ophthalmic solution Place 1 drop into the right eye daily. 01/17/20   [provider]  torsemide (DEMADEX) 20 MG tablet Take 20 mg by mouth daily. 08/25/21   [provider]  valACYclovir (VALTREX) 1000 MG tablet Take 1 tablet (1,000 mg total) by mouth daily. 04/26/19   Cirigliano, Jearld Lesch, DO  Vitamin D, Ergocalciferol, (DRISDOL) 1.25 MG (50000 UNIT) CAPS capsule Take 50,000 Units by mouth once a week. 07/02/21   [provider]  VYZULTA 0.024 % SOLN Apply 1 drop to eye at bedtime. 12/31/19   [provider]      Allergies  Ace inhibitors, Combivent respimat [ipratropium-albuterol], Latex, Zestril [lisinopril], Neurontin [gabapentin], and Zanaflex [tizanidine]    Review of Systems   Review of Systems  Physical Exam Updated Vital Signs BP 114/77 (BP Location: Right Arm)   Pulse 67   Temp 97.9 F (36.6 C) (Oral)   Resp 14   Ht 5\' 3"  (1.6 m)   Wt 97.5 kg   SpO2 100%   BMI 38.09 kg/m  Physical Exam Vitals and nursing note reviewed.  Constitutional:      Appearance: He is well-developed.  HENT:     Head: Normocephalic and atraumatic.  Eyes:     Pupils: Pupils are equal, round, and reactive to light.  Neck:     Vascular: No JVD.  Cardiovascular:     Rate and  Rhythm: Normal rate and regular rhythm.     Heart sounds: No murmur heard.    No friction rub. No gallop.  Pulmonary:     Effort: No respiratory distress.     Breath sounds: No wheezing.     Comments: Left-sided tunneled dialysis catheter without erythema warmth or tenderness. Abdominal:     General: There is no distension.     Tenderness: There is no abdominal tenderness. There is no guarding or rebound.  Musculoskeletal:        General: Normal range of motion.     Cervical back: Normal range of motion and neck supple.  Skin:    Coloration: Skin is not pale.     Findings: No rash.  Neurological:     Mental Status: He is alert and oriented to person, place, and time.  Psychiatric:        Behavior: Behavior normal.     ED Results / Procedures / Treatments   Labs (all labs ordered are listed, but only abnormal results are displayed) Labs Reviewed  CBC WITH DIFFERENTIAL/PLATELET - Abnormal; Notable for the following components:      Result Value   WBC 12.5 (*)    RBC 3.07 (*)    Hemoglobin 8.9 (*)    HCT 28.7 (*)    RDW 21.7 (*)    Neutro Abs 9.7 (*)    Abs Immature Granulocytes 0.09 (*)    All other components within normal limits  COMPREHENSIVE METABOLIC PANEL - Abnormal; Notable for the following components:   Potassium 5.5 (*)    CO2 17 (*)    BUN 69 (*)    Creatinine, Ser 7.79 (*)    Calcium 8.8 (*)    Albumin 3.0 (*)    AST 14 (*)    GFR, Estimated 7 (*)    All other components within normal limits    EKG EKG Interpretation  Date/Time:  Saturday May 08 2023 10:14:11 EDT Ventricular Rate:  70 PR Interval:  231 QRS Duration: 97 QT Interval:  372 QTC Calculation: 402 R Axis:   68 Text Interpretation: Sinus rhythm Prolonged PR interval Low voltage, precordial leads No significant change since last tracing Confirmed by Melene Plan [phone]) on 05/08/2023 10:50:01 AM  Radiology DG Chest Port 1 View  Result Date: 05/08/2023 CLINICAL DATA:  Shortness of breath  EXAM: PORTABLE CHEST 1 VIEW COMPARISON:  Radiograph 02/23/2023 FINDINGS: Left neck catheter tip overlies the superior cavoatrial junction. Unchanged enlarged cardiac silhouette. There is a new dense left basilar consolidation. No large effusion or evidence of pneumothorax. No acute osseous abnormality. IMPRESSION: New dense left basilar airspace consolidation, compatible with pneumonia or atelectasis. Electronically Signed   By: Caprice Renshaw  M.D.   On: 05/08/2023 10:26    Procedures Procedures    Medications Ordered in ED Medications  sodium zirconium cyclosilicate (LOKELMA) packet 10 g (10 g Oral Given 05/08/23 1055)    ED Course/ Medical Decision Making/ A&P                             Medical Decision Making Amount and/or Complexity of Data Reviewed Labs: ordered. Radiology: ordered. ECG/medicine tests: ordered.  Risk OTC drugs. Prescription drug management.   57 yo M with a chief complaints of needing dialysis.  The patient has not had dialysis in about 9 days.  He has been discharged from his dialysis unit in [facility] and is here to get dialysis.  His lab work is resulted with mild hyperkalemia 5.5.  Chest x-ray independently interpreted by me without overt fluid overload.  He is not exceptionally tachypneic he is not hypoxic.  He has a bicarb of 17.  BUN is mildly elevated.  I discussed the case with Dr. Melanee Spry, nephrology felt did not warrant emergent dialysis.  Recommended Lokelma x 4 days and bicarb for the next month.  He saw the patient at bedside and encouraged follow-up.  12:34 PM:  I have discussed the diagnosis/risks/treatment options with the patient.  Evaluation and diagnostic testing in the emergency department does not suggest an emergent condition requiring admission or immediate intervention beyond what has been performed at this time.  They will follow up with Nephrology. We also discussed returning to the ED immediately if new or worsening sx occur. We discussed  the sx which are most concerning (e.g., sudden worsening pain, fever, inability to tolerate by mouth, difficulty breathing) that necessitate immediate return. Medications administered to the patient during their visit and any new prescriptions provided to the patient are listed below.  Medications given during this visit Medications  sodium zirconium cyclosilicate (LOKELMA) packet 10 g (10 g Oral Given 05/08/23 1055)     The patient appears reasonably screen and/or stabilized for discharge and I doubt any other medical condition or other [facility] requiring further screening, evaluation, or treatment in the ED at this time prior to discharge.          Final Clinical Impression(s) / ED Diagnoses Final diagnoses:  ESRD (end stage renal disease) on dialysis [facility])    Rx / DC Orders ED Discharge Orders          Ordered    sodium zirconium cyclosilicate (LOKELMA) 10 g PACK packet  Daily        05/08/23 1203    sodium bicarbonate 650 MG tablet  2 times daily        05/08/23 1203              Melene Plan, DO 05/08/23 1234

## 2023-05-08 NOTE — ED Triage Notes (Signed)
Patient here for dialysis.  Patient reports he has not had dialysis in 9 days

## 2023-05-08 NOTE — Discharge Instructions (Signed)
Follow up with the dialysis center.  Return to an ED for worsening difficulty breathing, if you pass out

## 2023-07-16 ENCOUNTER — Ambulatory Visit: Payer: Medicare HMO | Admitting: Family Medicine

## 2023-07-28 ENCOUNTER — Ambulatory Visit: Payer: Medicare HMO | Admitting: Internal Medicine

## 2023-07-28 NOTE — Progress Notes (Deleted)
FOLLOW UP Date of Service/Encounter:  07/28/23  Subjective:  Gary Buchanan (DOB: August 15, 1966) is a 57 y.o. male with past medical history of end-stage renal disease who [facility] in re-evaluation of the following: Moderate persistent asthma, chronic rhinitis, atopic dermatitis History obtained from: chart review and {Persons; PED relatives w/patient:19415::"patient"}.  For Review, LV was on 07/23/22  with Dr. Dellis Anes seen for routine follow-up. See below for summary of history and diagnostics.   Therapeutic plans/changes recommended:  Visit he reported not doing well due to his apartment being contaminated.  Getting part of his care at [facility] and [facility] [place].  Reported being on Qvar, previously on Breo and Trelegy. He has a history of stage IV kidney disease. Has never had skin testing due to poor lung function at his previous allergy visits. Occasional throat drainage. Reported a eczematous patch on his left leg and behind his ear that were pruritic.  FEV1 40%. He was given a steroid burst, started on Trelegy and Xopenex.  Continued on flonase and Zyrtec added. --------------------------------------------------- Today presents for follow-up. ***  Chart Review: ***  All medications reviewed by clinical staff and updated in chart. No new pertinent medical or surgical history except as noted in HPI.  ROS: All others negative except as noted per HPI.   Objective:  There were no vitals taken for this visit. There is no height or weight on file to calculate BMI. Physical Exam: General Appearance:  Alert, cooperative, no distress, appears stated age  Head:  Normocephalic, without obvious abnormality, atraumatic  Eyes:  Conjunctiva clear, EOM's intact  Ears {Blank multiple:19196:a:"***","EACs normal bilaterally","normal TMs bilaterally","ear tubes present bilaterally without exudate"}  Nose: Nares normal, {Blank  multiple:19196:a:"***","hypertrophic turbinates","normal mucosa","no visible anterior polyps","septum midline"}  Throat: Lips, tongue normal; teeth and gums normal, {Blank multiple:19196:a:"***","normal posterior oropharynx","tonsils 2+","tonsils 3+","no tonsillar exudate","+ cobblestoning","surgically absent tonsils"}  Neck: Supple, symmetrical  Lungs:   {Blank multiple:19196:a:"***","clear to auscultation bilaterally","end-expiratory wheezing","wheezing throughout"}, Respirations unlabored, {Blank multiple:19196:a:"***","no coughing","intermittent dry coughing"}  Heart:  {Blank multiple:19196:a:"***","regular rate and rhythm","no murmur"}, Appears well perfused  Extremities: No edema  Skin: {Blank multiple:19196:a:"***","erythematous, dry patches scattered on ***","lichenification on ***","Skin color, texture, turgor normal","no rashes or lesions on visualized portions of skin"}  Neurologic: No gross deficits   Labs:  Lab Orders  No laboratory test(s) ordered today    Spirometry:  Tracings reviewed. His effort: {Blank single:19197::"Good reproducible efforts.","It was hard to get consistent efforts and there is a question as to whether this reflects a maximal maneuver.","Poor effort, data can not be interpreted.","Variable effort-results affected","effort okay for first attempt at spirometry.","Results not reproducible due to ***"} FVC: ***L FEV1: ***L, ***% predicted FEV1/FVC ratio: ***% Interpretation: {Blank single:19197::"Spirometry consistent with mild obstructive disease","Spirometry consistent with moderate obstructive disease","Spirometry consistent with severe obstructive disease","Spirometry consistent with possible restrictive disease","Spirometry consistent with mixed obstructive and restrictive disease","Spirometry uninterpretable due to technique","Spirometry consistent with normal pattern","No overt abnormalities noted given today's efforts","Nonobstructive ratio, low  FEV1","Nonobstructive ratio, low FEV1, possible restriction"}.  Please see scanned spirometry results for details.  Skin Testing: {Blank single:19197::"Select foods","Environmental allergy panel","Environmental allergy panel and select foods","Food allergy panel","None","Deferred due to recent antihistamines use","deferred due to recent reaction","Pediatric Environmental Allergy Panel","Pediatric Food Panel","Select foods and environmental allergies"}. {Blank single:19197::"Adequate positive and negative controls","Inadequate positive control-testing invalid","Adequate positive and negative controls, dermatographism present, testing difficult to interpret"}. Results discussed with patient/family.   {Blank single:19197::"Allergy testing results were read and interpreted by myself, documented by clinical staff.","Allergy testing results were read by ***,FNP,  documented by clinical staff"}  Assessment/Plan   ***  Other: {Blank multiple:19196:a:"***","samples provided of: ***","spacer provided in clinic","nebulizer machine provided in clinic","school forms provided","reviewed spirometry technique","reviewed inhaler technique","allergy injection given in clinic today","biologic given in clinic today"}  Tonny Bollman, MD  [facility]

## 2023-07-29 ENCOUNTER — Ambulatory Visit: Payer: Medicare HMO | Admitting: Internal Medicine

## 2023-08-06 ENCOUNTER — Ambulatory Visit: Payer: Medicare HMO | Admitting: Family

## 2023-08-06 ENCOUNTER — Ambulatory Visit (INDEPENDENT_AMBULATORY_CARE_PROVIDER_SITE_OTHER): Payer: Medicare HMO | Admitting: Family

## 2023-08-06 ENCOUNTER — Encounter: Payer: Self-pay | Admitting: Family

## 2023-08-06 VITALS — BP 130/82 | HR 78 | Temp 98.3°F | Resp 24 | Ht 62.0 in | Wt 208.0 lb

## 2023-08-06 DIAGNOSIS — R112 Nausea with vomiting, unspecified: Secondary | ICD-10-CM | POA: Diagnosis not present

## 2023-08-06 DIAGNOSIS — J4541 Moderate persistent asthma with (acute) exacerbation: Secondary | ICD-10-CM | POA: Diagnosis not present

## 2023-08-06 DIAGNOSIS — J302 Other seasonal allergic rhinitis: Secondary | ICD-10-CM

## 2023-08-06 DIAGNOSIS — J3089 Other allergic rhinitis: Secondary | ICD-10-CM

## 2023-08-06 MED ORDER — LEVALBUTEROL HCL 1.25 MG/3ML IN NEBU
1.2500 mg | INHALATION_SOLUTION | Freq: Once | RESPIRATORY_TRACT | Status: AC
Start: 2023-08-06 — End: 2023-08-06
  Administered 2023-08-06: 1.25 mg via RESPIRATORY_TRACT

## 2023-08-06 MED ORDER — MONTELUKAST SODIUM 10 MG PO TABS: 10.0000 mg | ORAL_TABLET | Freq: Every day | ORAL | 5 refills | Status: AC

## 2023-08-06 MED ORDER — PREDNISONE 10 MG PO TABS: ORAL_TABLET | ORAL | 0 refills | Status: AC

## 2023-08-06 MED ORDER — LEVALBUTEROL TARTRATE 45 MCG/ACT IN AERO: 2.0000 | INHALATION_SPRAY | Freq: Four times a day (QID) | RESPIRATORY_TRACT | 1 refills | Status: AC | PRN

## 2023-08-06 MED ORDER — TRELEGY ELLIPTA 200-62.5-25 MCG/ACT IN AEPB: INHALATION_SPRAY | RESPIRATORY_TRACT | 2 refills | Status: AC

## 2023-08-06 NOTE — Progress Notes (Signed)
[address] [place] [address] Dept: [phone]  FOLLOW UP NOTE  Patient ID: Gary Buchanan, male    DOB: 14-Jan-1966  Age: 57 y.o. MRN: [id number] Date of Office Visit: 08/06/2023  Assessment  Chief Complaint: Asthma (Shortness of breath due to mold exposure in apartment )  HPI Gary Buchanan is a 57 year old male who presents today for follow-up of moderate persistent asthma, chronic rhinitis, and eczema of left leg.  He was last seen on July 23, 2022 by Dr. Dellis Anes.  He does have a past medical history of open angle glaucoma, obstructive sleep apnea, hypertension, congestive heart failure, chronic A-fib, type 2 diabetes, and end-stage renal disease on hemodialysis.  Since his last office visit he has made multiple  visits to the emergency room for end-stage renal disease, exacerbation of asthma, nausea vomiting, viral upper respiratory infection, moderate persistent asthma with exacerbation, SVT converted after adenosine , and the hospital admission for community-acquired pneumonia.  Moderate persistent asthma: He reports that he is currently using an inhaler 1 puff once a day, that when asked if his Trelegy 100 mcg he says yes.  He also has a solid dark blue inhaler that is short that he does not know the name of.  He has a nebulizer that he uses albuterol with, but mentions he is not able to use it.  He does not have albuterol or Xopenex inhaler.  He reports that he has been suffering from mold exposure.  The mold is interfering with his breathing.  He would like a letter in regards to his transfer out of his apartment.  He reports that the vents blow out mold and mildew and feels like it is causing his asthma to flare.  He reports a productive cough with clear sputum, wheezing, and shortness of breath with exertion.  When asked about nocturnal awakenings due to breathing problems, he reports that he suffers from sleep apnea all night long.  He reports that at night he is fighting for air.   His CPAP is torn up, but he has not tried to get another one because water came up in the hose once and he almost "died in his sleep".  He denies tightness in his chest, fever, and chills. Since his last office visit he has made trips to the emergency room due to breathing problems and has been given steroids.He is unable to give a number  He reports that he gets dialysis there and they usually end up giving him something for his asthma.  He thinks it has been 1 to 2 weeks since he last had steroids.  Allergic rhinitis: His lab work and July 2023 was positive to dust mite and grass.  He currently restarted Allegra.  He just found this medication.  He does continue to take Singulair 10 mg once a day.  He reports that he has constant clear rhinorrhea.  He also has nasal congestion.  His eyes are itchy and water all the time.  He denies postnasal drip.  He has not had any sinus infections since we last saw him.  Eczema on the left leg: He reports the area that was on his leg from last year is gone.  He does have a cream that the hospital gave him recently for an area on his buttocks.  He denies heartburn or reflux symptoms.  He reports nausea and vomiting.  He reports that he throws up every day and this has been going on for a while.  He reports that the hospital usually gives him something.  He has never seen GI.   Drug Allergies:  Allergies  Allergen Reactions   Ace Inhibitors Swelling    Lips swelling   Combivent Respimat [Ipratropium-Albuterol] Palpitations   Latex Itching   Zestril [Lisinopril] Swelling    Lip swelling   Neurontin [Gabapentin] Other (See Comments)    Rapid heartbeat   Zanaflex [Tizanidine] Other (See Comments)    Chest pain    Review of Systems: Negative except as per HPI   Physical Exam: BP 130/82   Pulse 78   Temp 98.3 F (36.8 C) (Temporal)   Resp (!) 24   Ht 5\' 2"  (1.575 m)   Wt 208 lb (94.3 kg)   SpO2 96%   BMI 38.04 kg/m    Physical  Exam Constitutional:      Appearance: Normal appearance.  HENT:     Head: Normocephalic and atraumatic.     Comments: Pharynx normal, eyes normal, ears normal, nose: Bilateral lower turbinates moderately edematous with clear drainage noted    Right Ear: Tympanic membrane, ear canal and external ear normal.     Left Ear: Tympanic membrane, ear canal and external ear normal.     Mouth/Throat:     Mouth: Mucous membranes are moist.     Pharynx: Oropharynx is clear.  Eyes:     Conjunctiva/sclera: Conjunctivae normal.  Cardiovascular:     Rate and Rhythm: Regular rhythm.     Heart sounds: Normal heart sounds.  Pulmonary:     Comments: Rhonchi heard throughout all lung fields. Lungs clear after Xopenex nebulizer treatment. Musculoskeletal:     Cervical back: Neck supple.  Skin:    General: Skin is warm.  Neurological:     Mental Status: He is alert and oriented to person, place, and time.  Psychiatric:        Mood and Affect: Mood normal.        Behavior: Behavior normal.        Thought Content: Thought content normal.        Judgment: Judgment normal.     Diagnostics:  He reports that cannot do spirometry do to feeling nauseous. Will attempt at next office visit.  Assessment and Plan: 1. Moderate persistent asthma with exacerbation   2. Seasonal and perennial allergic rhinitis   3. Nausea and vomiting, unspecified vomiting type     Meds ordered this encounter  Medications   predniSONE (DELTASONE) 10 MG tablet    Sig: Take 2 tablets twice a day for 3 days, then on the 4th day take 2 tablets in the morning, and on the 5th day take one tablet and stop    Dispense:  15 tablet    Refill:  0   Fluticasone-Umeclidin-Vilant (TRELEGY ELLIPTA) 200-62.5-25 MCG/ACT AEPB    Sig: Inhale one puff once a day. Rinse mouth out after    Dispense:  28 each    Refill:  2   levalbuterol (XOPENEX HFA) 45 MCG/ACT inhaler    Sig: Inhale 2 puffs into the lungs every 6 (six) hours as needed for  wheezing.    Dispense:  1 each    Refill:  1    Has history of SVT and chronic A-fib   montelukast (SINGULAIR) 10 MG tablet    Sig: Take 1 tablet (10 mg total) by mouth at bedtime.    Dispense:  30 tablet    Refill:  5   levalbuterol (XOPENEX) nebulizer solution 1.25 mg  Patient Instructions  1. Moderate persistent asthma with acute exacerbation -Letter written for concerns with mold in his apartment. -Stop Trelegy 100 mcg -Stop dark blue inhaler (?) -Start prednisone 10 mg taking 2 tablets twice a day for 3 days, then on the 4th day take 2 tablets in the morning, and on the fifth day take one tablet and stop -Absolute eosinophil count on 05/08/23 was 0.5. Information on Fasenra given to help control asthma. Let us know if this is something you are interested in starting - aspergillus precipitins, ANCA titers, and alpha 1 antitrypsin normal from 07/23/22 lab work - Daily controller medication(s): Start Trelegy 200/62.5/25 one puff once daily - Prior to physical activity: Xopenex 2 puffs 10-15 minutes before physical activity. - Rescue medications: Xopenex 4 puffs every 4-6 hours as needed - Asthma control goals:  * Full participation in all desired activities (may need albuterol before activity) * Albuterol use two time or less a week on average (not counting use with activity) * Cough interfering with sleep two time or less a month * Oral steroids no more than once a year * No hospitalizations  2. Allergic rhinitis- nkot well controlled - lab work from 07/23/22 low to dust mite and grass - Continue with Singulair (montelukast) 10 mg daily. - Continue Allegra (fexofenadine)daily.   3. Eczema on the left leg-resolved   We will refer you to GI due to your daily nausea and vomiting Please bring all your medications with you to your next appointment so we can know that you are taking  4. Return in about 1-2 with Dr. Maurine Minister or Dr. Marlynn Perking weeks         Return in about 1  week (around 08/13/2023).    Thank you for the opportunity to care for this patient.  Please do not hesitate to contact me with questions.  Nehemiah Settle, FNP [facility]

## 2023-08-06 NOTE — Patient Instructions (Addendum)
1. Moderate persistent asthma with acute exacerbation -Letter written for concerns with mold in his apartment. -Stop Trelegy 100 mcg -Stop dark blue inhaler (?) -Start prednisone 10 mg taking 2 tablets twice a day for 3 days, then on the 4th day take 2 tablets in the morning, and on the fifth day take one tablet and stop -Absolute eosinophil count on 05/08/23 was 0.5. Information on Fasenra given to help control asthma. Let us know if this is something you are interested in starting - aspergillus precipitins, ANCA titers, and alpha 1 antitrypsin normal from 07/23/22 lab work - Daily controller medication(s): Start Trelegy 200/62.5/25 one puff once daily - Prior to physical activity: Xopenex 2 puffs 10-15 minutes before physical activity. - Rescue medications: Xopenex 4 puffs every 4-6 hours as needed - Asthma control goals:  * Full participation in all desired activities (may need albuterol before activity) * Albuterol use two time or less a week on average (not counting use with activity) * Cough interfering with sleep two time or less a month * Oral steroids no more than once a year * No hospitalizations  2. Allergic rhinitis- nkot well controlled - lab work from 07/23/22 low to dust mite and grass - Continue with Singulair (montelukast) 10 mg daily. - Continue Allegra (fexofenadine)daily.   3. Eczema on the left leg-resolved   We will refer you to GI due to your daily nausea and vomiting Please bring all your medications with you to your next appointment so we can know that you are taking  4. Return in about 1-2 with Dr. Maurine Minister or Dr. Marlynn Perking weeks

## 2023-08-09 ENCOUNTER — Telehealth: Payer: Self-pay

## 2023-08-09 NOTE — Telephone Encounter (Signed)
*  [employer]  Pharmacy Patient Advocate Encounter   Received notification from CoverMyMeds that prior authorization for Levalbuterol Tartrate 45MCG/ACT aerosol  is required/requested.   Insurance verification completed.   The patient is insured through [employer] .   Per test claim: APPROVED from 08/09/2023 to 12/27/2023  [facility]: [id number]

## 2023-08-12 ENCOUNTER — Other Ambulatory Visit: Payer: Self-pay | Admitting: Allergy & Immunology

## 2023-08-17 ENCOUNTER — Telehealth: Payer: Self-pay

## 2023-08-17 NOTE — Telephone Encounter (Signed)
Thank you Dee

## 2023-08-17 NOTE — Telephone Encounter (Signed)
Patient is scheduled to see Clovis Pu, PA on 09/23/2023 @ 10:00 AM.   [facility] [address] [place], [place] [address] Phone: [phone] Fax: [phone]  I called and left a detailed voicemail for the patient. I also sent a letter to the address on file with this information.

## 2023-08-17 NOTE — Telephone Encounter (Signed)
-----   Message from Nehemiah Settle sent at 08/06/2023 10:56 AM EDT ----- Please refer to GI due to daily nausea and vomiting

## 2023-08-19 NOTE — Progress Notes (Deleted)
FOLLOW UP Date of Service/Encounter:  08/19/23  Subjective:  Gary Buchanan (DOB: February 12, 1966) is a 57 y.o. male who [facility]23 in re-evaluation of the following: *** History obtained from: chart review and {Persons; PED relatives w/patient:19415::"patient"}.  For Review, LV was on ***  with {Blank single:19197::"Dr. Kim","Dr. Kozlow","Dr. Bardelas","Dr. Bobbitt","Dr. Gallagher","Dr. Padgett","Dr. Zerita Boers, FNP","Christine Amada Jupiter, FNP","Dr. Lomasney","Dr.Delray Reza"} seen for {Blank single:19197::"intial visit for ***","routine follow-up","acute visit for ***"}. See below for summary of history and diagnostics.   Therapeutic plans/changes recommended: *** ----------------------------------------------------- Pertinent History/Diagnostics:  {Blank single:19197::"Asthma","History of Wheezing","Chronic cough","Reactive Airway Disease","***"}: *** - *** spirometry (***): ratio ***, *** FEV1 (pre), + *** FEV1 (post) {Blank single:19197::"Allergic","Nonallergic","Chronic","***"} Rhinitis:  *** - SPT environmental panel (***): *** {Blank single:19197::"Food Allergy","Food Intolerance","Adverse Food Reaction","***"}:  *** - SPT select foods (***): *** Eczema: *** {Blank single:19197::"H/o Penicillin Allergy","Venom allergy","Recurrent infections"}: *** Urticaria:  *** - labs (***) Other: *** --------------------------------------------------- Today presents for follow-up. ***  Chart Review: ***  All medications reviewed by clinical staff and updated in chart. No new pertinent medical or surgical history except as noted in HPI.  ROS: All others negative except as noted per HPI.   Objective:  There were no vitals taken for this visit. There is no height or weight on file to calculate BMI. Physical Exam: General Appearance:  Alert, cooperative, no distress, appears stated age  Head:  Normocephalic, without obvious abnormality,  atraumatic  Eyes:  Conjunctiva clear, EOM's intact  Ears {Blank multiple:19196:a:"***","EACs normal bilaterally","normal TMs bilaterally","ear tubes present bilaterally without exudate"}  Nose: Nares normal, {Blank multiple:19196:a:"***","hypertrophic turbinates","normal mucosa","no visible anterior polyps","septum midline"}  Throat: Lips, tongue normal; teeth and gums normal, {Blank multiple:19196:a:"***","normal posterior oropharynx","tonsils 2+","tonsils 3+","no tonsillar exudate","+ cobblestoning","surgically absent tonsils"}  Neck: Supple, symmetrical  Lungs:   {Blank multiple:19196:a:"***","clear to auscultation bilaterally","end-expiratory wheezing","wheezing throughout"}, Respirations unlabored, {Blank multiple:19196:a:"***","no coughing","intermittent dry coughing"}  Heart:  {Blank multiple:19196:a:"***","regular rate and rhythm","no murmur"}, Appears well perfused  Extremities: No edema  Skin: {Blank multiple:19196:a:"***","erythematous, dry patches scattered on ***","lichenification on ***","Skin color, texture, turgor normal","no rashes or lesions on visualized portions of skin"}  Neurologic: No gross deficits   Labs:  Lab Orders  No laboratory test(s) ordered today    Spirometry:  Tracings reviewed. His effort: {Blank single:19197::"Good reproducible efforts.","It was hard to get consistent efforts and there is a question as to whether this reflects a maximal maneuver.","Poor effort, data can not be interpreted.","Variable effort-results affected","effort okay for first attempt at spirometry.","Results not reproducible due to ***"} FVC: ***L FEV1: ***L, ***% predicted FEV1/FVC ratio: ***% Interpretation: {Blank single:19197::"Spirometry consistent with mild obstructive disease","Spirometry consistent with moderate obstructive disease","Spirometry consistent with severe obstructive disease","Spirometry consistent with possible restrictive disease","Spirometry consistent with mixed  obstructive and restrictive disease","Spirometry uninterpretable due to technique","Spirometry consistent with normal pattern","No overt abnormalities noted given today's efforts","Nonobstructive ratio, low FEV1","Nonobstructive ratio, low FEV1, possible restriction"}.  Please see scanned spirometry results for details.  Skin Testing: {Blank single:19197::"Select foods","Environmental allergy panel","Environmental allergy panel and select foods","Food allergy panel","None","Deferred due to recent antihistamines use","deferred due to recent reaction","Pediatric Environmental Allergy Panel","Pediatric Food Panel","Select foods and environmental allergies"}. {Blank single:19197::"Adequate positive and negative controls","Inadequate positive control-testing invalid","Adequate positive and negative controls, dermatographism present, testing difficult to interpret"}. Results discussed with patient/family.   {Blank single:19197::"Allergy testing results were read and interpreted by myself, documented by clinical staff.","Allergy testing results were read by ***,FNP, documented by clinical staff"}  Assessment/Plan   ***  Other: {Blank multiple:19196:a:"***","samples provided of: ***","spacer provided in clinic","nebulizer machine provided in clinic","school  forms provided","reviewed spirometry technique","reviewed inhaler technique","allergy injection given in clinic today","biologic given in clinic today"}  Tonny Bollman, MD  [facility]

## 2023-08-20 ENCOUNTER — Ambulatory Visit: Payer: Medicare HMO | Admitting: Internal Medicine

## 2023-08-24 ENCOUNTER — Ambulatory Visit: Payer: Medicare HMO | Admitting: Family Medicine

## 2023-08-25 ENCOUNTER — Ambulatory Visit: Payer: Medicare HMO | Admitting: Podiatry

## 2023-09-15 ENCOUNTER — Ambulatory Visit (INDEPENDENT_AMBULATORY_CARE_PROVIDER_SITE_OTHER): Payer: Medicare HMO | Admitting: Podiatry

## 2023-09-15 DIAGNOSIS — Z91199 Patient's noncompliance with other medical treatment and regimen due to unspecified reason: Secondary | ICD-10-CM

## 2023-09-19 NOTE — Progress Notes (Signed)
1. No-show for appointment   Patient in hospital.

## 2023-10-12 ENCOUNTER — Encounter (INDEPENDENT_AMBULATORY_CARE_PROVIDER_SITE_OTHER): Payer: Medicare HMO | Admitting: Sports Medicine

## 2023-10-13 NOTE — Progress Notes (Signed)
This encounter was created in error - please disregard.

## 2023-10-29 ENCOUNTER — Ambulatory Visit: Payer: Medicare HMO | Admitting: Family
